# Patient Record
Sex: Male | Born: 1970 | Race: Black or African American | Hispanic: No | Marital: Married | Smoking: Former smoker
Health system: Southern US, Community
[De-identification: ages and names within clinical notes are randomized; demographics above are authoritative.]

## PROBLEM LIST (undated history)

## (undated) DIAGNOSIS — Z91018 Allergy to other foods: Secondary | ICD-10-CM

## (undated) DIAGNOSIS — G4730 Sleep apnea, unspecified: Secondary | ICD-10-CM

## (undated) DIAGNOSIS — M549 Dorsalgia, unspecified: Secondary | ICD-10-CM

## (undated) DIAGNOSIS — M1990 Unspecified osteoarthritis, unspecified site: Secondary | ICD-10-CM

## (undated) DIAGNOSIS — E785 Hyperlipidemia, unspecified: Secondary | ICD-10-CM

## (undated) DIAGNOSIS — R7303 Prediabetes: Secondary | ICD-10-CM

## (undated) DIAGNOSIS — R600 Localized edema: Secondary | ICD-10-CM

## (undated) DIAGNOSIS — T7840XA Allergy, unspecified, initial encounter: Secondary | ICD-10-CM

## (undated) DIAGNOSIS — I10 Essential (primary) hypertension: Secondary | ICD-10-CM

## (undated) DIAGNOSIS — L309 Dermatitis, unspecified: Secondary | ICD-10-CM

## (undated) HISTORY — DX: Prediabetes: R73.03

## (undated) HISTORY — PX: MOUTH SURGERY: SHX715

## (undated) HISTORY — DX: Sleep apnea, unspecified: G47.30

## (undated) HISTORY — DX: Morbid (severe) obesity due to excess calories: E66.01

## (undated) HISTORY — DX: Allergy to other foods: Z91.018

## (undated) HISTORY — DX: Allergy, unspecified, initial encounter: T78.40XA

## (undated) HISTORY — DX: Dermatitis, unspecified: L30.9

## (undated) HISTORY — DX: Unspecified osteoarthritis, unspecified site: M19.90

## (undated) HISTORY — DX: Localized edema: R60.0

## (undated) HISTORY — DX: Dorsalgia, unspecified: M54.9

## (undated) HISTORY — DX: Hyperlipidemia, unspecified: E78.5

## (undated) HISTORY — PX: NO PAST SURGERIES: SHX2092

---

## 1998-09-10 ENCOUNTER — Encounter: Payer: Self-pay | Admitting: Emergency Medicine

## 1998-09-10 ENCOUNTER — Emergency Department (HOSPITAL_COMMUNITY): Admission: EM | Admit: 1998-09-10 | Discharge: 1998-09-10 | Payer: Self-pay | Admitting: Emergency Medicine

## 1999-03-22 ENCOUNTER — Emergency Department (HOSPITAL_COMMUNITY): Admission: EM | Admit: 1999-03-22 | Discharge: 1999-03-22 | Payer: Self-pay | Admitting: Emergency Medicine

## 1999-05-30 ENCOUNTER — Emergency Department (HOSPITAL_COMMUNITY): Admission: EM | Admit: 1999-05-30 | Discharge: 1999-05-30 | Payer: Self-pay | Admitting: Emergency Medicine

## 2000-08-30 ENCOUNTER — Encounter: Payer: Self-pay | Admitting: Emergency Medicine

## 2000-08-30 ENCOUNTER — Emergency Department (HOSPITAL_COMMUNITY): Admission: EM | Admit: 2000-08-30 | Discharge: 2000-08-30 | Payer: Self-pay | Admitting: Emergency Medicine

## 2003-06-06 ENCOUNTER — Emergency Department (HOSPITAL_COMMUNITY): Admission: EM | Admit: 2003-06-06 | Discharge: 2003-06-06 | Payer: Self-pay | Admitting: Emergency Medicine

## 2004-02-11 ENCOUNTER — Emergency Department (HOSPITAL_COMMUNITY): Admission: EM | Admit: 2004-02-11 | Discharge: 2004-02-11 | Payer: Self-pay | Admitting: Family Medicine

## 2004-04-20 ENCOUNTER — Emergency Department (HOSPITAL_COMMUNITY): Admission: EM | Admit: 2004-04-20 | Discharge: 2004-04-20 | Payer: Self-pay

## 2004-10-03 ENCOUNTER — Ambulatory Visit (HOSPITAL_BASED_OUTPATIENT_CLINIC_OR_DEPARTMENT_OTHER): Admission: RE | Admit: 2004-10-03 | Discharge: 2004-10-03 | Payer: Self-pay | Admitting: Family Medicine

## 2004-10-03 ENCOUNTER — Ambulatory Visit: Payer: Self-pay | Admitting: Internal Medicine

## 2005-07-07 ENCOUNTER — Ambulatory Visit (HOSPITAL_BASED_OUTPATIENT_CLINIC_OR_DEPARTMENT_OTHER): Admission: RE | Admit: 2005-07-07 | Discharge: 2005-07-07 | Payer: Self-pay | Admitting: Family Medicine

## 2005-07-12 ENCOUNTER — Ambulatory Visit: Payer: Self-pay | Admitting: Internal Medicine

## 2005-09-27 ENCOUNTER — Emergency Department (HOSPITAL_COMMUNITY): Admission: EM | Admit: 2005-09-27 | Discharge: 2005-09-27 | Payer: Self-pay | Admitting: Emergency Medicine

## 2006-06-27 ENCOUNTER — Emergency Department (HOSPITAL_COMMUNITY): Admission: EM | Admit: 2006-06-27 | Discharge: 2006-06-27 | Payer: Self-pay | Admitting: Emergency Medicine

## 2007-02-21 ENCOUNTER — Emergency Department (HOSPITAL_COMMUNITY): Admission: EM | Admit: 2007-02-21 | Discharge: 2007-02-21 | Payer: Self-pay | Admitting: Emergency Medicine

## 2007-02-27 ENCOUNTER — Emergency Department (HOSPITAL_COMMUNITY): Admission: EM | Admit: 2007-02-27 | Discharge: 2007-02-28 | Payer: Self-pay | Admitting: Emergency Medicine

## 2007-09-19 IMAGING — CR DG CHEST 2V
2 series · 2 of 2 positions shown · non-contrast
Comparison: 06/27/06.

CLINICAL DATA: Short of breath. 
 CHEST - 2 VIEW:

[w chest pa]
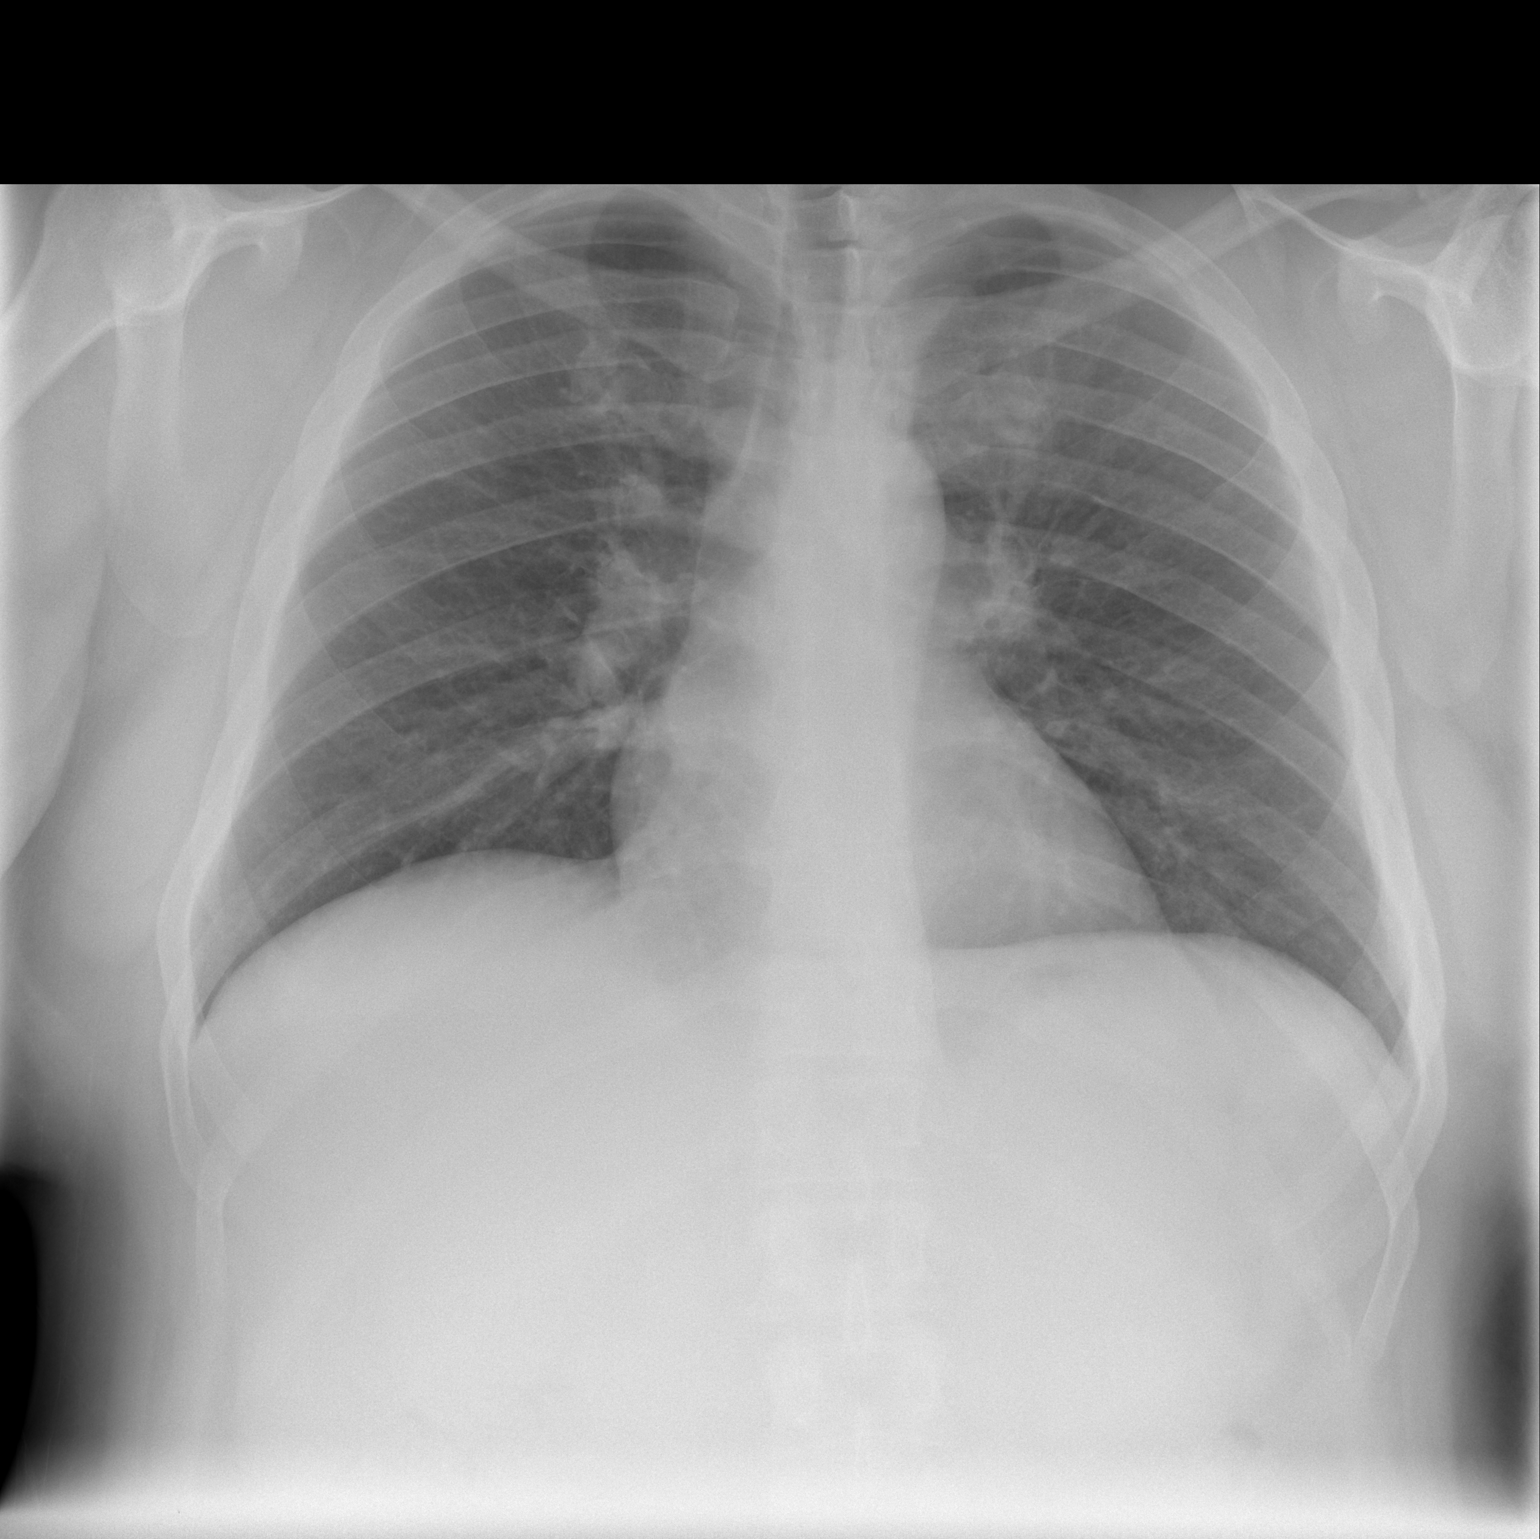

[w chest lat]
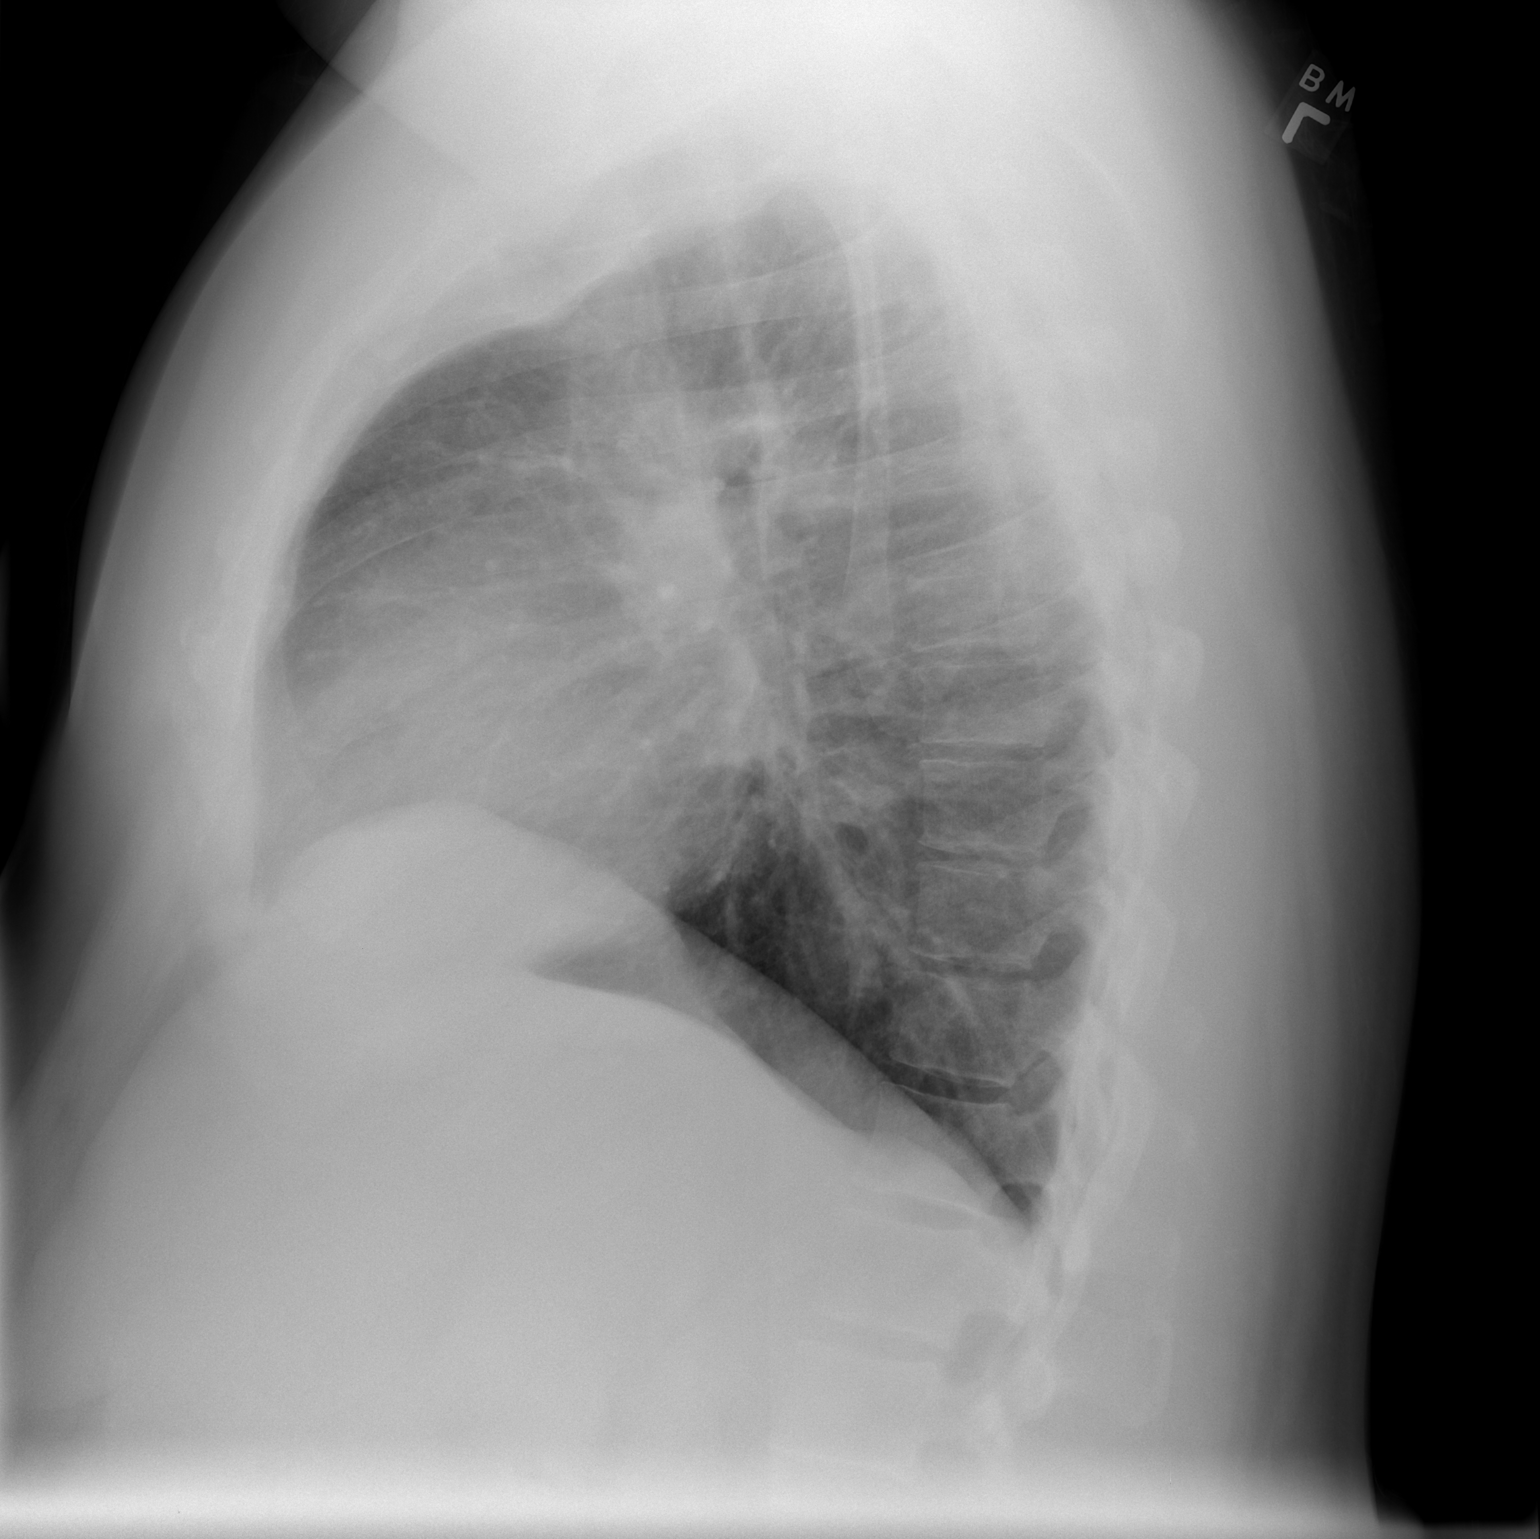

[2 of 2 positions shown; findings below may reference images not displayed]

FINDINGS: The heart size and mediastinal contours are within normal limits.  Both lungs are clear.  The visualized skeletal structures are within normal limits.
IMPRESSION: No active cardiopulmonary disease.

## 2010-04-08 ENCOUNTER — Ambulatory Visit (HOSPITAL_BASED_OUTPATIENT_CLINIC_OR_DEPARTMENT_OTHER): Admission: RE | Admit: 2010-04-08 | Discharge: 2010-04-08 | Payer: Self-pay | Admitting: Family Medicine

## 2010-04-13 ENCOUNTER — Ambulatory Visit: Payer: Self-pay | Admitting: Internal Medicine

## 2011-02-27 NOTE — Procedures (Signed)
NAME:  Troy Olson, Troy Olson                 ACCOUNT NO.:  [id number]   MEDICAL RECORD NO.:  [id number]          PATIENT TYPE:  OUT   LOCATION:  SLEEP CENTER                 FACILITY:  [facility]   PHYSICIAN:  Clinton D. Maple Hudson, M.D. DATE OF BIRTH:  [birth year]   DATE OF STUDY:  10/03/2004                              NOCTURNAL POLYSOMNOGRAM   INDICATIONS FOR STUDY:  Hypersomnia with sleep apnea. Epworth sleepiness  score 9/24, neck size 19 inches, BMI 42.3, weight 280 pounds.   SLEEP ARCHITECTURE:  Total sleep time 426 minutes with sleep efficiency of  93%. Stage I was 7%, stage II was 81%, stages III and IV were 3%, REM was  10% of total sleep time. Latency to sleep onset 9 minutes. Latency to REM 74  minutes. Awake after sleep onset 24 minutes. Arousal index 36. No sleep  medications.   RESPIRATORY DATA:  NPSG protocol. RDI 90.7 obstructive events per hour  indicating very severe obstructive sleep apnea/hypopnea syndrome. This  included 446 obstructive apneas, 198 hypopneas. Events were not positional  with most sleep on back and right side. REM RDI 94.3.   OXYGEN DATA:  Moderately loud snoring with oxygen desaturation to a nadir of  54% with apneas, mean oxygen saturation through the study was 82% to 90% on  room air.   CARDIAC DATA:  Normal sinus rhythm with occasional PVCs.   MOVEMENT/PARASOMNIA:  No significant sleep disturbance by limb jerks.  Bathroom times one.   IMPRESSION/RECOMMENDATIONS:  Very severe obstructive sleep apnea/hypopnea  syndrome, respiratory disturbance index 90.7 per hour with significant  oxygen desaturation to 54% on room air.  Consider early return for CPAP  titration or evaluation for alternative therapies as appropriate.                                                           Clinton D. Maple Hudson, M.D.  Diplomate, [employer] Board   CDY/MEDQ  D:  10/12/2004 12:06:39  T:  10/12/2004 13:59:08  Job:  [id number]

## 2011-02-27 NOTE — Procedures (Signed)
NAME:  Troy Olson, Troy Olson                 ACCOUNT NO.:  [id number]   MEDICAL RECORD NO.:  [id number]          PATIENT TYPE:  OUT   LOCATION:  SLEEP CENTER                 FACILITY:  [facility]   PHYSICIAN:  Clinton D. Maple Hudson, M.D. DATE OF BIRTH:  08-14-1971   DATE OF STUDY:  07/07/2005                              NOCTURNAL POLYSOMNOGRAM   REFERRING PHYSICIAN:  Dr. Blair Heys.   DATE OF STUDY:  July 07, 2005.   INDICATION FOR STUDY:  Hypersomnia with sleep apnea. Epworth sleepiness  score 11/24, BMI 45. Weight 300 pounds. Baseline diagnostic NPSG on October 03, 2004 reported an AHI of 90.7 per hour. C-PAP titration is requested.   SLEEP ARCHITECTURE:  Total sleep time 325 minutes with sleep efficiency 81%.  Stage I was 8%, stage II 46%, stages III and IV 12%, REM 34% of total sleep  time. Sleep latency 11 minutes, awake after sleep onset 67 minutes, arousal  index 29 per hour.   RESPIRATORY DATA:  C-PAP titration protocol. C-PAP was titrated to 22 CWP. A  pressure of 20 CWP appears adequate and provided an apnea/hypopnea index of  0 per hour. A large ResMed Ultra Mirage full face mask was used with heated  humidifier. He had problems with nasal dryness and congestion.   OXYGEN DATA:  Loud snoring before C-PAP. Snoring was prevented and oxygen  saturation was held 95-97% on room air with C-PAP control.   CARDIAC DATA:  Occasional PAC.   MOVEMENT/PARASOMNIA:  Occasional leg jerk with little effect on sleep.   IMPRESSION/RECOMMENDATIONS:  1.  Successful C-PAP titration to a recommended initial pressure of 20 CWP,      AHI 0 per hour. A large ResMed Ultra Mirage full face mask was used with      heated humidifier. He may need additional management of nasal      congestion.  2.  Baseline diagnostic NPSG on October 03, 2004 AHI 90.7 per hour.     Clinton D. Maple Hudson, M.D.  Diplomate, Biomedical engineer of Sleep Medicine  Electronically Signed    CDY/MEDQ  D:  07/12/2005 11:18:16  T:   07/12/2005 22:04:10  Job:  [id number]

## 2012-01-18 ENCOUNTER — Emergency Department (HOSPITAL_COMMUNITY)
Admission: EM | Admit: 2012-01-18 | Discharge: 2012-01-18 | Disposition: A | Discharge status: Home / Self Care | Payer: 59 | Attending: Emergency Medicine | Admitting: Emergency Medicine

## 2012-01-18 ENCOUNTER — Other Ambulatory Visit: Payer: Self-pay

## 2012-01-18 ENCOUNTER — Emergency Department (HOSPITAL_COMMUNITY): Payer: 59

## 2012-01-18 ENCOUNTER — Encounter (HOSPITAL_COMMUNITY): Payer: Self-pay | Admitting: *Deleted

## 2012-01-18 DIAGNOSIS — J3489 Other specified disorders of nose and nasal sinuses: Secondary | ICD-10-CM | POA: Insufficient documentation

## 2012-01-18 DIAGNOSIS — Z87891 Personal history of nicotine dependence: Secondary | ICD-10-CM | POA: Insufficient documentation

## 2012-01-18 DIAGNOSIS — R61 Generalized hyperhidrosis: Secondary | ICD-10-CM | POA: Insufficient documentation

## 2012-01-18 DIAGNOSIS — R079 Chest pain, unspecified: Secondary | ICD-10-CM | POA: Insufficient documentation

## 2012-01-18 DIAGNOSIS — R0602 Shortness of breath: Secondary | ICD-10-CM | POA: Insufficient documentation

## 2012-01-18 DIAGNOSIS — J45909 Unspecified asthma, uncomplicated: Secondary | ICD-10-CM | POA: Insufficient documentation

## 2012-01-18 DIAGNOSIS — L299 Pruritus, unspecified: Secondary | ICD-10-CM | POA: Insufficient documentation

## 2012-01-18 LAB — POCT I-STAT, CHEM 8
BUN: 11 mg/dL (ref 6–23)
Calcium, Ion: 1.15 mmol/L (ref 1.12–1.32)
Chloride: 106 mEq/L (ref 96–112)
Creatinine, Ser: 1 mg/dL (ref 0.50–1.35)
Glucose, Bld: 96 mg/dL (ref 70–99)
HCT: 42 % (ref 39.0–52.0)
Hemoglobin: 14.3 g/dL (ref 13.0–17.0)
Potassium: 3.3 mEq/L — ABNORMAL LOW (ref 3.5–5.1)
Sodium: 142 mEq/L (ref 135–145)
TCO2: 26 mmol/L (ref 0–100)

## 2012-01-18 LAB — POCT I-STAT TROPONIN I: Troponin i, poc: 0 ng/mL (ref 0.00–0.08)

## 2012-01-18 MED ORDER — ALBUTEROL SULFATE HFA 108 (90 BASE) MCG/ACT IN AERS
2.0000 | INHALATION_SPRAY | Freq: Once | RESPIRATORY_TRACT | Status: AC
  Administered 2012-01-18: 2 via RESPIRATORY_TRACT
  Filled 2012-01-18: qty 6.7

## 2012-01-18 NOTE — ED Notes (Signed)
Pt c/o on-going sharp, intermittent chest pain. Pt states last night he experienced profuse sweating and chest pain while playing a video game w/ son. Afterwards pt became nauseated and could not eat. Pt states this morning he just "feels worse than normal while driving to work". Pt states now he feels better than when he came into the ED.

## 2012-01-18 NOTE — ED Provider Notes (Signed)
History     CSN: [id number]  Arrival date & time 01/18/12  0601   None     Chief Complaint  Patient presents with  . Chest Pain    L ribs, sharp pain, intermittant    (Consider location/radiation/quality/duration/timing/severity/associated sxs/prior treatment) Patient is a 41 y.o. male presenting with shortness of breath. The history is provided by the patient.  Shortness of Breath  The current episode started more than 2 weeks ago. The problem occurs continuously. The problem has been gradually worsening. Associated symptoms include chest pain and shortness of breath. Pertinent negatives include no fever and no cough.  Pt states he has developed shortness of breath about a month ago. States feels short of breath at all times, feels like he has to take in deep breaths constantly to get enough air. Intermittently gets sharp pains in left ribs, non exertional, not associated with shortness of breath, dizziness, nausea, diaphoresis. Pt states he also had two episodes where he suddenly became diaphoretic, and nauseated, once last night, and once this morning. Both at rest, lasting few seconds- minutes and resolved on its own. Pt states he went to see his doctor when this started, had blood work done and was told everything was OK. Pt states he was exercising and doing "insanity" workouts prior to this, and now he is unable to do anything without getting short of breath. He has hx of seasonal allergies and asthma. Taking claritin,symbicort,  and albuterol daily  Past Medical History  Diagnosis Date  . Asthma     History reviewed. No pertinent past surgical history.  History reviewed. No pertinent family history.  History  Substance Use Topics  . Smoking status: Former Smoker    Quit date: 02/17/2011  . Smokeless tobacco: Not on file  . Alcohol Use: Yes     occasionally      Review of Systems  Constitutional: Positive for diaphoresis. Negative for fever and chills.  HENT: Positive  for congestion.   Eyes: Positive for itching.  Respiratory: Positive for shortness of breath. Negative for cough and chest tightness.   Cardiovascular: Positive for chest pain. Negative for leg swelling.  Gastrointestinal: Negative.   Genitourinary: Negative.   Musculoskeletal: Negative.   Skin: Negative.   Neurological: Negative.   Psychiatric/Behavioral: Negative.     Allergies  Food allergy formula and Peanut-containing drug products  Home Medications   Current Outpatient Rx  Name Route Sig Dispense Refill  . ALBUTEROL SULFATE HFA 108 (90 BASE) MCG/ACT IN AERS Inhalation Inhale 2 puffs into the lungs every 6 (six) hours as needed. wheezing    . BUDESONIDE-FORMOTEROL FUMARATE 160-4.5 MCG/ACT IN AERO Inhalation Inhale 2 puffs into the lungs 2 (two) times daily.    Marland Kitchen LORATADINE 10 MG PO TABS Oral Take 10 mg by mouth daily.      BP 138/65  Pulse 87  Temp(Src) 98.3 F (36.8 C) (Oral)  Resp 18  Ht 5\' 8"  (1.727 m)  Wt 285 lb (129.275 kg)  BMI 43.33 kg/m2  SpO2 100%  Physical Exam  Nursing note and vitals reviewed. Constitutional: He is oriented to person, place, and time. He appears well-developed and well-nourished. No distress.  HENT:  Head: Normocephalic and atraumatic.  Nose: Rhinorrhea present.  Mouth/Throat: Uvula is midline, oropharynx is clear and moist and mucous membranes are normal.  Eyes: Conjunctivae are normal. Pupils are equal, round, and reactive to light.  Neck: Normal range of motion. Neck supple.  Cardiovascular: Normal rate, regular rhythm and normal  heart sounds.   Pulmonary/Chest: Effort normal and breath sounds normal. No respiratory distress. He has no wheezes. He has no rales. He exhibits no tenderness.  Abdominal: Soft. Bowel sounds are normal. There is no tenderness.  Musculoskeletal: Normal range of motion. He exhibits no edema.  Lymphadenopathy:    He has no cervical adenopathy.  Neurological: He is alert and oriented to person, place, and  time.  Skin: Skin is warm and dry.  Psychiatric: He has a normal mood and affect.    ED Course  Procedures (including critical care time)  Pt with SOB. Pt appears very congested, unable to currently breathe through his nose. As I am examining, he takes deep breaths every few words. His vital signs are all within normal. He is PERC negative. Low risk for PE/DVT. He does have hx of asthma and seasonal allergies, and I think this is an exacerbation. Will check CXR, ecg, istat chem 8, troponin.   Date: 01/18/2012  Rate: 88  Rhythm: normal sinus rhythm  QRS Axis: left  Intervals: normal  ST/T Wave abnormalities: normal  Conduction Disutrbances:none  Narrative Interpretation:   Old EKG Reviewed: none available  Results for orders placed during the hospital encounter of 01/18/12  POCT I-STAT, CHEM 8      Component Value Range   Sodium 142  135 - 145 (mEq/L)   Potassium 3.3 (*) 3.5 - 5.1 (mEq/L)   Chloride 106  96 - 112 (mEq/L)   BUN 11  6 - 23 (mg/dL)   Creatinine, Ser 0.86  0.50 - 1.35 (mg/dL)   Glucose, Bld 96  70 - 99 (mg/dL)   Calcium, Ion 5.78  4.69 - 1.32 (mmol/L)   TCO2 26  0 - 100 (mmol/L)   Hemoglobin 14.3  13.0 - 17.0 (g/dL)   HCT 62.9  52.8 - 41.3 (%)  POCT I-STAT TROPONIN I      Component Value Range   Troponin i, poc 0.00  0.00 - 0.08 (ng/mL)   Comment 3            Dg Chest 2 View  01/18/2012  *RADIOLOGY REPORT*  Clinical Data: Left lateral chest pain for 2 days.  History of asthma.  CHEST - 2 VIEW  Comparison: 10/16/2010  Findings: Midline trachea.  Normal heart size and mediastinal contours. No pleural effusion or pneumothorax.  Clear lungs.  IMPRESSION: Normal chest.  Original Report Authenticated By: Consuello Bossier, M.D.   8:53 AM PT is CP free, VS normal, oxygen 100% on RA, he is in no respiratory distress. Do not think SOB is cardiac. He has no symptoms while exercising. Will d/c home with PCP follow up for possible stress test.   1. Chest pain       MDM           Lottie Mussel, PA 01/18/12 1600

## 2012-01-18 NOTE — Discharge Instructions (Signed)
Continue claritin and flonase daily. Make sure to eat healthy, exercise. Follow up with primary care doctor for further evaluation and treatment.  Chest Pain (Nonspecific) It is often hard to give a specific diagnosis for the cause of chest pain. There is always a chance that your pain could be related to something serious, such as a heart attack or a blood clot in the lungs. You need to follow up with your caregiver for further evaluation. CAUSES   Heartburn.   Pneumonia or bronchitis.   Anxiety or stress.   Inflammation around your heart (pericarditis) or lung (pleuritis or pleurisy).   A blood clot in the lung.   A collapsed lung (pneumothorax). It can develop suddenly on its own (spontaneous pneumothorax) or from injury (trauma) to the chest.   Shingles infection (herpes zoster virus).  The chest wall is composed of bones, muscles, and cartilage. Any of these can be the source of the pain.  The bones can be bruised by injury.   The muscles or cartilage can be strained by coughing or overwork.   The cartilage can be affected by inflammation and become sore (costochondritis).  DIAGNOSIS  Lab tests or other studies, such as X-rays, electrocardiography, stress testing, or cardiac imaging, may be needed to find the cause of your pain.  TREATMENT   Treatment depends on what may be causing your chest pain. Treatment may include:   Acid blockers for heartburn.   Anti-inflammatory medicine.   Pain medicine for inflammatory conditions.   Antibiotics if an infection is present.   You may be advised to change lifestyle habits. This includes stopping smoking and avoiding alcohol, caffeine, and chocolate.   You may be advised to keep your head raised (elevated) when sleeping. This reduces the chance of acid going backward from your stomach into your esophagus.   Most of the time, nonspecific chest pain will improve within 2 to 3 days with rest and mild pain medicine.  HOME CARE  INSTRUCTIONS   If antibiotics were prescribed, take your antibiotics as directed. Finish them even if you start to feel better.   For the next few days, avoid physical activities that bring on chest pain. Continue physical activities as directed.   Do not smoke.   Avoid drinking alcohol.   Only take over-the-counter or prescription medicine for pain, discomfort, or fever as directed by your caregiver.   Follow your caregiver's suggestions for further testing if your chest pain does not go away.   Keep any follow-up appointments you made. If you do not go to an appointment, you could develop lasting (chronic) problems with pain. If there is any problem keeping an appointment, you must call to reschedule.  SEEK MEDICAL CARE IF:   You think you are having problems from the medicine you are taking. Read your medicine instructions carefully.   Your chest pain does not go away, even after treatment.   You develop a rash with blisters on your chest.  SEEK IMMEDIATE MEDICAL CARE IF:   You have increased chest pain or pain that spreads to your arm, neck, jaw, back, or abdomen.   You develop shortness of breath, an increasing cough, or you are coughing up blood.   You have severe back or abdominal pain, feel nauseous, or vomit.   You develop severe weakness, fainting, or chills.   You have a fever.  THIS IS AN EMERGENCY. Do not wait to see if the pain will go away. Get medical help at once. Call  your local emergency services (911 in [place] Do not drive yourself to the hospital. MAKE SURE YOU:   Understand these instructions.   Will watch your condition.   Will get help right away if you are not doing well or get worse.  Document Released: 07/08/2005 Document Revised: 09/17/2011 Document Reviewed: 05/03/2008 [facility] Patient Information 2012 [place], [place].

## 2012-01-18 NOTE — ED Notes (Signed)
Bed:WA03<BR> Expected date:<BR> Expected time:<BR> Means of arrival:<BR> Comments:<BR> Closed 

## 2012-01-18 NOTE — ED Provider Notes (Signed)
Medical screening examination/treatment/procedure(s) were conducted as a shared visit with non-physician practitioner(s) and myself.  I personally evaluated the patient during the encounter  Patient with dyspnea times one month. No symptoms concerning for ACS. Patient to do his exercise without chest pain symptoms. Has had a nonproductive cough. Patient's EKG without concerning features. Chest x-ray negative. Will check troponin and hemoglobin is stable we'll refer back to his family care doctor  Toy Baker, MD 01/18/12 [phone]

## 2012-01-19 NOTE — ED Provider Notes (Signed)
Medical screening examination/treatment/procedure(s) were performed by non-physician practitioner and as supervising physician I was immediately available for consultation/collaboration.  Toy Baker, MD 01/19/12 [phone]

## 2012-02-19 ENCOUNTER — Institutional Professional Consult (permissible substitution): Payer: 59 | Admitting: Internal Medicine

## 2012-03-15 ENCOUNTER — Encounter: Payer: Self-pay | Admitting: Internal Medicine

## 2012-03-16 ENCOUNTER — Ambulatory Visit (INDEPENDENT_AMBULATORY_CARE_PROVIDER_SITE_OTHER): Payer: 59 | Admitting: Internal Medicine

## 2012-03-16 ENCOUNTER — Encounter: Payer: Self-pay | Admitting: Internal Medicine

## 2012-03-16 VITALS — BP 122/78 | HR 74 | Temp 98.2°F | Ht 68.0 in | Wt 288.8 lb

## 2012-03-16 DIAGNOSIS — J45909 Unspecified asthma, uncomplicated: Secondary | ICD-10-CM

## 2012-03-16 DIAGNOSIS — J4540 Moderate persistent asthma, uncomplicated: Secondary | ICD-10-CM

## 2012-03-16 DIAGNOSIS — R0600 Dyspnea, unspecified: Secondary | ICD-10-CM

## 2012-03-16 DIAGNOSIS — Z6841 Body Mass Index (BMI) 40.0 and over, adult: Secondary | ICD-10-CM

## 2012-03-16 NOTE — Assessment & Plan Note (Signed)
Would like to rule out or confirm asthma in first place. So, stop symbicort for 2 weeks and do methacholine challenge test. IF asthma present, then will advise on asthma mgmt strategies before intense exercise

## 2012-03-16 NOTE — Patient Instructions (Addendum)
#  Weight management  - Glad you are motivated in losing weight and getting fit - Buy yourself a chest strap heart rate monitor; use it for exercise. Do not let your heart rate go more than 170 per minute - I would advise you first develop discipline strategies to commit yourself to 150 minutes of exercise per week  - Do not push yourself too hard first 3 months. Remember this is a lifestyle change and permanent change  - this could be combination of aerobic and weight training  - for aerobic you atleast need 30 minutes x 3 days (90 minutes per week) or 45 minutes x 2 days  - using your heart rate monitor slowly build endurance  - any shortness of breath or chest pain stop  - you can take some albuterol ihaler 20 minutes before you exercise  - always do 5-10 minutes of warm up and cool down before exercising (independent of exercise time) - Diet is critical; nurse will show the low glycemic diet that I personally follow  #Asthma Hold off symbicort for 2 weeks and do breathing test called full methacholine chalenge test; once done I will call you with results and decide on asthma treatment  #Followup  - depending of methacholine challenge test result

## 2012-03-16 NOTE — Assessment & Plan Note (Signed)
#  Weight management  - Glad you are motivated in losing weight and getting fit - Buy yourself a chest strap heart rate monitor; use it for exercise. Do not let your heart rate go more than 170 per minute - I would advise you first develop discipline strategies to commit yourself to 150 minutes of exercise per week  - Do not push yourself too hard first 3 months. Remember this is a lifestyle change and permanent change  - this could be combination of aerobic and weight training  - for aerobic you atleast need 30 minutes x 3 days (90 minutes per week) or 45 minutes x 2 days  - using your heart rate monitor slowly build endurance  - any shortness of breath or chest pain stop  - you can take some albuterol ihaler 20 minutes before you exercise  - always do 5-10 minutes of warm up and cool down before exercising (independent of exercise time)  - once asthma ruled out or if confirmed, well controlled and some endurance built over some months, start INSANITY workout  - Diet is critical; nurse will show the low glycemic diet that I personally follow

## 2012-03-16 NOTE — Progress Notes (Signed)
Subjective:    Patient ID: Troy Olson, male    DOB: [birth year], 41 y.o.   MRN: [id number]  HPI   41 year old male. Body mass index is 43.91 kg/(m^2).  reports that he quit smoking about a year ago. His smoking use included Cigarettes. He has a 10 pack-year smoking history. He does not have any smokeless tobacco history on file. PCP is Alva Garnet., MD, MD   IOV 03/16/2012  Metabolich Syndrome: Morbidly obese. Body mass index is 43.91 kg/(m^2). with stable weigh x 5 years. Hyperlipidemia. Pre-diabetic . Visceral obesity   Previous smoker. STarted at age 31. Quit age 89 in May 2012. Smoked 1/2 ppd.  Since quitting smoking May 2012 no weight gain   Known to have asthma from birth.  At baseline never on maintenance Rx  Due to asymptomatic state. Only time he would have symptoms (wheezing, dyspnea, congestion and itchy eyes) would be after URI, or getting near cats for which he would do rescue albuterol. He reports that at baseline atleast 3 times a year he would get URIs but he quit smoking May 2012 and since then no URI.   Reports he is trying to lose weight and started INSANITY exercise regimen 3 months ago and within 2 weeks lost 8# and after that picked up an URI and after that he started have wheezing and after that with INSANITY workout he would notice increased dyspnea and so stopped it within few days. At thtat time feb 2013, started symbicort 160/4.5  2 puff bid which he says he is diligent about. Currently not exercising and feeling baseline which is no dyspnea with ADL. Currently no wheezing, no nocturnal awakenings and no albuterol use.  He essentially wants to know if it is safe to exercise. He also wants to ensure that all of dyspnea is explained by obesity.  Only weight loss regimen is new plant based diet of Dr Renae Gloss x 1 week    CXR 02/07/12 RADIOLOGY REPORT*  Clinical Data: Left lateral chest pain for 2 days. History of  asthma.  CHEST - 2 VIEW  Comparison: 10/16/2010   Findings: Midline trachea. Normal heart size and mediastinal  contours. No pleural effusion or pneumothorax. Clear lungs.  IMPRESSION:  Normal chest.  Original Report Authenticated By: Consuello Bossier, M.D.      Labs from PMD office 01/22/12  TSH 0.7  HgbA1C 5.9 Alb 4.9 BUN 10 hgb 14.9gm%  Past Medical History  Diagnosis Date  . Asthma   . Sleep apnea      Family History  Problem Relation Age of Onset  . Hypertension Father     stents placed  . Cancer Sister      History   Social History  . Marital Status: Single    Spouse Name: N/A    Number of Children: N/A  . Years of Education: N/A   Occupational History  . Not on file.   Social History Main Topics  . Smoking status: Former Smoker -- 0.5 packs/day for 20 years    Types: Cigarettes    Quit date: 03/13/2011  . Smokeless tobacco: Not on file  . Alcohol Use: Yes     occasionally--dark liquer 4-5 drinks consumed weekly  . Drug Use: No  . Sexually Active: Yes    Birth Control/ Protection: None   Other Topics Concern  . Not on file   Social History Narrative  . No narrative on file     Allergies  Allergen Reactions  .  Food Allergy Formula     Any nuts of any kind.  . Peanut-Containing Drug Products      Outpatient Prescriptions Prior to Visit  Medication Sig Dispense Refill  . albuterol (PROVENTIL HFA;VENTOLIN HFA) 108 (90 BASE) MCG/ACT inhaler Inhale 2 puffs into the lungs every 6 (six) hours as needed. wheezing      . budesonide-formoterol (SYMBICORT) 160-4.5 MCG/ACT inhaler Inhale 2 puffs into the lungs 2 (two) times daily.      Marland Kitchen loratadine (CLARITIN) 10 MG tablet Take 10 mg by mouth daily.           Review of Systems  Constitutional: Negative for fever and unexpected weight change.  HENT: Negative for ear pain, nosebleeds, congestion, sore throat, rhinorrhea, sneezing, trouble swallowing, dental problem, postnasal drip and sinus pressure.   Eyes: Negative for redness and itching.    Respiratory: Positive for shortness of breath. Negative for cough, chest tightness and wheezing.   Cardiovascular: Negative for palpitations and leg swelling.  Gastrointestinal: Negative for nausea and vomiting.  Genitourinary: Negative for dysuria.  Musculoskeletal: Negative for joint swelling.  Skin: Negative for rash.  Neurological: Negative for headaches.  Hematological: Does not bruise/bleed easily.  Psychiatric/Behavioral: Negative for dysphoric mood. The patient is not nervous/anxious.        Objective:   Physical Exam  Nursing note and vitals reviewed. Constitutional: He is oriented to person, place, and time. He appears well-developed and well-nourished. No distress.       Body mass index is 43.91 kg/(m^2).   HENT:  Head: Normocephalic and atraumatic.  Right Ear: External ear normal.  Left Ear: External ear normal.  Mouth/Throat: Oropharynx is clear and moist. No oropharyngeal exudate.  Eyes: Conjunctivae and EOM are normal. Pupils are equal, round, and reactive to light. Right eye exhibits no discharge. Left eye exhibits no discharge. No scleral icterus.  Neck: Normal range of motion. Neck supple. No JVD present. No tracheal deviation present. No thyromegaly present.  Cardiovascular: Normal rate, regular rhythm and intact distal pulses.  Exam reveals no gallop and no friction rub.   No murmur heard. Pulmonary/Chest: Effort normal and breath sounds normal. No respiratory distress. He has no wheezes. He has no rales. He exhibits no tenderness.  Abdominal: Soft. Bowel sounds are normal. He exhibits no distension and no mass. There is no tenderness. There is no rebound and no guarding.  Musculoskeletal: Normal range of motion. He exhibits no edema and no tenderness.  Lymphadenopathy:    He has no cervical adenopathy.  Neurological: He is alert and oriented to person, place, and time. He has normal reflexes. No cranial nerve deficit. Coordination normal.  Skin: Skin is warm  and dry. No rash noted. He is not diaphoretic. No erythema. No pallor.  Psychiatric: He has a normal mood and affect. His behavior is normal. Judgment and thought content normal.          Assessment & Plan:

## 2012-03-30 ENCOUNTER — Ambulatory Visit (INDEPENDENT_AMBULATORY_CARE_PROVIDER_SITE_OTHER): Payer: 59 | Admitting: Internal Medicine

## 2012-03-30 DIAGNOSIS — J45909 Unspecified asthma, uncomplicated: Secondary | ICD-10-CM

## 2012-03-30 DIAGNOSIS — J4540 Moderate persistent asthma, uncomplicated: Secondary | ICD-10-CM

## 2012-03-30 LAB — PULMONARY FUNCTION TEST

## 2012-03-30 NOTE — Progress Notes (Signed)
PFT done today. 

## 2012-04-01 ENCOUNTER — Encounter (HOSPITAL_COMMUNITY): Payer: 59

## 2012-04-21 ENCOUNTER — Telehealth: Payer: Self-pay | Admitting: Internal Medicine

## 2012-04-21 NOTE — Telephone Encounter (Signed)
Pt aware of recs. rx sent for albuterol. appt made for 06-02-12. Carron Curie, CMA

## 2012-04-21 NOTE — Telephone Encounter (Signed)
pft 03/30/12 _ shows asthma. Please have him start symbicort 80/4.5 2 puff twice daily (send escript if needed) and a week later he can start work outs. Should also use albuterol (ensure script) 10 mint before exercise and do warm up and cool down after exercises.  See me end august /early sept with spiromety at [facility]    For my use  - fev1 2.58L/68%, 11% BD response, Ratio 63. TLC 95%, DLCO 33/96%

## 2012-05-05 ENCOUNTER — Encounter: Payer: Self-pay | Admitting: Internal Medicine

## 2012-06-02 ENCOUNTER — Ambulatory Visit: Payer: 59 | Admitting: Internal Medicine

## 2012-06-18 ENCOUNTER — Emergency Department: Payer: Self-pay | Admitting: Emergency Medicine

## 2012-06-18 LAB — BASIC METABOLIC PANEL
Anion Gap: 9 (ref 7–16)
BUN: 14 mg/dL (ref 7–18)
Calcium, Total: 9.7 mg/dL (ref 8.5–10.1)
Chloride: 108 mmol/L — ABNORMAL HIGH (ref 98–107)
Co2: 25 mmol/L (ref 21–32)
Creatinine: 1.15 mg/dL (ref 0.60–1.30)
EGFR (African American): >60
EGFR (Non-African Amer.): >60
Glucose: 97 mg/dL (ref 65–99)
Osmolality: 284 (ref 275–301)
Potassium: 3.4 mmol/L — ABNORMAL LOW (ref 3.5–5.1)
Sodium: 142 mmol/L (ref 136–145)

## 2012-06-18 LAB — CBC
HCT: 40.9 % (ref 40.0–52.0)
HGB: 14 g/dL (ref 13.0–18.0)
MCH: 29.2 pg (ref 26.0–34.0)
MCHC: 34.1 g/dL (ref 32.0–36.0)
MCV: 86 fL (ref 80–100)
Platelet: 240 10*3/uL (ref 150–440)
RBC: 4.78 10*6/uL (ref 4.40–5.90)
RDW: 14.2 % (ref 11.5–14.5)
WBC: 6.6 10*3/uL (ref 3.8–10.6)

## 2012-06-18 LAB — TROPONIN I: Troponin-I: <0.02 ng/mL

## 2012-06-18 LAB — CK-MB: CK-MB: 1 ng/mL (ref 0.5–3.6)

## 2013-08-25 ENCOUNTER — Emergency Department: Payer: Self-pay

## 2013-08-25 LAB — CBC
HCT: 43.7 % (ref 40.0–52.0)
HGB: 14.7 g/dL (ref 13.0–18.0)
MCH: 28.8 pg (ref 26.0–34.0)
MCHC: 33.7 g/dL (ref 32.0–36.0)
MCV: 86 fL (ref 80–100)
Platelet: 247 10*3/uL (ref 150–440)
RBC: 5.11 10*6/uL (ref 4.40–5.90)
RDW: 14.1 % (ref 11.5–14.5)
WBC: 15 10*3/uL — ABNORMAL HIGH (ref 3.8–10.6)

## 2013-08-25 LAB — BASIC METABOLIC PANEL
Anion Gap: 6 — ABNORMAL LOW (ref 7–16)
BUN: 16 mg/dL (ref 7–18)
Calcium, Total: 9 mg/dL (ref 8.5–10.1)
Chloride: 107 mmol/L (ref 98–107)
Co2: 27 mmol/L (ref 21–32)
Creatinine: 1.09 mg/dL (ref 0.60–1.30)
EGFR (African American): >60
EGFR (Non-African Amer.): >60
Glucose: 144 mg/dL — ABNORMAL HIGH (ref 65–99)
Osmolality: 283 (ref 275–301)
Potassium: 3.5 mmol/L (ref 3.5–5.1)
Sodium: 140 mmol/L (ref 136–145)

## 2016-03-25 ENCOUNTER — Telehealth: Payer: Self-pay

## 2016-03-25 NOTE — Telephone Encounter (Signed)
Pt needs a copy for his new job of [employer] from January 2016.  Please advise  [phone]

## 2016-06-16 ENCOUNTER — Emergency Department (HOSPITAL_COMMUNITY): Payer: 59

## 2016-06-16 ENCOUNTER — Encounter (HOSPITAL_COMMUNITY): Payer: Self-pay | Admitting: Emergency Medicine

## 2016-06-16 ENCOUNTER — Emergency Department (HOSPITAL_COMMUNITY)
Admission: EM | Admit: 2016-06-16 | Discharge: 2016-06-16 | Disposition: A | Discharge status: Home / Self Care | Payer: 59 | Attending: Physician Assistant | Admitting: Physician Assistant

## 2016-06-16 DIAGNOSIS — Z91010 Allergy to peanuts: Secondary | ICD-10-CM | POA: Insufficient documentation

## 2016-06-16 DIAGNOSIS — Z87891 Personal history of nicotine dependence: Secondary | ICD-10-CM | POA: Diagnosis not present

## 2016-06-16 DIAGNOSIS — R0602 Shortness of breath: Secondary | ICD-10-CM | POA: Insufficient documentation

## 2016-06-16 DIAGNOSIS — R0789 Other chest pain: Secondary | ICD-10-CM | POA: Insufficient documentation

## 2016-06-16 DIAGNOSIS — I10 Essential (primary) hypertension: Secondary | ICD-10-CM | POA: Diagnosis not present

## 2016-06-16 DIAGNOSIS — J4540 Moderate persistent asthma, uncomplicated: Secondary | ICD-10-CM | POA: Diagnosis not present

## 2016-06-16 DIAGNOSIS — Z79899 Other long term (current) drug therapy: Secondary | ICD-10-CM | POA: Diagnosis not present

## 2016-06-16 HISTORY — DX: Essential (primary) hypertension: I10

## 2016-06-16 LAB — BASIC METABOLIC PANEL
Anion gap: 9 (ref 5–15)
BUN: 9 mg/dL (ref 6–20)
CO2: 26 mmol/L (ref 22–32)
Calcium: 9.8 mg/dL (ref 8.9–10.3)
Chloride: 103 mmol/L (ref 101–111)
Creatinine, Ser: 1.02 mg/dL (ref 0.61–1.24)
GFR calc Af Amer: >60 mL/min (ref >60)
GFR calc non Af Amer: >60 mL/min (ref >60)
Glucose, Bld: 102 mg/dL — ABNORMAL HIGH (ref 65–99)
Potassium: 3.5 mmol/L (ref 3.5–5.1)
Sodium: 138 mmol/L (ref 135–145)

## 2016-06-16 LAB — CBC
HCT: 41.5 % (ref 39.0–52.0)
Hemoglobin: 13.7 g/dL (ref 13.0–17.0)
MCH: 27.1 pg (ref 26.0–34.0)
MCHC: 33 g/dL (ref 30.0–36.0)
MCV: 82 fL (ref 78.0–100.0)
Platelets: 237 10*3/uL (ref 150–400)
RBC: 5.06 MIL/uL (ref 4.22–5.81)
RDW: 15 % (ref 11.5–15.5)
WBC: 5.5 10*3/uL (ref 4.0–10.5)

## 2016-06-16 LAB — I-STAT TROPONIN, ED
Troponin i, poc: 0.01 ng/mL (ref 0.00–0.08)
Troponin i, poc: 0 ng/mL (ref 0.00–0.08)

## 2016-06-16 MED ORDER — HYDROXYZINE HCL 25 MG PO TABS: 25.0000 mg | ORAL_TABLET | Freq: Four times a day (QID) | ORAL | 0 refills | Status: DC

## 2016-06-16 MED ORDER — IOPAMIDOL (ISOVUE-370) INJECTION 76%
100.0000 mL | Freq: Once | INTRAVENOUS | Status: AC | PRN
  Administered 2016-06-16: 100 mL via INTRAVENOUS

## 2016-06-16 NOTE — ED Triage Notes (Signed)
Patient reports shortness of breath and "left-sided chest pull" starting about 1 hour ago. Denies nausea, vomiting.

## 2016-06-16 NOTE — Discharge Instructions (Addendum)
Your CT and chest x-ray and EKG were reassuring for your shortness of breath. Please take hydroxyzine to help with her itching.  You do have a dark spot on your liver that you will need to follow up with your primary care physcian for liver CT or MRI.

## 2016-06-16 NOTE — ED Notes (Signed)
Radiology transporting pt to X-ray.  Will draw blood after pt returns.

## 2016-06-16 NOTE — ED Notes (Signed)
PT DISCHARGED. INSTRUCTIONS AND PRESCRIPTION GIVEN. AAOX4. PT IN NO APPARENT DISTRESS OR PAIN. THE OPPORTUNITY TO ASK QUESTIONS WAS PROVIDED. 

## 2016-06-16 NOTE — ED Provider Notes (Signed)
[facility]-EMERGENCY DEPT Provider Note   CSN: [id number] Arrival date & time: 06/16/16  0915     History   Chief Complaint Chief Complaint  Patient presents with  . Shortness of Breath  . Chest Pain    HPI Troy Olson is a 45 y.o. male.   Shortness of Breath  This is a new problem. The problem occurs intermittently.The current episode started 3 to 5 hours ago. The problem has been gradually improving. Associated symptoms include wheezing and chest pain. Pertinent negatives include no fever. The problem's precipitants include exercise. Risk factors include recent prolonged sitting. He has tried nothing for the symptoms. The treatment provided no relief. He has had no prior hospitalizations. He has had no prior ED visits. He has had no prior ICU admissions. Associated medical issues include asthma.  Chest Pain   Associated symptoms include shortness of breath. Pertinent negatives include no dizziness and no fever.    Past Medical History:  Diagnosis Date  . Asthma   . Hypertension   . Sleep apnea     Patient Active Problem List   Diagnosis Date Noted  . Moderate persistent asthma 03/16/2012  . Morbid obesity with BMI of 40.0-44.9, adult (HCC) 03/16/2012    Past Surgical History:  Procedure Laterality Date  . NO PAST SURGERIES         Home Medications    Prior to Admission medications   Medication Sig Start Date End Date Taking? Authorizing Provider  albuterol (PROVENTIL HFA;VENTOLIN HFA) 108 (90 BASE) MCG/ACT inhaler Inhale 2 puffs into the lungs every 6 (six) hours as needed. wheezing   Yes Historical Provider, MD  budesonide-formoterol (SYMBICORT) 160-4.5 MCG/ACT inhaler Inhale 2 puffs into the lungs 2 (two) times daily.   Yes Historical Provider, MD  Cholecalciferol 4000 units CAPS Take 4,000 Units by mouth daily.   Yes Historical Provider, MD  Cyanocobalamin (B-12 PO) Take 1 tablet by mouth daily.   Yes Historical Provider, MD  hydrochlorothiazide (HYDRODIURIL)  12.5 MG tablet Take 12.5 mg by mouth daily.   Yes Historical Provider, MD  loratadine (CLARITIN) 10 MG tablet Take 10 mg by mouth daily as needed for allergies.    Yes Historical Provider, MD  montelukast (SINGULAIR) 10 MG tablet Take 10 mg by mouth daily.   Yes Historical Provider, MD  Multiple Vitamins-Minerals (MULTIVITAMIN WITH MINERALS) tablet Take 1 tablet by mouth daily.   Yes Historical Provider, MD  pravastatin (PRAVACHOL) 40 MG tablet Take 40 mg by mouth daily.   Yes Historical Provider, MD  hydrOXYzine (ATARAX/VISTARIL) 25 MG tablet Take 1 tablet (25 mg total) by mouth every 6 (six) hours. 06/16/16   Nobuko Gsell Lyn Javeria Briski, MD    Family History Family History  Problem Relation Age of Onset  . Hypertension Father     stents placed  . Cancer Sister     Social History Social History  Substance Use Topics  . Smoking status: Former Smoker    Packs/day: 0.50    Years: 20.00    Types: Cigarettes    Quit date: 03/13/2011  . Smokeless tobacco: Never Used  . Alcohol use Yes     Comment: occasionally--dark liquer 4-5 drinks consumed weekly     Allergies   Food allergy formula and Peanut-containing drug products   Review of Systems Review of Systems  Constitutional: Negative for activity change, fatigue and fever.  Respiratory: Positive for shortness of breath and wheezing.   Cardiovascular: Positive for chest pain.  Neurological: Negative for dizziness and  syncope.  All other systems reviewed and are negative.    Physical Exam Updated Vital Signs BP 130/94 (BP Location: Right Arm)   Pulse 81   Temp 98.6 F (37 C) (Oral)   Resp 11   Ht 5\' 8"  (1.727 m)   Wt (!) 310 lb (140.6 kg)   SpO2 100%   BMI 47.14 kg/m   Physical Exam  Constitutional: He is oriented to person, place, and time. He appears well-nourished.  HENT:  Head: Normocephalic.  Mouth/Throat: Oropharynx is clear and moist.  Eyes: Conjunctivae are normal.  Neck: No tracheal deviation present.    Cardiovascular: Normal rate.   Pulmonary/Chest: Effort normal. No stridor. No respiratory distress. He has no wheezes. He exhibits no tenderness.  Abdominal: Soft. There is no tenderness. There is no guarding.  Musculoskeletal: Normal range of motion. He exhibits no edema.  Neurological: He is oriented to person, place, and time. No cranial nerve deficit.  Skin: Skin is warm and dry. No rash noted. He is not diaphoretic.  Psychiatric: He has a normal mood and affect. His behavior is normal.  Nursing note and vitals reviewed.    ED Treatments / Results  Labs (all labs ordered are listed, but only abnormal results are displayed) Labs Reviewed  BASIC METABOLIC PANEL - Abnormal; Notable for the following:       Result Value   Glucose, Bld 102 (*)    All other components within normal limits  CBC  I-STAT TROPOININ, ED  I-STAT TROPOININ, ED  Rosezena SensorI-STAT TROPOININ, ED    EKG  EKG Interpretation  Date/Time:  Tuesday June 16 2016 09:25:57 EDT Ventricular Rate:  83 PR Interval:    QRS Duration: 97 QT Interval:  353 QTC Calculation: 415 R Axis:   -25 Text Interpretation:  Sinus rhythm Borderline left axis deviation No significant change since last tracing Confirmed by Kandis MannanMACKUEN, COURTNEY ([id number]) on 06/16/2016 9:29:56 AM       Radiology Dg Chest 2 View  Result Date: 06/16/2016 CLINICAL DATA:  Shortness of breath for 1 day EXAM: CHEST  2 VIEW COMPARISON:  June 18, 2012 FINDINGS: There is no edema or consolidation. Heart size is normal. The pulmonary vascularity appears normal. There is prominence in the right hilar region compared to prior study. No bone lesions evident. No pneumothorax. IMPRESSION: Right hilar prominence. Question vascular prominence versus localized lymph node enlargement. Given this apparent change from prior study, contrast enhanced chest CT advised to further evaluate. Lungs are clear. No other findings concerning for potential adenopathy appreciable.  Electronically Signed   By: Bretta BangWilliam  Woodruff III M.D.   On: 06/16/2016 09:39   Ct Angio Chest Pe W And/or Wo Contrast  Result Date: 06/16/2016 CLINICAL DATA:  Shortness of breath, chest pain EXAM: CT ANGIOGRAPHY CHEST WITH CONTRAST TECHNIQUE: Multidetector CT imaging of the chest was performed using the standard protocol during bolus administration of intravenous contrast. Multiplanar CT image reconstructions and MIPs were obtained to evaluate the vascular anatomy. CONTRAST:  100 cc Isovue 370 IV COMPARISON:  Chest x-ray earlier today FINDINGS: Cardiovascular: No filling defects in the pulmonary arteries to suggest pulmonary emboli. Heart is normal size. Aorta is normal caliber. Mediastinum/Nodes: There are enlarged mediastinal and bilateral hilar lymph nodes. Right paratracheal node has a short axis diameter of 15 mm on image 33. Prevascular node has a short axis diameter of 15 mm on image 35. Right hilar lymph node has a short axis diameter of 14 mm on image 45. Left hilar lymph node  has a short axis diameter of 13 mm on image 47. Subcarinal adenopathy also noted. Lungs/Pleura: Lungs are clear. No focal airspace opacities or suspicious nodules. No effusions. Upper Abdomen: Low-density area noted within the caudate lobe, difficult to visualize on this CTA chest. This measures approximately 3.9 cm on image 84. No definite other focal hepatic abnormality. Musculoskeletal: Chest wall soft tissues are unremarkable. No acute bony abnormality or focal bone lesion. Review of the MIP images confirms the above findings. IMPRESSION: No evidence of pulmonary embolus. Mediastinal and bilateral hilar adenopathy. Differential considerations would include lymphoproliferative disorder, metastatic adenopathy, or less likely sarcoidosis. Nonspecific low-density area posteriorly in the caudate lobe of the liver, difficult to visualize and characterize on this CTA chest. Consider further evaluation with liver protocol MRI or CT.  Electronically Signed   By: Charlett Nose M.D.   On: 06/16/2016 12:28    Procedures Procedures (including critical care time)  Medications Ordered in ED Medications  iopamidol (ISOVUE-370) 76 % injection 100 mL (100 mLs Intravenous Contrast Given 06/16/16 1209)     Initial Impression / Assessment and Plan / ED Course  I have reviewed the triage vital signs and the nursing notes.  Pertinent labs & imaging results that were available during my care of the patient were reviewed by me and considered in my medical decision making (see chart for details).  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 Chest xray shows ? Hilar fullness, was going to get d dimer anyway given profession and symtpms.  Will get CT chest.  Therwise, 100% on RA.    2:54 PM CT negative, Delta trop negative.  Will have him follow up with PCP.  Patient is comfortable, ambulatory, and taking PO at time of discharge.  Patient expressed understanding about return precautions.     Final Clinical Impressions(s) / ED Diagnoses   Final diagnoses:  SOB (shortness of breath)    New Prescriptions New Prescriptions   HYDROXYZINE (ATARAX/VISTARIL) 25 MG TABLET    Take 1 tablet (25 mg total) by mouth every 6 (six) hours.     Chiniqua Kilcrease Randall An, MD 06/16/16 1454

## 2016-06-16 NOTE — ED Notes (Signed)
IV attempt successful but blood was not obtained.  Pt also needs a large gauge IV.  Another RN to attempt for 20g IV and blood draw.

## 2017-10-08 ENCOUNTER — Encounter: Payer: Self-pay | Admitting: *Deleted

## 2017-10-11 ENCOUNTER — Encounter: Payer: Self-pay | Admitting: General Surgery

## 2017-10-11 ENCOUNTER — Ambulatory Visit: Payer: 59 | Admitting: General Surgery

## 2017-10-11 VITALS — BP 130/68 | HR 82 | Resp 14 | Ht 68.0 in | Wt 280.0 lb

## 2017-10-11 DIAGNOSIS — L02412 Cutaneous abscess of left axilla: Secondary | ICD-10-CM | POA: Diagnosis not present

## 2017-10-11 NOTE — Patient Instructions (Addendum)
Patient to return for excision left axilla. The patient is aware to use a heating pad as needed for comfort.The patient is aware to call back for any questions or concerns.

## 2017-10-11 NOTE — Progress Notes (Signed)
Patient ID: Troy Olson, male   DOB: 26-Jan-1971, 46 y.o.   MRN: [id number]  Chief Complaint  Patient presents with  . Cyst    HPI Troy Olson is a 46 y.o. male here today for a evaluation of a cyst under his left axilla. He states the area has been there for six years. Last week he states the area blow up and they drained that area. Finished his Sulfamethoxazole medication on 10/08/2017.  The patient reports no history of diabetes.  HPI  Past Medical History:  Diagnosis Date  . Asthma   . Hypertension   . Sleep apnea     Past Surgical History:  Procedure Laterality Date  . NO PAST SURGERIES      Family History  Problem Relation Age of Onset  . Hypertension Father        stents placed  . Cancer Sister     Social History Social History   Tobacco Use  . Smoking status: Former Smoker    Packs/day: 0.50    Years: 20.00    Pack years: 10.00    Types: Cigarettes    Last attempt to quit: 03/13/2011    Years since quitting: 6.5  . Smokeless tobacco: Never Used  Substance Use Topics  . Alcohol use: Yes    Comment: occasionally--dark liquer 4-5 drinks consumed weekly  . Drug use: No    Allergies  Allergen Reactions  . Food Allergy Formula Shortness Of Breath and Swelling    Any nuts of any kind.  . Peanut-Containing Drug Products Shortness Of Breath, Diarrhea, Nausea And Vomiting and Swelling    Current Outpatient Medications  Medication Sig Dispense Refill  . albuterol (PROVENTIL HFA;VENTOLIN HFA) 108 (90 BASE) MCG/ACT inhaler Inhale 2 puffs into the lungs every 6 (six) hours as needed. wheezing    . Cyanocobalamin (B-12 PO) Take 1 tablet by mouth daily.    . hydrochlorothiazide (HYDRODIURIL) 12.5 MG tablet Take 12.5 mg by mouth daily.    . hydrOXYzine (ATARAX/VISTARIL) 25 MG tablet Take 1 tablet (25 mg total) by mouth every 6 (six) hours. 12 tablet 0  . loratadine (CLARITIN) 10 MG tablet Take 10 mg by mouth daily as needed for allergies.     . montelukast  (SINGULAIR) 10 MG tablet Take 10 mg by mouth daily.    . Multiple Vitamins-Minerals (MULTIVITAMIN WITH MINERALS) tablet Take 1 tablet by mouth daily.    . pravastatin (PRAVACHOL) 40 MG tablet Take 40 mg by mouth daily.     No current facility-administered medications for this visit.     Review of Systems Review of Systems  Constitutional: Negative.   Respiratory: Negative.   Cardiovascular: Negative.     Blood pressure 130/68, pulse 82, resp. rate 14, height 5\' 8"  (1.727 m), weight 280 lb (127 kg).  Physical Exam Physical Exam  Constitutional: He is oriented to person, place, and time. He appears well-developed and well-nourished.  Cardiovascular: Normal rate, regular rhythm and normal heart sounds.  Pulmonary/Chest: Effort normal and breath sounds normal.  Neurological: He is alert and oriented to person, place, and time.  Skin: Skin is warm and dry.    Data Reviewed Laboratory studies from April 2013 through September 2017 revealed modest elevation of the serum glucose in 2014 and 2017. Procedure note from October 02, 2017 where an incision and drainage was completed and Bactrim DS provided was reviewed.  Assessment    Resolving infected sebaceous cyst of the left axilla.    Plan  I think this area is going to undergo further resolution, and will defer excision of the residual core for 10 days to minimize destruction of normal tissue.  The patient was encouraged to call early if he notices recurrent pain or swelling.    Patient to return for excision left axilla. The patient is aware to use a heating pad as needed for comfort.The patient is aware to call back for any questions or concerns.   HPI, Physical Exam, Assessment and Plan have been scribed under the direction and in the presence of Donnalee CurryJeffrey Byrnett, MD.  Ples SpecterJessica Qualls, CMA  Earline MayotteByrnett, Jeffrey W 10/12/2017, 11:04 AM

## 2017-10-12 DIAGNOSIS — L02412 Cutaneous abscess of left axilla: Secondary | ICD-10-CM

## 2017-10-12 HISTORY — DX: Cutaneous abscess of left axilla: L02.412

## 2017-10-21 ENCOUNTER — Ambulatory Visit: Payer: Managed Care, Other (non HMO) | Admitting: General Surgery

## 2017-10-21 ENCOUNTER — Encounter: Payer: Self-pay | Admitting: General Surgery

## 2017-10-21 VITALS — BP 128/78 | HR 80 | Resp 14 | Ht 68.0 in | Wt 279.0 lb

## 2017-10-21 DIAGNOSIS — L02412 Cutaneous abscess of left axilla: Secondary | ICD-10-CM

## 2017-10-21 NOTE — Patient Instructions (Addendum)
The patient is aware to call back for any questions or new concerns. May use ice pack today for comfort May shower starting tomorrow May remove dressing in 2-3 days apply bandage as needed

## 2017-10-21 NOTE — Addendum Note (Signed)
Addended by: Nicholes MangoBAILEY, Quindarius Cabello J on: 10/21/2017 05:22 PM   Modules accepted: Level of Service

## 2017-10-21 NOTE — Progress Notes (Signed)
Patient ID: Troy Olson, male   DOB: 1971/09/25, 47 y.o.   MRN: [id number]  Chief Complaint  Patient presents with  . Procedure    HPI Troy Olson is a 47 y.o. male.  Here for excision left axillary abscess.  The area has decreased in size since his last visit.  HPI  Past Medical History:  Diagnosis Date  . Asthma   . Hypertension   . Sleep apnea     Past Surgical History:  Procedure Laterality Date  . NO PAST SURGERIES      Family History  Problem Relation Age of Onset  . Hypertension Father        stents placed  . Cancer Sister     Social History Social History   Tobacco Use  . Smoking status: Former Smoker    Packs/day: 0.50    Years: 20.00    Pack years: 10.00    Types: Cigarettes    Last attempt to quit: 03/13/2011    Years since quitting: 6.6  . Smokeless tobacco: Never Used  Substance Use Topics  . Alcohol use: Yes    Comment: occasionally--dark liquer 4-5 drinks consumed weekly  . Drug use: No    Allergies  Allergen Reactions  . Food Allergy Formula Shortness Of Breath and Swelling    Any nuts of any kind.  . Peanut-Containing Drug Products Shortness Of Breath, Diarrhea, Nausea And Vomiting and Swelling    Current Outpatient Medications  Medication Sig Dispense Refill  . albuterol (PROVENTIL HFA;VENTOLIN HFA) 108 (90 BASE) MCG/ACT inhaler Inhale 2 puffs into the lungs every 6 (six) hours as needed. wheezing    . Cyanocobalamin (B-12 PO) Take 1 tablet by mouth daily.    . hydrochlorothiazide (HYDRODIURIL) 12.5 MG tablet Take 12.5 mg by mouth daily.    . hydrOXYzine (ATARAX/VISTARIL) 25 MG tablet Take 1 tablet (25 mg total) by mouth every 6 (six) hours. 12 tablet 0  . loratadine (CLARITIN) 10 MG tablet Take 10 mg by mouth daily as needed for allergies.     . montelukast (SINGULAIR) 10 MG tablet Take 10 mg by mouth daily.    . Multiple Vitamins-Minerals (MULTIVITAMIN WITH MINERALS) tablet Take 1 tablet by mouth daily.    . pravastatin (PRAVACHOL)  40 MG tablet Take 40 mg by mouth daily.     No current facility-administered medications for this visit.     Review of Systems Review of Systems  Constitutional: Negative.   Respiratory: Negative.   Cardiovascular: Negative.     Blood pressure 128/78, pulse 80, resp. rate 14, height 5\' 8"  (1.727 m), weight 279 lb (126.6 kg).  Physical Exam Physical Exam  Constitutional: He is oriented to person, place, and time. He appears well-developed and well-nourished.  Neurological: He is alert and oriented to person, place, and time.  Skin: Skin is warm and dry.  Psychiatric: His behavior is normal.  The left axilla shows a 1 cm area of residual inflammation in the deeper tissues and a 3-4 cm area of chronically thickened skin.  Data Reviewed Patient was amenable to excision.  ChloraPrep was applied to the skin.  20 cc of 0.5% Xylocaine with 0.25% Marcaine with 1-200,000 units of epinephrine was utilized and well-tolerated.  The area was recleansed with ChloraPrep and draped.  Through an elliptical incision approximately 1.5 x 3.5 cm in diameter the area was excised with complete removal of the inflammatory tissue.  The deep tissue was approximated with interrupted 3-0 Vicryl figure-of-eight sutures.  Hemostasis for 2 small vessels with similar sutures.  The skin was approximated with interrupted 4-0 Prolene simple sutures.  Gauze and Tegaderm applied.  The procedure was well tolerated.  Assessment    Chronic axillary abscess, excised.    Plan    Ice for comfort next 24 hours.  Tylenol/Advil/Aleve as needed for soreness.    Return in one week for suture removal.   HPI, Physical Exam, Assessment and Plan have been scribed under the direction and in the presence of Earline MayotteJeffrey W. Byrnett, MD. Dorathy DaftMarsha Ciarrah Rae, RN  I have completed the exam and reviewed the above documentation for accuracy and completeness.  I agree with the above.  [employer]useum[employer]gallery [employer] has been used and any errors in dictation or  transcription are unintentional.  Donnalee CurryJeffrey Byrnett, M.D., F.A.C.S.    5:16 PM

## 2017-10-26 LAB — PATHOLOGY

## 2017-10-27 ENCOUNTER — Telehealth: Payer: Self-pay | Admitting: *Deleted

## 2017-10-27 NOTE — Telephone Encounter (Signed)
-----   Message from Earline MayotteJeffrey W Byrnett, MD sent at 10/26/2017  4:07 PM EST ----- Please notify the patient that the pathology was an inflamed skin cyst as expected.  Thank you ----- Message ----- From: Nell RangeInterface, [facility] Lab Results In Sent: 10/26/2017   1:36 PM To: Earline MayotteJeffrey W Byrnett, MD

## 2017-10-27 NOTE — Telephone Encounter (Signed)
Left message for patient to call the office back

## 2017-10-28 ENCOUNTER — Ambulatory Visit (INDEPENDENT_AMBULATORY_CARE_PROVIDER_SITE_OTHER): Payer: Managed Care, Other (non HMO) | Admitting: *Deleted

## 2017-10-28 DIAGNOSIS — L02412 Cutaneous abscess of left axilla: Secondary | ICD-10-CM

## 2017-10-28 NOTE — Progress Notes (Signed)
Patient ID: Troy Olson, male   DOB: 03/26/1971, 47 y.o.   MRN: [id number]   Patient came in today for a wound check/suture removal.  The wound is clean, with no signs of infection noted. Follow up as needed.Aware of pathology.

## 2017-10-28 NOTE — Patient Instructions (Signed)
The patient is aware to call back for any questions or concerns.  

## 2018-08-03 ENCOUNTER — Ambulatory Visit: Payer: Managed Care, Other (non HMO) | Admitting: Internal Medicine

## 2018-08-03 ENCOUNTER — Encounter: Payer: Self-pay | Admitting: Internal Medicine

## 2018-08-03 VITALS — BP 140/88 | HR 77 | Temp 97.8°F | Ht 68.0 in | Wt 325.6 lb

## 2018-08-03 DIAGNOSIS — Z6841 Body Mass Index (BMI) 40.0 and over, adult: Secondary | ICD-10-CM

## 2018-08-03 DIAGNOSIS — R600 Localized edema: Secondary | ICD-10-CM

## 2018-08-03 NOTE — Progress Notes (Addendum)
Subjective:     Patient ID: Troy Olson, male   DOB: 03/12/1971, 47 y.o.   MRN: [id number] HPI - Pt is here to have me fill out a form in order for [place] to place him  And help him management of obesity and this is in order for him to get better heath insurance rates. He states he used to work on his diet and wt loss 09/2017 and has gained the weight back since then due to job changes. But now him and his wife will joint weight watches and start an exercise program begging with walking and eventually go back to doing cross fit ones he looses some wt in order to endure it.    Past Medical History:  Diagnosis Date  . Asthma   . Hypertension   . Sleep apnea     Food allergy formula and Peanut-containing drug products  Outpatient Medications Prior to Visit  Medication Sig Dispense Refill  . albuterol (PROVENTIL HFA;VENTOLIN HFA) 108 (90 BASE) MCG/ACT inhaler Inhale 2 puffs into the lungs every 6 (six) hours as needed. wheezing    . Cyanocobalamin (B-12 PO) Take 1 tablet by mouth daily.    . hydrochlorothiazide (HYDRODIURIL) 12.5 MG tablet Take 12.5 mg by mouth daily.    Marland Kitchen loratadine (CLARITIN) 10 MG tablet Take 10 mg by mouth daily as needed for allergies.     . montelukast (SINGULAIR) 10 MG tablet Take 10 mg by mouth daily.    . Multiple Vitamins-Minerals (MULTIVITAMIN WITH MINERALS) tablet Take 1 tablet by mouth daily.    . naproxen (NAPROSYN) 500 MG tablet Take 500 mg by mouth 2 (two) times daily with a meal.  1  . pravastatin (PRAVACHOL) 40 MG tablet Take 40 mg by mouth daily.    . hydrOXYzine (ATARAX/VISTARIL) 25 MG tablet Take 1 tablet (25 mg total) by mouth every 6 (six) hours. 12 tablet 0   No facility-administered medications prior to visit.      HPI    Review of Systems  Respiratory: Negative for shortness of breath.   Cardiovascular: Positive for leg swelling. Negative for chest pain and palpitations.       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   Musculoskeletal:  Negative for arthralgias and back pain.  Psychiatric/Behavioral: Negative for sleep disturbance.       Uses his C-pap every day and feels rested when he wakes up.        Objective:   Physical Exam  Constitutional: He is oriented to person, place, and time. He appears well-developed and well-nourished. No distress.  Who is obese  HENT:  Head: Normocephalic.  Right Ear: External ear normal.  Left Ear: External ear normal.  Nose: Nose normal.  Eyes: Conjunctivae are normal. No scleral icterus.  Neck: Neck supple. No thyromegaly present.  Cardiovascular: Normal rate and regular rhythm.  No murmur heard. Pulmonary/Chest: Effort normal and breath sounds normal. No respiratory distress.  Musculoskeletal: Normal range of motion. He exhibits edema.  Mild +1/4 pitting edema of L ankle.   Lymphadenopathy:    He has no cervical adenopathy.  Neurological: He is alert and oriented to person, place, and time.  Skin: Skin is warm and dry. Capillary refill takes less than 2 seconds. He is not diaphoretic.  Psychiatric: He has a normal mood and affect. His behavior is normal. Judgment and thought content normal.  Nursing note and vitals reviewed.  Today's Vitals   08/03/18 0855  BP: 140/88  Pulse: 77  Temp: 97.8 F (36.6 C)  TempSrc: Oral  SpO2: 98%  Weight: (!) 325 lb 9.6 oz (147.7 kg)  Height: 5\' 8"  (1.727 m)   Body mass index is 49.51 kg/m.      Assessment:      1. Class 3 severe obesity with body mass index (BMI) of 45.0 to 49.9 in adult, unspecified obesity type, unspecified whether serious comorbidity present (HCC)- I filled out his form and placed a plan to follow decreased cal, and low carb diet and exercise.  2- Mild edema- as he watches his diet, his sodium will decrease and edema should improve. No treatment for now.        Plan:     I filled out the form she needed, se scanned form for plan.

## 2018-10-23 ENCOUNTER — Other Ambulatory Visit: Payer: Self-pay | Admitting: Nurse Practitioner

## 2019-01-04 ENCOUNTER — Other Ambulatory Visit: Payer: Self-pay

## 2019-01-04 ENCOUNTER — Telehealth: Payer: Self-pay

## 2019-01-04 MED ORDER — MONTELUKAST SODIUM 10 MG PO TABS: 10.0000 mg | ORAL_TABLET | Freq: Every day | ORAL | 0 refills | Status: DC

## 2019-01-04 NOTE — Telephone Encounter (Signed)
Patient's singular has been faxed to [employer].

## 2019-01-16 ENCOUNTER — Other Ambulatory Visit: Payer: Self-pay

## 2019-01-16 MED ORDER — ALBUTEROL SULFATE HFA 108 (90 BASE) MCG/ACT IN AERS: 2.0000 | INHALATION_SPRAY | Freq: Four times a day (QID) | RESPIRATORY_TRACT | 0 refills | Status: DC | PRN

## 2019-01-24 ENCOUNTER — Encounter: Payer: Self-pay | Admitting: Nurse Practitioner

## 2019-01-24 ENCOUNTER — Ambulatory Visit: Payer: Managed Care, Other (non HMO) | Admitting: Nurse Practitioner

## 2019-01-24 ENCOUNTER — Other Ambulatory Visit: Payer: Self-pay

## 2019-01-24 DIAGNOSIS — Z6841 Body Mass Index (BMI) 40.0 and over, adult: Secondary | ICD-10-CM

## 2019-01-24 DIAGNOSIS — J45909 Unspecified asthma, uncomplicated: Secondary | ICD-10-CM | POA: Diagnosis not present

## 2019-01-24 DIAGNOSIS — I10 Essential (primary) hypertension: Secondary | ICD-10-CM | POA: Diagnosis not present

## 2019-01-24 DIAGNOSIS — Z7189 Other specified counseling: Secondary | ICD-10-CM

## 2019-01-24 DIAGNOSIS — E782 Mixed hyperlipidemia: Secondary | ICD-10-CM | POA: Diagnosis not present

## 2019-01-24 MED ORDER — ALBUTEROL SULFATE HFA 108 (90 BASE) MCG/ACT IN AERS: 2.0000 | INHALATION_SPRAY | Freq: Four times a day (QID) | RESPIRATORY_TRACT | 1 refills | Status: DC | PRN

## 2019-01-24 NOTE — Progress Notes (Signed)
This visit type was conducted due to national recommendations for restrictions regarding the COVID-19 Pandemic (e.g. social distancing). This format is felt to be most appropriate for this patient at this time.  All issues noted in this document were discussed and addressed.  No physical exam was performed (except for noted visual exam findings with Video Visits).  Please refer to the patient's chart (MyChart message for video visits and phone note for telephone visits) for the patient's consent to telehealth for [facility].   Subjective:     Patient ID: Troy Olson , male    DOB: February 03, 1971 , 48 y.o.   MRN: [id number]  Virtual Visit via Note  I connected with Troy Olson on 02/05/19 at  2:00 PM EDT by telephone and verified that I am speaking with the correct person using two identifiers.  Provider location: At home Patient location: In his truck  I discussed the limitations, risks, security and privacy concerns of performing an evaluation and management service by telephone and the availability of in person appointments. I also discussed with the patient that there may be a patient responsible charge related to this service. The patient expressed understanding and agreed to proceed.  Chief Complaint  Patient presents with  . Hypertension    History of Present Illness:   [place] [facility] is following him for his weight loss. He is just beginning back to exercising and trying to eat healthy  Asthma  There is no cough, shortness of breath (noticed when walking and exercising and is short winded otherwise has been good ) or wheezing. This is a chronic problem. The current episode started more than 1 year ago. The problem has been unchanged. Pertinent negatives include no fever, headaches, malaise/fatigue or sore throat. His past medical history is significant for asthma.     Past Medical History:  Diagnosis Date  . Asthma   . Hypertension   . Sleep apnea      Family  History  Problem Relation Age of Onset  . Hypertension Father        stents placed  . Cancer Sister      Current Outpatient Medications:  .  albuterol (PROVENTIL HFA;VENTOLIN HFA) 108 (90 Base) MCG/ACT inhaler, Inhale 2 puffs into the lungs every 6 (six) hours as needed. wheezing, Disp: 3 Inhaler, Rfl: 1 .  Cyanocobalamin (B-12 PO), Take 1 tablet by mouth daily., Disp: , Rfl:  .  hydrochlorothiazide (HYDRODIURIL) 12.5 MG tablet, Take 12.5 mg by mouth daily., Disp: , Rfl:  .  loratadine (CLARITIN) 10 MG tablet, Take 10 mg by mouth daily as needed for allergies. , Disp: , Rfl:  .  montelukast (SINGULAIR) 10 MG tablet, Take 1 tablet (10 mg total) by mouth daily., Disp: 90 tablet, Rfl: 0 .  Multiple Vitamins-Minerals (MULTIVITAMIN WITH MINERALS) tablet, Take 1 tablet by mouth daily., Disp: , Rfl:  .  naproxen (NAPROSYN) 500 MG tablet, Take 500 mg by mouth 2 (two) times daily with a meal., Disp: , Rfl: 1 .  pravastatin (PRAVACHOL) 40 MG tablet, TAKE 1 TABLET BY MOUTH  EVERY EVENING, Disp: 90 tablet, Rfl: 1   Allergies  Allergen Reactions  . Food Allergy Formula Shortness Of Breath and Swelling    Any nuts of any kind.  . Peanut-Containing Drug Products Shortness Of Breath, Diarrhea, Nausea And Vomiting and Swelling     Review of Systems  Constitutional: Negative for fever and malaise/fatigue.  HENT: Negative.  Negative for sore throat.  Respiratory: Negative for cough, shortness of breath (noticed when walking and exercising and is short winded otherwise has been good ) and wheezing.   Skin: Negative.   Neurological: Negative for dizziness and headaches.     There were no vitals filed for this visit.  Observations/Objective: No acute distress, no shortness of breath.        Assessment and Plan: 1. Asthma in adult without complication, unspecified asthma severity, unspecified whether persistent  No current exacerbation has just noticed now that he is starting back to exercise he  is a little winded but not sure if related to his recent weight gain  Encouraged to use his albuterol inhaler pre workout  Also advised to wear a mask at all times as recommended by the CDC due to the COVID-19 Pandemic - albuterol (PROVENTIL HFA;VENTOLIN HFA) 108 (90 Base) MCG/ACT inhaler; Inhale 2 puffs into the lungs every 6 (six) hours as needed. wheezing  Dispense: 3 Inhaler; Refill: 1  2. Class 3 severe obesity with body mass index (BMI) of 45.0 to 49.9 in adult, unspecified obesity type, unspecified whether serious comorbidity present (HCC)  No current weight, he has not been back to the office due to having a cyst removed and his father passed away towards the end of the year  [facility]   Encouraged him to make sure eating a healthy diet particularly during this time with COVID-19 and Stay at Home orders so he is not as active - albuterol (PROVENTIL HFA;VENTOLIN HFA) 108 (90 Base) MCG/ACT inhaler; Inhale 2 puffs into the lungs every 6 (six) hours as needed. wheezing  Dispense: 3 Inhaler; Refill: 1  3. Mixed hyperlipidemia  Chronic, controlled  Continue with current medications - CMP14 + Anion Gap; Future - Lipid panel; Future  4. Essential hypertension . He was not able to check his blood pressure prior to this virtual visit . CMP ordered to check renal function will come to office this week for labs  . The importance of regular exercise and dietary modification was stressed to the patient.  . Stressed importance of losing ten percent of her body weight to help with B/P control.  . The weight loss would help with decreasing cardiac and cancer risk as well.  - CMP14 + Anion Gap; Future   Follow Up Instructions:  Come to office on Thurs April 16 for labs at 830am  I discussed the assessment and treatment plan with the patient. The patient was provided an opportunity to ask questions and all were answered. The patient agreed with the plan and  demonstrated an understanding of the instructions.   The patient was advised to call back or seek an in-person evaluation if the symptoms worsen or if the condition fails to improve as anticipated.  COVID-19 Education: The signs and symptoms of COVID-19 were discussed with the patient and how to seek care for testing (follow up with PCP or arrange E-visit).  The importance of social distancing was discussed today.   Patient Risk:   After full review of this patients clinical status, I feel that they are at least moderate risk at this time.   I provided 13 minutes of non-face-to-face time during this encounter.   Arnette FeltsJanece Harvest Stanco, FNP

## 2019-01-25 LAB — LIPID PANEL
Chol/HDL Ratio: 3.9 ratio (ref 0.0–5.0)
Cholesterol, Total: 205 mg/dL — ABNORMAL HIGH (ref 100–199)
HDL: 53 mg/dL (ref >39)
LDL Calculated: 139 mg/dL — ABNORMAL HIGH (ref 0–99)
Triglycerides: 64 mg/dL (ref 0–149)
VLDL Cholesterol Cal: 13 mg/dL (ref 5–40)

## 2019-01-25 LAB — CMP14 + ANION GAP
ALT: 24 IU/L (ref 0–44)
AST: 22 IU/L (ref 0–40)
Albumin/Globulin Ratio: 1.6 (ref 1.2–2.2)
Albumin: 4.6 g/dL (ref 4.0–5.0)
Alkaline Phosphatase: 63 IU/L (ref 39–117)
Anion Gap: 17 mmol/L (ref 10.0–18.0)
BUN/Creatinine Ratio: 14 (ref 9–20)
BUN: 15 mg/dL (ref 6–24)
Bilirubin Total: 0.5 mg/dL (ref 0.0–1.2)
CO2: 25 mmol/L (ref 20–29)
Calcium: 9.5 mg/dL (ref 8.7–10.2)
Chloride: 99 mmol/L (ref 96–106)
Creatinine, Ser: 1.05 mg/dL (ref 0.76–1.27)
GFR calc Af Amer: 99 mL/min/{1.73_m2} (ref >59)
GFR calc non Af Amer: 85 mL/min/{1.73_m2} (ref >59)
Globulin, Total: 2.9 g/dL (ref 1.5–4.5)
Glucose: 85 mg/dL (ref 65–99)
Potassium: 4.4 mmol/L (ref 3.5–5.2)
Sodium: 141 mmol/L (ref 134–144)
Total Protein: 7.5 g/dL (ref 6.0–8.5)

## 2019-01-26 ENCOUNTER — Other Ambulatory Visit: Payer: Managed Care, Other (non HMO)

## 2019-01-26 ENCOUNTER — Other Ambulatory Visit: Payer: Self-pay

## 2019-02-25 ENCOUNTER — Encounter: Payer: Self-pay | Admitting: Emergency Medicine

## 2019-02-25 ENCOUNTER — Emergency Department
Admission: EM | Admit: 2019-02-25 | Discharge: 2019-02-25 | Disposition: A | Discharge status: Home / Self Care | Payer: Managed Care, Other (non HMO) | Attending: Emergency Medicine | Admitting: Emergency Medicine

## 2019-02-25 ENCOUNTER — Other Ambulatory Visit: Payer: Self-pay

## 2019-02-25 DIAGNOSIS — J029 Acute pharyngitis, unspecified: Secondary | ICD-10-CM | POA: Insufficient documentation

## 2019-02-25 DIAGNOSIS — T781XXA Other adverse food reactions, not elsewhere classified, initial encounter: Secondary | ICD-10-CM

## 2019-02-25 MED ORDER — DIPHENHYDRAMINE HCL 50 MG/ML IJ SOLN
25.0000 mg | Freq: Once | INTRAMUSCULAR | Status: AC
  Administered 2019-02-25: 25 mg via INTRAVENOUS
  Filled 2019-02-25: qty 1

## 2019-02-25 MED ORDER — METHYLPREDNISOLONE SODIUM SUCC 125 MG IJ SOLR
125.0000 mg | Freq: Once | INTRAMUSCULAR | Status: AC
  Administered 2019-02-25: 125 mg via INTRAVENOUS
  Filled 2019-02-25: qty 2

## 2019-02-25 NOTE — ED Notes (Signed)
Patient AAOX4. Vitals Stable. NAD. 

## 2019-02-25 NOTE — ED Provider Notes (Signed)
[facility] Emergency Department Provider Note ____________________________________________   First MD Initiated Contact with Patient 02/25/19 [phone]     (approximate)  I have reviewed the triage vital signs and the nursing notes.   HISTORY  Chief Complaint Allergic Reaction    HPI Troy Olson is a 48 y.o. male with PMH as noted below who presents with symptoms concerning for an allergic reaction, acute onset within the last hour and occurring after he excellently ate something containing nuts.  He reports a discomfort in his throat but no difficulty breathing or swallowing.  He has no rash or other acute symptoms.  He previously had an anaphylactic reaction with shortness of breath, vomiting, and syncope so he was concerned.  He states he has an EpiPen but decided not to use it.  He did not take any other medications.  Past Medical History:  Diagnosis Date  . Asthma   . Hypertension   . Sleep apnea     Patient Active Problem List   Diagnosis Date Noted  . Asthma in adult without complication 01/24/2019  . Class 3 severe obesity with body mass index (BMI) of 45.0 to 49.9 in adult (HCC) 01/24/2019  . Abscess of axilla, left 10/12/2017  . Moderate persistent asthma 03/16/2012  . Morbid obesity with BMI of 40.0-44.9, adult (HCC) 03/16/2012    Past Surgical History:  Procedure Laterality Date  . NO PAST SURGERIES      Prior to Admission medications   Medication Sig Start Date End Date Taking? Authorizing Provider  albuterol (PROVENTIL HFA;VENTOLIN HFA) 108 (90 Base) MCG/ACT inhaler Inhale 2 puffs into the lungs every 6 (six) hours as needed. wheezing 01/24/19   [name], FNP  Cyanocobalamin (B-12 PO) Take 1 tablet by mouth daily.    [provider]  hydrochlorothiazide (HYDRODIURIL) 12.5 MG tablet Take 12.5 mg by mouth daily.    [provider]  loratadine (CLARITIN) 10 MG tablet Take 10 mg by mouth daily as needed for  allergies.     [provider]  montelukast (SINGULAIR) 10 MG tablet Take 1 tablet (10 mg total) by mouth daily. 01/04/19   Rodriguez-Southworth, Nettie Elm, PA-C  Multiple Vitamins-Minerals (MULTIVITAMIN WITH MINERALS) tablet Take 1 tablet by mouth daily.    [provider]  naproxen (NAPROSYN) 500 MG tablet Take 500 mg by mouth 2 (two) times daily with a meal. 04/30/18   [provider]  pravastatin (PRAVACHOL) 40 MG tablet TAKE 1 TABLET BY MOUTH  EVERY EVENING 10/24/18   Rodriguez-Southworth, Nettie Elm, PA-C    Allergies Food allergy formula and Peanut-containing drug products  Family History  Problem Relation Age of Onset  . Hypertension Father        stents placed  . Cancer Sister     Social History Social History   Tobacco Use  . Smoking status: Former Smoker    Packs/day: 0.50    Years: 20.00    Pack years: 10.00    Types: Cigarettes    Last attempt to quit: 03/13/2011    Years since quitting: 7.9  . Smokeless tobacco: Never Used  Substance Use Topics  . Alcohol use: Yes    Comment: occasionally--dark liquer 4-5 drinks consumed weekly  . Drug use: No    Review of Systems  Constitutional: No fever. Eyes: No redness. ENT: Positive for throat discomfort. Cardiovascular: Denies chest pain. Respiratory: Denies shortness of breath. Gastrointestinal: No vomiting or diarrhea.  Genitourinary: Negative for flank pain.  Musculoskeletal: Negative for  back pain. Skin: Negative for rash. Neurological: Negative for headache.   ____________________________________________   PHYSICAL EXAM:  VITAL SIGNS: ED Triage Vitals  Enc Vitals Group     BP 02/25/19 0818 (!) 112/92     Pulse Rate 02/25/19 0818 97     Resp 02/25/19 0818 20     Temp 02/25/19 0818 97.6 F (36.4 C)     Temp Source 02/25/19 0818 Oral     SpO2 02/25/19 0818 97 %     Weight 02/25/19 0820 (!) 310 lb (140.6 kg)     Height 02/25/19 0820 5\' 8"  (1.727 m)     Head Circumference --       Peak Flow --      Pain Score 02/25/19 0820 0     Pain Loc --      Pain Edu? --      Excl. in GC? --     Constitutional: Alert and oriented. Well appearing and in no acute distress. Eyes: Conjunctivae are normal.  Head: Atraumatic. Nose: No congestion/rhinnorhea. Mouth/Throat: Mucous membranes are moist.  Oropharynx clear with no visible swelling or pooled secretions.  No stridor. Neck: Normal range of motion.  Cardiovascular: Normal rate, regular rhythm. Grossly normal heart sounds.  Good peripheral circulation. Respiratory: Normal respiratory effort.  No retractions. Lungs CTAB. Gastrointestinal: No distention.  Musculoskeletal: Extremities warm and well perfused.  Neurologic:  Normal speech and language. No gross focal neurologic deficits are appreciated.  Skin:  Skin is warm and dry. No rash noted. Psychiatric: Mood and affect are normal. Speech and behavior are normal.  ____________________________________________   LABS (all labs ordered are listed, but only abnormal results are displayed)  Labs Reviewed - No data to display ____________________________________________  EKG   ____________________________________________  RADIOLOGY    ____________________________________________   PROCEDURES  Procedure(s) performed: No  Procedures  Critical Care performed: No ____________________________________________   INITIAL IMPRESSION / ASSESSMENT AND PLAN / ED COURSE  Pertinent labs & imaging results that were available during my care of the patient were reviewed by me and considered in my medical decision making (see chart for details).  [age] year old male with PMH as noted above presents with throat discomfort after accidentally eating something containing nuts, consistent with prior allergic reactions.  He denies shortness of breath or any GI symptoms.  He has no urticaria.  On exam, the patient is well-appearing.  His vital signs are normal.  Oropharynx shows no  swelling or other concerning findings.  Lungs are clear.  Overall presentation is consistent with a mild allergic reaction.  There is no indication for epinephrine at this time.  We will give Solu-Medrol, Benadryl, and observe for 1 to 2 hours.  ----------------------------------------- 10:12 AM on 02/25/2019 -----------------------------------------  The patient symptoms have resolved.  He feels well and would like to go home.  He is stable for discharge at this time.  Return precautions given, and he expresses understanding.  ____________________________________________   FINAL CLINICAL IMPRESSION(S) / ED DIAGNOSES  Final diagnoses:  Allergic reaction to food, initial encounter      NEW MEDICATIONS STARTED DURING THIS VISIT:  New Prescriptions   No medications on file     Note:  This document was prepared using Dragon voice recognition software and may include unintentional dictation errors.    Dionne BucySiadecki, Audri Kozub, MD 02/25/19 1012

## 2019-02-25 NOTE — ED Triage Notes (Signed)
States ate cookie with nuts 20 min ago. Scratchy throat. Speaking full sentences. History of severe reaction.

## 2019-03-01 ENCOUNTER — Other Ambulatory Visit: Payer: Self-pay | Admitting: Internal Medicine

## 2019-03-26 ENCOUNTER — Other Ambulatory Visit: Payer: Self-pay | Admitting: Internal Medicine

## 2019-05-04 ENCOUNTER — Encounter: Payer: Self-pay | Admitting: Internal Medicine

## 2019-06-07 ENCOUNTER — Other Ambulatory Visit: Payer: Self-pay

## 2019-06-07 DIAGNOSIS — Z6841 Body Mass Index (BMI) 40.0 and over, adult: Secondary | ICD-10-CM

## 2019-06-07 DIAGNOSIS — J45909 Unspecified asthma, uncomplicated: Secondary | ICD-10-CM

## 2019-06-07 MED ORDER — ALBUTEROL SULFATE HFA 108 (90 BASE) MCG/ACT IN AERS: 2.0000 | INHALATION_SPRAY | Freq: Four times a day (QID) | RESPIRATORY_TRACT | 0 refills | Status: DC | PRN

## 2019-06-15 ENCOUNTER — Encounter: Payer: Managed Care, Other (non HMO) | Admitting: Internal Medicine

## 2019-06-20 ENCOUNTER — Other Ambulatory Visit: Payer: Self-pay | Admitting: Internal Medicine

## 2019-07-27 ENCOUNTER — Ambulatory Visit: Payer: Managed Care, Other (non HMO) | Admitting: Nurse Practitioner

## 2019-07-27 ENCOUNTER — Other Ambulatory Visit: Payer: Self-pay

## 2019-07-27 ENCOUNTER — Encounter: Payer: Self-pay | Admitting: Nurse Practitioner

## 2019-07-27 VITALS — BP 138/98 | Temp 98.0°F | Ht 67.6 in | Wt 318.2 lb

## 2019-07-27 DIAGNOSIS — L819 Disorder of pigmentation, unspecified: Secondary | ICD-10-CM

## 2019-07-27 DIAGNOSIS — Z125 Encounter for screening for malignant neoplasm of prostate: Secondary | ICD-10-CM

## 2019-07-27 DIAGNOSIS — R21 Rash and other nonspecific skin eruption: Secondary | ICD-10-CM

## 2019-07-27 DIAGNOSIS — I10 Essential (primary) hypertension: Secondary | ICD-10-CM

## 2019-07-27 DIAGNOSIS — E559 Vitamin D deficiency, unspecified: Secondary | ICD-10-CM

## 2019-07-27 DIAGNOSIS — Z23 Encounter for immunization: Secondary | ICD-10-CM

## 2019-07-27 DIAGNOSIS — J45909 Unspecified asthma, uncomplicated: Secondary | ICD-10-CM

## 2019-07-27 DIAGNOSIS — Z0000 Encounter for general adult medical examination without abnormal findings: Secondary | ICD-10-CM

## 2019-07-27 DIAGNOSIS — Z1211 Encounter for screening for malignant neoplasm of colon: Secondary | ICD-10-CM

## 2019-07-27 DIAGNOSIS — E66813 Obesity, class 3: Secondary | ICD-10-CM

## 2019-07-27 DIAGNOSIS — E782 Mixed hyperlipidemia: Secondary | ICD-10-CM

## 2019-07-27 DIAGNOSIS — Z6841 Body Mass Index (BMI) 40.0 and over, adult: Secondary | ICD-10-CM

## 2019-07-27 LAB — POCT UA - MICROALBUMIN
Albumin/Creatinine Ratio, Urine, POC: 30
Creatinine, POC: 10 mg/dL
Microalbumin Ur, POC: 10 mg/L

## 2019-07-27 LAB — POCT URINALYSIS DIPSTICK
Bilirubin, UA: NEGATIVE
Glucose, UA: NEGATIVE
Ketones, UA: NEGATIVE
Leukocytes, UA: NEGATIVE
Nitrite, UA: NEGATIVE
Protein, UA: NEGATIVE
Spec Grav, UA: 1.01 (ref 1.010–1.025)
Urobilinogen, UA: 0.2 E.U./dL
pH, UA: 6.5 (ref 5.0–8.0)

## 2019-07-27 MED ORDER — NYSTATIN 100000 UNIT/GM EX POWD: Freq: Four times a day (QID) | CUTANEOUS | 0 refills | Status: DC

## 2019-07-27 NOTE — Progress Notes (Signed)
Subjective:     Patient ID: Troy Olson , male    DOB: 04-Feb-1971 , 48 y.o.   MRN: [id number]   Chief Complaint  Patient presents with  . Annual Exam    HPI  Here for HM  Wt Readings from Last 3 Encounters: 07/27/19 : (!) 318 lb 3.2 oz (144.3 kg) 02/25/19 : (!) 310 lb (140.6 kg) 08/03/18 : (!) 325 lb 9.6 oz (147.7 kg)  He feels in 2018 wt was 260's.    He does admit to being up late last night.     Men's preventive visit. Patient Health Questionnaire (PHQ-2) is    Office Visit from 07/27/2019 in [facility]  PHQ-2 Total Score  0     Patient is on a regular diet, he is planning to start back on weight watchers. Marital status: Married. Relevant history for alcohol use is:  Social History   Substance and Sexual Activity  Alcohol Use Yes   Comment: occasionally--dark liquer 4-5 drinks consumed weekly   Relevant history for tobacco use is:  Social History   Tobacco Use  Smoking Status Former Smoker  . Packs/day: 0.50  . Years: 20.00  . Pack years: 10.00  . Types: Cigarettes  . Quit date: 03/13/2011  . Years since quitting: 8.3  Smokeless Tobacco Never Used    Past Medical History:  Diagnosis Date  . Asthma   . Hypertension   . Sleep apnea      Family History  Problem Relation Age of Onset  . Hypertension Father        stents placed  . Cancer Sister      Current Outpatient Medications:  .  albuterol (VENTOLIN HFA) 108 (90 Base) MCG/ACT inhaler, Inhale 2 puffs into the lungs every 6 (six) hours as needed. wheezing, Disp: 18 g, Rfl: 0 .  Cyanocobalamin (B-12 PO), Take 1 tablet by mouth daily., Disp: , Rfl:  .  hydrochlorothiazide (HYDRODIURIL) 12.5 MG tablet, TAKE 1 TABLET BY MOUTH  EVERY DAY, Disp: 90 tablet, Rfl: 1 .  loratadine (CLARITIN) 10 MG tablet, Take 10 mg by mouth daily as needed for allergies. , Disp: , Rfl:  .  montelukast (SINGULAIR) 10 MG tablet, TAKE 1 TABLET BY MOUTH  DAILY, Disp: 90 tablet, Rfl: 0 .  Multiple  Vitamins-Minerals (MULTIVITAMIN WITH MINERALS) tablet, Take 1 tablet by mouth daily., Disp: , Rfl:  .  naproxen (NAPROSYN) 500 MG tablet, Take 500 mg by mouth 2 (two) times daily with a meal., Disp: , Rfl: 1 .  pravastatin (PRAVACHOL) 40 MG tablet, TAKE 1 TABLET BY MOUTH  EVERY EVENING, Disp: 90 tablet, Rfl: 1   Allergies  Allergen Reactions  . Food Allergy Formula Shortness Of Breath and Swelling    Any nuts of any kind.  . Peanut-Containing Drug Products Shortness Of Breath, Diarrhea, Nausea And Vomiting and Swelling     Review of Systems  Constitutional: Negative.   HENT: Negative.   Eyes: Negative.   Respiratory: Negative.   Cardiovascular: Negative.   Gastrointestinal: Negative.   Endocrine: Negative.   Genitourinary: Negative.   Musculoskeletal: Negative.   Allergic/Immunologic: Negative.   Neurological: Negative.   Hematological: Negative.   Psychiatric/Behavioral: Negative.      Today's Vitals   07/27/19 0856  BP: (!) 138/98  Temp: 98 F (36.7 C)  TempSrc: Oral  Weight: (!) 318 lb 3.2 oz (144.3 kg)  Height: 5' 7.6" (1.717 m)  PainSc: 0-No pain   Body mass index is  48.96 kg/m.   Objective:  Physical Exam Vitals signs reviewed.  Constitutional:      General: He is not in acute distress.    Appearance: Normal appearance. He is obese.  HENT:     Head: Normocephalic and atraumatic.     Right Ear: Tympanic membrane, ear canal and external ear normal. There is no impacted cerumen.     Left Ear: Tympanic membrane, ear canal and external ear normal. There is no impacted cerumen.  Neck:     Musculoskeletal: Normal range of motion and neck supple.  Cardiovascular:     Rate and Rhythm: Normal rate and regular rhythm.     Pulses: Normal pulses.     Heart sounds: Normal heart sounds. No murmur.  Pulmonary:     Effort: Pulmonary effort is normal. No respiratory distress.     Breath sounds: Normal breath sounds.  Abdominal:     General: Abdomen is flat. Bowel  sounds are normal. There is no distension.     Palpations: Abdomen is soft.  Genitourinary:    Prostate: Normal.     Rectum: Guaiac result negative.  Musculoskeletal: Normal range of motion.  Skin:    General: Skin is warm.     Capillary Refill: Capillary refill takes less than 2 seconds.  Neurological:     General: No focal deficit present.     Mental Status: He is alert and oriented to person, place, and time.  Psychiatric:        Mood and Affect: Mood normal.        Behavior: Behavior normal.        Thought Content: Thought content normal.        Judgment: Judgment normal.         Assessment And Plan:     1. Health maintenance examination . Behavior modifications discussed and diet history reviewed.   . Pt will continue to exercise regularly and modify diet with low GI, plant based foods and decrease intake of processed foods.  . Recommend intake of daily multivitamin, Vitamin D, and calcium.  . Recommend mammogram and colonoscopy for preventive screenings, as well as recommend immunizations that include influenza, TDAP - Lipid Profile - CMP14 + Anion Gap - Hemoglobin A1c - Vitamin D (25 hydroxy)  2. Need for influenza vaccination  Influenza vaccine given in office  Advised to take Tylenol as needed for muscle aches or fever - Flu Vaccine QUAD 6+ mos PF IM (Fluarix Quad PF)  3. Encounter for prostate cancer screening   4. Class 3 severe obesity with body mass index (BMI) of 45.0 to 49.9 in adult, unspecified obesity type, unspecified whether serious comorbidity present (HCC)  Chronic  Discussed healthy diet and regular exercise options   Encouraged to exercise at least 150 minutes per week with 2 days of strength training - Lipid Profile - Hemoglobin A1c  5. Essential hypertension . B/P is fairly controlled.  . CMP ordered to check renal function.  . The importance of regular exercise and dietary modification was stressed to the patient.  . Stressed  importance of losing ten percent of her body weight to help with B/P control.  . The weight loss would help with decreasing cardiac and cancer risk as well.  . EKG done no change from previous, HR 66 - POCT Urinalysis Dipstick (81002) - POCT UA - Microalbumin - EKG 12-Lead - CBC no Diff  6. Mixed hyperlipidemia  Chronic, controlled  Continue with current medications  7. Asthma in  adult without complication, unspecified asthma severity, unspecified whether persistent Chronic, stable No acute exacerbations  8. Vitamin D deficiency  Will check vitamin D level and supplement as needed.     Also encouraged to spend 15 minutes in the sun daily.  - Vitamin D (25 hydroxy)  9. Rash and nonspecific skin eruption  Has a moist rash to lower abdomen where the top of his pants lay  Nystatin powder sent to pharmacy  10. Hypopigmentation He has hypopigmentation to mid lower abdomen where his stomach overlays and may be touching his belt buckle, this could be vitiligo or an allergy to nickel. Will consider referral to dermatology for further evaluation.            Arnette FeltsJanece Yanin Muhlestein, FNP    THE PATIENT IS ENCOURAGED TO PRACTICE SOCIAL DISTANCING DUE TO THE COVID-19 PANDEMIC.

## 2019-07-27 NOTE — Patient Instructions (Signed)
Health Maintenance  Topic Date Due  . HIV Screening  07/26/2020 (Originally [birth year]/07/1988)  . TETANUS/TDAP  04/07/2027  . INFLUENZA VACCINE  Completed   Health Maintenance, Male Adopting a healthy lifestyle and getting preventive care are important in promoting health and wellness. Ask your health care provider about:  The right schedule for you to have regular tests and exams.  Things you can do on your own to prevent diseases and keep yourself healthy. What should I know about diet, weight, and exercise? Eat a healthy diet   Eat a diet that includes plenty of vegetables, fruits, low-fat dairy products, and lean protein.  Do not eat a lot of foods that are high in solid fats, added sugars, or sodium. Maintain a healthy weight Body mass index (BMI) is a measurement that can be used to identify possible weight problems. It estimates body fat based on height and weight. Your health care provider can help determine your BMI and help you achieve or maintain a healthy weight. Get regular exercise Get regular exercise. This is one of the most important things you can do for your health. Most adults should:  Exercise for at least 150 minutes each week. The exercise should increase your heart rate and make you sweat (moderate-intensity exercise).  Do strengthening exercises at least twice a week. This is in addition to the moderate-intensity exercise.  Spend less time sitting. Even light physical activity can be beneficial. Watch cholesterol and blood lipids Have your blood tested for lipids and cholesterol at 46 years of age, then have this test every 5 years. You may need to have your cholesterol levels checked more often if:  Your lipid or cholesterol levels are high.  You are older than 46 years of age.  You are at high risk for heart disease. What should I know about cancer screening? Many types of cancers can be detected early and may often be prevented. Depending on your health  history and family history, you may need to have cancer screening at various ages. This may include screening for:  Colorectal cancer.  Prostate cancer.  Skin cancer.  Lung cancer. What should I know about heart disease, diabetes, and high blood pressure? Blood pressure and heart disease  High blood pressure causes heart disease and increases the risk of stroke. This is more likely to develop in people who have high blood pressure readings, are of African descent, or are overweight.  Talk with your health care provider about your target blood pressure readings.  Have your blood pressure checked: ? Every 3-5 years if you are 12-16 years of age. ? Every year if you are 47 years old or older.  If you are between the ages of 89 and 58 and are a current or former smoker, ask your health care provider if you should have a one-time screening for abdominal aortic aneurysm (AAA). Diabetes Have regular diabetes screenings. This checks your fasting blood sugar level. Have the screening done:  Once every three years after age 50 if you are at a normal weight and have a low risk for diabetes.  More often and at a younger age if you are overweight or have a high risk for diabetes. What should I know about preventing infection? Hepatitis B If you have a higher risk for hepatitis B, you should be screened for this virus. Talk with your health care provider to find out if you are at risk for hepatitis B infection. Hepatitis C Blood testing is recommended for:  Everyone born from [birth year] through 1963.  Anyone with known risk factors for hepatitis C. Sexually transmitted infections (STIs)  You should be screened each year for STIs, including gonorrhea and chlamydia, if: ? You are sexually active and are younger than 46 years of age. ? You are older than 46 years of age and your health care provider tells you that you are at risk for this type of infection. ? Your sexual activity has changed since  you were last screened, and you are at increased risk for chlamydia or gonorrhea. Ask your health care provider if you are at risk.  Ask your health care provider about whether you are at high risk for HIV. Your health care provider may recommend a prescription medicine to help prevent HIV infection. If you choose to take medicine to prevent HIV, you should first get tested for HIV. You should then be tested every 3 months for as long as you are taking the medicine. Follow these instructions at home: Lifestyle  Do not use any products that contain nicotine or tobacco, such as cigarettes, e-cigarettes, and chewing tobacco. If you need help quitting, ask your health care provider.  Do not use street drugs.  Do not share needles.  Ask your health care provider for help if you need support or information about quitting drugs. Alcohol use  Do not drink alcohol if your health care provider tells you not to drink.  If you drink alcohol: ? Limit how much you have to 0-2 drinks a day. ? Be aware of how much alcohol is in your drink. In the [place] one drink equals one 12 oz bottle of beer (355 mL), one 5 oz glass of wine (148 mL), or one 1 oz glass of hard liquor (44 mL). General instructions  Schedule regular health, dental, and eye exams.  Stay current with your vaccines.  Tell your health care provider if: ? You often feel depressed. ? You have ever been abused or do not feel safe at home. Summary  Adopting a healthy lifestyle and getting preventive care are important in promoting health and wellness.  Follow your health care provider's instructions about healthy diet, exercising, and getting tested or screened for diseases.  Follow your health care provider's instructions on monitoring your cholesterol and blood pressure. This information is not intended to replace advice given to you by your health care provider. Make sure you discuss any questions you have with your health care  provider. Document Released: 03/26/2008 Document Revised: 09/21/2018 Document Reviewed: 09/21/2018   CALL YOUR INSURANCE COMPANY TO SEE IF THEY WILL COVER A COLOGUARD/COLONOSCOPY SCREENING BEGINNING AT THE AGE OF 45 Elsevier Patient Education  [employer].

## 2019-09-05 ENCOUNTER — Other Ambulatory Visit: Payer: Self-pay | Admitting: Internal Medicine

## 2019-09-09 ENCOUNTER — Other Ambulatory Visit: Payer: Self-pay | Admitting: Nurse Practitioner

## 2019-09-12 ENCOUNTER — Telehealth: Payer: Self-pay

## 2019-09-12 ENCOUNTER — Other Ambulatory Visit: Payer: Self-pay | Admitting: Nurse Practitioner

## 2019-09-12 NOTE — Telephone Encounter (Signed)
I called patient to see if he is able to come by the office to have bloodwork done because it looks like he didn't stop he stated he did but was unable to give any blood at the time and he forgot he was supposed to come back he stated he would be by here tomorrow. YRL,RMA

## 2019-09-14 LAB — LIPID PANEL
Chol/HDL Ratio: 3.8 ratio (ref 0.0–5.0)
Cholesterol, Total: 217 mg/dL — ABNORMAL HIGH (ref 100–199)
HDL: 57 mg/dL (ref >39)
LDL Chol Calc (NIH): 133 mg/dL — ABNORMAL HIGH (ref 0–99)
Triglycerides: 151 mg/dL — ABNORMAL HIGH (ref 0–149)
VLDL Cholesterol Cal: 27 mg/dL (ref 5–40)

## 2019-09-14 LAB — CBC
Hematocrit: 42.9 % (ref 37.5–51.0)
Hemoglobin: 13.6 g/dL (ref 13.0–17.7)
MCH: 26.8 pg (ref 26.6–33.0)
MCHC: 31.7 g/dL (ref 31.5–35.7)
MCV: 85 fL (ref 79–97)
Platelets: 250 10*3/uL (ref 150–450)
RBC: 5.07 x10E6/uL (ref 4.14–5.80)
RDW: 14.6 % (ref 11.6–15.4)
WBC: 3.9 10*3/uL (ref 3.4–10.8)

## 2019-09-14 LAB — CMP14 + ANION GAP
ALT: 27 IU/L (ref 0–44)
AST: 28 IU/L (ref 0–40)
Albumin/Globulin Ratio: 1.4 (ref 1.2–2.2)
Albumin: 4.2 g/dL (ref 4.0–5.0)
Alkaline Phosphatase: 61 IU/L (ref 39–117)
Anion Gap: 16 mmol/L (ref 10.0–18.0)
BUN/Creatinine Ratio: 11 (ref 9–20)
BUN: 11 mg/dL (ref 6–24)
Bilirubin Total: 0.3 mg/dL (ref 0.0–1.2)
CO2: 25 mmol/L (ref 20–29)
Calcium: 9.3 mg/dL (ref 8.7–10.2)
Chloride: 98 mmol/L (ref 96–106)
Creatinine, Ser: 0.98 mg/dL (ref 0.76–1.27)
GFR calc Af Amer: 106 mL/min/{1.73_m2} (ref >59)
GFR calc non Af Amer: 92 mL/min/{1.73_m2} (ref >59)
Globulin, Total: 2.9 g/dL (ref 1.5–4.5)
Glucose: 99 mg/dL (ref 65–99)
Potassium: 3.9 mmol/L (ref 3.5–5.2)
Sodium: 139 mmol/L (ref 134–144)
Total Protein: 7.1 g/dL (ref 6.0–8.5)

## 2019-09-14 LAB — HEMOGLOBIN A1C
Est. average glucose Bld gHb Est-mCnc: 120 mg/dL
Hgb A1c MFr Bld: 5.8 % — ABNORMAL HIGH (ref 4.8–5.6)

## 2019-09-14 LAB — VITAMIN D 25 HYDROXY (VIT D DEFICIENCY, FRACTURES): Vit D, 25-Hydroxy: 36 ng/mL (ref 30.0–100.0)

## 2019-09-14 LAB — PSA: Prostate Specific Ag, Serum: 0.6 ng/mL (ref 0.0–4.0)

## 2019-10-30 ENCOUNTER — Other Ambulatory Visit: Payer: Self-pay | Admitting: Internal Medicine

## 2019-10-30 DIAGNOSIS — Z6841 Body Mass Index (BMI) 40.0 and over, adult: Secondary | ICD-10-CM

## 2019-10-30 DIAGNOSIS — J45909 Unspecified asthma, uncomplicated: Secondary | ICD-10-CM

## 2019-11-13 ENCOUNTER — Other Ambulatory Visit: Payer: Self-pay

## 2019-11-13 MED ORDER — PRAVASTATIN SODIUM 40 MG PO TABS: 40.0000 mg | ORAL_TABLET | Freq: Every evening | ORAL | 1 refills | Status: DC

## 2019-11-13 MED ORDER — HYDROCHLOROTHIAZIDE 12.5 MG PO TABS: 12.5000 mg | ORAL_TABLET | Freq: Every day | ORAL | 1 refills | Status: DC

## 2019-11-16 ENCOUNTER — Ambulatory Visit: Payer: Managed Care, Other (non HMO) | Admitting: Nurse Practitioner

## 2019-11-16 ENCOUNTER — Encounter: Payer: Self-pay | Admitting: Nurse Practitioner

## 2019-11-16 ENCOUNTER — Other Ambulatory Visit: Payer: Self-pay

## 2019-11-16 VITALS — BP 132/80 | HR 95 | Temp 98.2°F | Ht 68.4 in | Wt 315.6 lb

## 2019-11-16 DIAGNOSIS — Z6841 Body Mass Index (BMI) 40.0 and over, adult: Secondary | ICD-10-CM | POA: Diagnosis not present

## 2019-11-16 DIAGNOSIS — M545 Low back pain, unspecified: Secondary | ICD-10-CM

## 2019-11-16 MED ORDER — CYCLOBENZAPRINE HCL 10 MG PO TABS: 10.0000 mg | ORAL_TABLET | Freq: Three times a day (TID) | ORAL | 0 refills | Status: DC | PRN

## 2019-11-16 MED ORDER — PREDNISONE 10 MG (21) PO TBPK: ORAL_TABLET | ORAL | 0 refills | Status: DC

## 2019-11-16 NOTE — Patient Instructions (Signed)

## 2019-11-16 NOTE — Progress Notes (Signed)
This visit occurred during the SARS-CoV-2 public health emergency.  Safety protocols were in place, including screening questions prior to the visit, additional usage of staff PPE, and extensive cleaning of exam room while observing appropriate contact time as indicated for disinfecting solutions.  Subjective:     Patient ID: Troy Olson , male    DOB: [birth year] , 49 y.o.   MRN: [id number]   Chief Complaint  Patient presents with  . Hip Pain    patient stated he has been having some pain on his left lower side for the past month that comes     HPI  Wt Readings from Last 3 Encounters: 11/16/19 : (!) 315 lb 9.6 oz (143.2 kg) 07/27/19 : (!) 318 lb 3.2 oz (144.3 kg) 02/25/19 : (!) 310 lb (140.6 kg)   Back Pain This is a new problem. The current episode started more than 1 month ago (1 month ago). The problem has been waxing and waning since onset. The pain is present in the lumbar spine. Quality: stinging. The pain does not radiate. Pain scale: none now when he has pain it is a 7/10. Pertinent negatives include no chest pain or headaches. Risk factors include obesity, lack of exercise and sedentary lifestyle. He has tried NSAIDs for the symptoms.     Past Medical History:  Diagnosis Date  . Asthma   . Hypertension   . Sleep apnea      Family History  Problem Relation Age of Onset  . Hypertension Father        stents placed  . Cancer Sister      Current Outpatient Medications:  .  albuterol (VENTOLIN HFA) 108 (90 Base) MCG/ACT inhaler, USE 2 INHALATIONS BY MOUTH  INTO THE LUNGS EVERY 6  HOURS AS NEEDED FOR  WHEEZING, Disp: 18 g, Rfl: 0 .  Black Elderberry,Berry-Flower, 575 MG CAPS, Take by mouth. Take one tablet daily, Disp: , Rfl:  .  hydrochlorothiazide (HYDRODIURIL) 12.5 MG tablet, Take 1 tablet (12.5 mg total) by mouth daily., Disp: 90 tablet, Rfl: 1 .  loratadine (CLARITIN) 10 MG tablet, Take 10 mg by mouth daily as needed for allergies. , Disp: , Rfl:  .  montelukast  (SINGULAIR) 10 MG tablet, TAKE 1 TABLET BY MOUTH  DAILY, Disp: 90 tablet, Rfl: 1 .  Multiple Vitamins-Minerals (MULTIVITAMIN WITH MINERALS) tablet, Take 1 tablet by mouth daily., Disp: , Rfl:  .  naproxen (NAPROSYN) 500 MG tablet, Take 500 mg by mouth 2 (two) times daily with a meal., Disp: , Rfl: 1 .  nystatin (NYSTATIN) powder, Apply topically 4 (four) times daily., Disp: 15 g, Rfl: 0 .  pravastatin (PRAVACHOL) 40 MG tablet, Take 1 tablet (40 mg total) by mouth every evening., Disp: 90 tablet, Rfl: 1 .  VITAMIN D PO, Take 5,000 Units by mouth daily., Disp: , Rfl:  .  Cyanocobalamin (B-12 PO), Take 1 tablet by mouth daily., Disp: , Rfl:    Allergies  Allergen Reactions  . Food Allergy Formula Shortness Of Breath and Swelling    Any nuts of any kind.  . Peanut-Containing Drug Products Shortness Of Breath, Diarrhea, Nausea And Vomiting and Swelling     Review of Systems  Constitutional: Negative.   Respiratory: Negative.   Cardiovascular: Negative.  Negative for chest pain, palpitations and leg swelling.  Endocrine: Negative for polydipsia, polyphagia and polyuria.  Genitourinary: Positive for frequency.  Musculoskeletal: Positive for back pain.  Neurological: Negative for dizziness and headaches.  Today's Vitals   11/16/19 1557  BP: 132/80  Pulse: 95  Temp: 98.2 F (36.8 C)  TempSrc: Oral  Weight: (!) 315 lb 9.6 oz (143.2 kg)  Height: 5' 8.4" (1.737 m)  PainSc: 0-No pain  PainLoc: Back   Body mass index is 47.43 kg/m.   Objective:  Physical Exam Constitutional:      General: He is not in acute distress.    Appearance: Normal appearance. He is obese.  Cardiovascular:     Rate and Rhythm: Normal rate and regular rhythm.     Pulses: Normal pulses.     Heart sounds: Normal heart sounds. No murmur.  Pulmonary:     Effort: Pulmonary effort is normal. No respiratory distress.     Breath sounds: Normal breath sounds.  Musculoskeletal:        General: No tenderness.      Comments: Pain to left low back with straight leg raise.  Skin:    General: Skin is warm and dry.     Capillary Refill: Capillary refill takes less than 2 seconds.  Neurological:     General: No focal deficit present.     Mental Status: He is alert and oriented to person, place, and time.     Cranial Nerves: No cranial nerve deficit.  Psychiatric:        Mood and Affect: Mood normal.        Behavior: Behavior normal.        Thought Content: Thought content normal.        Judgment: Judgment normal.         Assessment And Plan:     1. Acute left-sided low back pain without sciatica  Sciatic nerve pain or muscle inflammation  Will treat with steroid dose pack and prn muscle relaxer - predniSONE (STERAPRED UNI-PAK 21 TAB) 10 MG (21) TBPK tablet; Take as directed  Dispense: 21 tablet; Refill: 0 - cyclobenzaprine (FLEXERIL) 10 MG tablet; Take 1 tablet (10 mg total) by mouth 3 (three) times daily as needed for muscle spasms.  Dispense: 30 tablet; Refill: 0  2. Class 3 severe obesity with body mass index (BMI) of 45.0 to 49.9 in adult, unspecified obesity type, unspecified whether serious comorbidity present ([place])  Chronic  Focus on healthy diet and regular exercise.   [name], FNP    THE PATIENT IS ENCOURAGED TO PRACTICE SOCIAL DISTANCING DUE TO THE COVID-19 PANDEMIC.

## 2019-12-13 ENCOUNTER — Other Ambulatory Visit: Payer: Self-pay

## 2019-12-13 DIAGNOSIS — M545 Low back pain, unspecified: Secondary | ICD-10-CM

## 2019-12-13 MED ORDER — NAPROXEN 500 MG PO TABS: 500.0000 mg | ORAL_TABLET | Freq: Two times a day (BID) | ORAL | 0 refills | Status: DC

## 2020-01-25 ENCOUNTER — Ambulatory Visit: Payer: Managed Care, Other (non HMO) | Admitting: Nurse Practitioner

## 2020-02-18 ENCOUNTER — Other Ambulatory Visit: Payer: Self-pay | Admitting: Nurse Practitioner

## 2020-03-12 ENCOUNTER — Ambulatory Visit: Payer: Managed Care, Other (non HMO) | Admitting: Nurse Practitioner

## 2020-03-21 ENCOUNTER — Ambulatory Visit: Payer: Managed Care, Other (non HMO) | Admitting: Nurse Practitioner

## 2020-05-03 ENCOUNTER — Other Ambulatory Visit: Payer: Self-pay | Admitting: Chiropractic Medicine

## 2020-05-03 ENCOUNTER — Ambulatory Visit
Admission: RE | Admit: 2020-05-03 | Discharge: 2020-05-03 | Disposition: A | Discharge status: Home / Self Care | Payer: Managed Care, Other (non HMO) | Source: Ambulatory Visit | Attending: Chiropractic Medicine | Admitting: Chiropractic Medicine

## 2020-05-03 DIAGNOSIS — R52 Pain, unspecified: Secondary | ICD-10-CM

## 2020-05-16 ENCOUNTER — Ambulatory Visit: Payer: Managed Care, Other (non HMO) | Admitting: Nurse Practitioner

## 2020-05-16 ENCOUNTER — Encounter: Payer: Self-pay | Admitting: Nurse Practitioner

## 2020-05-16 ENCOUNTER — Other Ambulatory Visit: Payer: Self-pay

## 2020-05-16 VITALS — BP 150/100 | HR 86 | Temp 98.2°F | Wt 328.2 lb

## 2020-05-16 DIAGNOSIS — E782 Mixed hyperlipidemia: Secondary | ICD-10-CM | POA: Diagnosis not present

## 2020-05-16 DIAGNOSIS — Z1159 Encounter for screening for other viral diseases: Secondary | ICD-10-CM

## 2020-05-16 DIAGNOSIS — Z139 Encounter for screening, unspecified: Secondary | ICD-10-CM

## 2020-05-16 DIAGNOSIS — M549 Dorsalgia, unspecified: Secondary | ICD-10-CM

## 2020-05-16 DIAGNOSIS — M545 Low back pain, unspecified: Secondary | ICD-10-CM

## 2020-05-16 DIAGNOSIS — Z6841 Body Mass Index (BMI) 40.0 and over, adult: Secondary | ICD-10-CM

## 2020-05-16 DIAGNOSIS — I10 Essential (primary) hypertension: Secondary | ICD-10-CM
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
## 2020-05-17 LAB — CMP14+EGFR
ALT: 23 IU/L (ref 0–44)
AST: 22 IU/L (ref 0–40)
Albumin/Globulin Ratio: 1.4 (ref 1.2–2.2)
Albumin: 4.5 g/dL (ref 4.0–5.0)
Alkaline Phosphatase: 65 IU/L (ref 48–121)
BUN/Creatinine Ratio: 14 (ref 9–20)
BUN: 15 mg/dL (ref 6–24)
Bilirubin Total: 0.2 mg/dL (ref 0.0–1.2)
CO2: 26 mmol/L (ref 20–29)
Calcium: 9.9 mg/dL (ref 8.7–10.2)
Chloride: 103 mmol/L (ref 96–106)
Creatinine, Ser: 1.05 mg/dL (ref 0.76–1.27)
GFR calc Af Amer: 97 mL/min/{1.73_m2} (ref >59)
GFR calc non Af Amer: 84 mL/min/{1.73_m2} (ref >59)
Globulin, Total: 3.2 g/dL (ref 1.5–4.5)
Glucose: 90 mg/dL (ref 65–99)
Potassium: 3.8 mmol/L (ref 3.5–5.2)
Sodium: 142 mmol/L (ref 134–144)
Total Protein: 7.7 g/dL (ref 6.0–8.5)

## 2020-05-17 LAB — SAR COV2 SEROLOGY (COVID19)AB(IGG),IA: DiaSorin SARS-CoV-2 Ab, IgG: POSITIVE

## 2020-05-17 LAB — LIPID PANEL
Chol/HDL Ratio: 4.2 ratio (ref 0.0–5.0)
Cholesterol, Total: 225 mg/dL — ABNORMAL HIGH (ref 100–199)
HDL: 54 mg/dL (ref >39)
LDL Chol Calc (NIH): 138 mg/dL — ABNORMAL HIGH (ref 0–99)
Triglycerides: 184 mg/dL — ABNORMAL HIGH (ref 0–149)
VLDL Cholesterol Cal: 33 mg/dL (ref 5–40)

## 2020-05-17 LAB — HEPATITIS C ANTIBODY: Hep C Virus Ab: <0.1 s/co ratio (ref 0.0–0.9)

## 2020-05-29 ENCOUNTER — Telehealth: Payer: Self-pay

## 2020-05-29 NOTE — Telephone Encounter (Signed)
I left pt v/m letting him know his forms are ready to be picked up. YL,RMA

## 2020-05-29 NOTE — Telephone Encounter (Signed)
Notified pt forms are ready for pick up he sill be by tomorrow or Monday to pick them up

## 2020-06-12 LAB — SARS-COV-2 SEMI-QUANTITATIVE TOTAL ANTIBODY, SPIKE
SARS-CoV-2 Semi-Quant Total Ab: >2500 U/mL (ref <0.8)
SARS-CoV-2 Spike Ab Interp: POSITIVE

## 2020-06-12 LAB — SPECIMEN STATUS REPORT

## 2020-07-02 ENCOUNTER — Other Ambulatory Visit: Payer: Self-pay

## 2020-07-02 ENCOUNTER — Ambulatory Visit: Payer: Managed Care, Other (non HMO) | Admitting: Nurse Practitioner

## 2020-07-02 ENCOUNTER — Encounter: Payer: Self-pay | Admitting: Nurse Practitioner

## 2020-07-02 VITALS — BP 138/88 | HR 73 | Temp 97.6°F | Ht 67.6 in | Wt 327.8 lb

## 2020-07-02 DIAGNOSIS — M546 Pain in thoracic spine: Secondary | ICD-10-CM

## 2020-07-02 DIAGNOSIS — R0602 Shortness of breath: Secondary | ICD-10-CM

## 2020-07-02 DIAGNOSIS — Z23 Encounter for immunization: Secondary | ICD-10-CM | POA: Diagnosis not present

## 2020-07-02 LAB — D-DIMER, QUANTITATIVE: D-DIMER: 0.39 mg/L FEU (ref 0.00–0.49)

## 2020-07-02 NOTE — Progress Notes (Signed)
I,Yamilka Roman [employer] as a Neurosurgeon for [employer], FNP.,have documented all relevant documentation on the behalf of [name], FNP,as directed by  [name], FNP while in the presence of [name], FNP.  This visit occurred during the SARS-CoV-2 public health emergency.  Safety protocols were in place, including screening questions prior to the visit, additional usage of staff PPE, and extensive cleaning of exam room while observing appropriate contact time as indicated for disinfecting solutions.  Subjective:     Patient ID: Troy Olson , male    DOB: 1971/01/18 , 49 y.o.   MRN: [id number]   Chief Complaint  Patient presents with  . side pain    patient stated he has been having some tightness and pain on his left side that started sunday. he stated it is hard for him to breathe     HPI  Complaining of left back/side pain since Sunday. He also had tingling to his left hand/arm.  He will have discomfort when he takes a deep breath.  He denies chest pain.      Past Medical History:  Diagnosis Date  . Asthma   . Hypertension   . Sleep apnea      Family History  Problem Relation Age of Onset  . Hypertension Father        stents placed  . Cancer Sister      Current Outpatient Medications:  .  hydrochlorothiazide (HYDRODIURIL) 12.5 MG tablet, Take 1 tablet (12.5 mg total) by mouth daily., Disp: 90 tablet, Rfl: 1 .  loratadine (CLARITIN) 10 MG tablet, Take 10 mg by mouth daily as needed for allergies. , Disp: , Rfl:  .  montelukast (SINGULAIR) 10 MG tablet, TAKE 1 TABLET BY MOUTH  DAILY, Disp: 90 tablet, Rfl: 3 .  Multiple Vitamins-Minerals (MULTIVITAMIN WITH MINERALS) tablet, Take 1 tablet by mouth daily., Disp: , Rfl:  .  naproxen (NAPROSYN) 500 MG tablet, Take 1 tablet (500 mg total) by mouth 2 (two) times daily with a meal., Disp: 30 tablet, Rfl: 0 .  pravastatin (PRAVACHOL) 40 MG tablet, Take 1 tablet (40 mg total) by mouth every evening., Disp: 90 tablet,  Rfl: 1 .  VITAMIN D PO, Take 5,000 Units by mouth daily., Disp: , Rfl:  .  predniSONE (STERAPRED UNI-PAK 21 TAB) 10 MG (21) TBPK tablet, Take as directed, Disp: 21 tablet, Rfl: 0   Allergies  Allergen Reactions  . Food Allergy Formula Shortness Of Breath and Swelling    Any nuts of any kind.  . Peanut-Containing Drug Products Shortness Of Breath, Diarrhea, Nausea And Vomiting and Swelling     Review of Systems  Constitutional: Negative.   Respiratory: Positive for cough and chest tightness. Negative for wheezing.   Cardiovascular: Negative.   Psychiatric/Behavioral: Negative.      Today's Vitals   07/02/20 1152  BP: 138/88  Pulse: 73  Temp: 97.6 F (36.4 C)  TempSrc: Oral  Weight: (!) 327 lb 12.8 oz (148.7 kg)  Height: 5' 7.6" (1.717 m)  PainSc: 6    Body mass index is 50.43 kg/m.   Objective:  Physical Exam Vitals reviewed.  Constitutional:      General: He is not in acute distress.    Appearance: Normal appearance.  Cardiovascular:     Rate and Rhythm: Normal rate and regular rhythm.     Pulses: Normal pulses.     Heart sounds: Normal heart sounds. No murmur heard.   Pulmonary:     Effort: Pulmonary  effort is normal. No respiratory distress.     Breath sounds: Normal breath sounds.  Neurological:     General: No focal deficit present.     Mental Status: He is alert and oriented to person, place, and time.     Cranial Nerves: No cranial nerve deficit.  Psychiatric:        Mood and Affect: Mood normal.        Behavior: Behavior normal.        Thought Content: Thought content normal.        Judgment: Judgment normal.         Assessment And Plan:     1. Shortness of breath  Will check d dimer for possible clot and send for chest xray  He does have a history of wheezing and has not used his albuterol inhaler  He is encouraged to use for the chest tightness - D-dimer, quantitative (not at [facility]) - DG Chest 2 View; Future  2. Acute left-sided thoracic  back pain  Pleuritic pain vs muscular - D-dimer, quantitative (not at [facility]) - DG Chest 2 View; Future  3. Need for influenza vaccination  Influenza vaccine administered  Encouraged to take Tylenol as needed for fever or muscle aches. - Flu Vaccine QUAD 6+ mos PF IM (Fluarix Quad PF)     Patient was given opportunity to ask questions. Patient verbalized understanding of the plan and was able to repeat key elements of the plan. All questions were answered to their satisfaction.    Jeanell Sparrow, FNP, have reviewed all documentation for this visit. The documentation on 07/11/20 for the exam, diagnosis, procedures, and orders are all accurate and complete.  THE PATIENT IS ENCOURAGED TO PRACTICE SOCIAL DISTANCING DUE TO THE COVID-19 PANDEMIC.

## 2020-07-02 NOTE — Patient Instructions (Addendum)
Take an aspirin 81 mg daily until further notice. Pending lab results may need to do a CT scan of your chest If your symptoms worsen go to ER for further evaluation  Influenza Virus Vaccine (Flucelvax) What is this medicine? INFLUENZA VIRUS VACCINE (in floo EN zuh VAHY ruhs vak SEEN) helps to reduce the risk of getting influenza also known as the flu. The vaccine only helps protect you against some strains of the flu. This medicine may be used for other purposes; ask your health care provider or pharmacist if you have questions. COMMON BRAND NAME(S): FLUCELVAX What should I tell my health care provider before I take this medicine? They need to know if you have any of these conditions:  bleeding disorder like hemophilia  fever or infection  Guillain-Barre syndrome or other neurological problems  immune system problems  infection with the human immunodeficiency virus (HIV) or AIDS  low blood platelet counts  multiple sclerosis  an unusual or allergic reaction to influenza virus vaccine, other medicines, foods, dyes or preservatives  pregnant or trying to get pregnant  breast-feeding How should I use this medicine? This vaccine is for injection into a muscle. It is given by a health care professional. A copy of Vaccine Information Statements will be given before each vaccination. Read this sheet carefully each time. The sheet may change frequently. Talk to your pediatrician regarding the use of this medicine in children. Special care may be needed. Overdosage: If you think you've taken too much of this medicine contact a poison control center or emergency room at once. Overdosage: If you think you have taken too much of this medicine contact a poison control center or emergency room at once. NOTE: This medicine is only for you. Do not share this medicine with others. What if I miss a dose? This does not apply. What may interact with this medicine?  chemotherapy or radiation  therapy  medicines that lower your immune system like etanercept, anakinra, infliximab, and adalimumab  medicines that treat or prevent blood clots like warfarin  phenytoin  steroid medicines like prednisone or cortisone  theophylline  vaccines This list may not describe all possible interactions. Give your health care provider a list of all the medicines, herbs, non-prescription drugs, or dietary supplements you use. Also tell them if you smoke, drink alcohol, or use illegal drugs. Some items may interact with your medicine. What should I watch for while using this medicine? Report any side effects that do not go away within 3 days to your doctor or health care professional. Call your health care provider if any unusual symptoms occur within 6 weeks of receiving this vaccine. You may still catch the flu, but the illness is not usually as bad. You cannot get the flu from the vaccine. The vaccine will not protect against colds or other illnesses that may cause fever. The vaccine is needed every year. What side effects may I notice from receiving this medicine? Side effects that you should report to your doctor or health care professional as soon as possible:  allergic reactions like skin rash, itching or hives, swelling of the face, lips, or tongue Side effects that usually do not require medical attention (Report these to your doctor or health care professional if they continue or are bothersome.):  fever  headache  muscle aches and pains  pain, tenderness, redness, or swelling at the injection site  tiredness This list may not describe all possible side effects. Call your doctor for medical advice  about side effects. You may report side effects to FDA at 1-800-FDA-1088. Where should I keep my medicine? The vaccine will be given by a health care professional in a clinic, pharmacy, doctor's office, or other health care setting. You will not be given vaccine doses to store at  home. NOTE: This sheet is a summary. It may not cover all possible information. If you have questions about this medicine, talk to your doctor, pharmacist, or health care provider.  2020 Elsevier/Gold Standard (2011-09-09 14:06:47)

## 2020-07-03 ENCOUNTER — Ambulatory Visit
Admission: RE | Admit: 2020-07-03 | Discharge: 2020-07-03 | Disposition: A | Discharge status: Home / Self Care | Payer: Managed Care, Other (non HMO) | Source: Ambulatory Visit | Attending: Nurse Practitioner | Admitting: Nurse Practitioner

## 2020-07-03 DIAGNOSIS — M546 Pain in thoracic spine: Secondary | ICD-10-CM

## 2020-07-03 DIAGNOSIS — R0602 Shortness of breath: Secondary | ICD-10-CM

## 2020-07-04 ENCOUNTER — Other Ambulatory Visit: Payer: Self-pay | Admitting: Nurse Practitioner

## 2020-07-04 MED ORDER — PREDNISONE 10 MG (21) PO TBPK: ORAL_TABLET | ORAL | 0 refills | Status: DC

## 2020-07-04 NOTE — Progress Notes (Signed)
I sent a prednisone taper to see if this helps

## 2020-08-01 ENCOUNTER — Ambulatory Visit: Payer: Managed Care, Other (non HMO) | Admitting: Nurse Practitioner

## 2020-08-01 ENCOUNTER — Encounter: Payer: Self-pay | Admitting: Nurse Practitioner

## 2020-08-01 ENCOUNTER — Other Ambulatory Visit: Payer: Self-pay

## 2020-08-01 VITALS — BP 144/88 | HR 74 | Temp 98.1°F | Ht 67.6 in | Wt 333.6 lb

## 2020-08-01 DIAGNOSIS — Z0000 Encounter for general adult medical examination without abnormal findings: Secondary | ICD-10-CM

## 2020-08-01 DIAGNOSIS — Z1211 Encounter for screening for malignant neoplasm of colon: Secondary | ICD-10-CM

## 2020-08-01 DIAGNOSIS — J4540 Moderate persistent asthma, uncomplicated: Secondary | ICD-10-CM

## 2020-08-01 DIAGNOSIS — E559 Vitamin D deficiency, unspecified: Secondary | ICD-10-CM

## 2020-08-01 DIAGNOSIS — Z114 Encounter for screening for human immunodeficiency virus [HIV]: Secondary | ICD-10-CM

## 2020-08-01 DIAGNOSIS — Z6841 Body Mass Index (BMI) 40.0 and over, adult: Secondary | ICD-10-CM

## 2020-08-01 DIAGNOSIS — E782 Mixed hyperlipidemia: Secondary | ICD-10-CM

## 2020-08-01 DIAGNOSIS — I10 Essential (primary) hypertension: Secondary | ICD-10-CM | POA: Diagnosis not present

## 2020-08-01 DIAGNOSIS — Z125 Encounter for screening for malignant neoplasm of prostate: Secondary | ICD-10-CM | POA: Diagnosis not present

## 2020-08-01 DIAGNOSIS — L819 Disorder of pigmentation, unspecified: Secondary | ICD-10-CM

## 2020-08-01 LAB — POCT URINALYSIS DIPSTICK
Bilirubin, UA: NEGATIVE
Glucose, UA: NEGATIVE
Ketones, UA: NEGATIVE
Leukocytes, UA: NEGATIVE
Nitrite, UA: NEGATIVE
Protein, UA: NEGATIVE
Spec Grav, UA: 1.02 (ref 1.010–1.025)
Urobilinogen, UA: 0.2 E.U./dL
pH, UA: 6 (ref 5.0–8.0)

## 2020-08-01 LAB — POCT UA - MICROALBUMIN
Albumin/Creatinine Ratio, Urine, POC: <30
Creatinine, POC: 100 mg/dL
Microalbumin Ur, POC: 10 mg/L

## 2020-08-01 MED ORDER — ALBUTEROL SULFATE HFA 108 (90 BASE) MCG/ACT IN AERS: 2.0000 | INHALATION_SPRAY | Freq: Four times a day (QID) | RESPIRATORY_TRACT | 5 refills | Status: DC | PRN

## 2020-08-01 NOTE — Patient Instructions (Addendum)
Hypertension, Adult Hypertension is another name for high blood pressure. High blood pressure forces your heart to work harder to pump blood. This can cause problems over time. There are two numbers in a blood pressure reading. There is a top number (systolic) over a bottom number (diastolic). It is best to have a blood pressure that is below 120/80. Healthy choices can help lower your blood pressure, or you may need medicine to help lower it. What are the causes? The cause of this condition is not known. Some conditions may be related to high blood pressure. What increases the risk?  Smoking.  Having type 2 diabetes mellitus, high cholesterol, or both.  Not getting enough exercise or physical activity.  Being overweight.  Having too much fat, sugar, calories, or salt (sodium) in your diet.  Drinking too much alcohol.  Having long-term (chronic) kidney disease.  Having a family history of high blood pressure.  Age. Risk increases with age.  Race. You may be at higher risk if you are African American.  Gender. Men are at higher risk than women before age 45. After age 65, women are at higher risk than men.  Having obstructive sleep apnea.  Stress. What are the signs or symptoms?  High blood pressure may not cause symptoms. Very high blood pressure (hypertensive crisis) may cause: ? Headache. ? Feelings of worry or nervousness (anxiety). ? Shortness of breath. ? Nosebleed. ? A feeling of being sick to your stomach (nausea). ? Throwing up (vomiting). ? Changes in how you see. ? Very bad chest pain. ? Seizures. How is this treated?  This condition is treated by making healthy lifestyle changes, such as: ? Eating healthy foods. ? Exercising more. ? Drinking less alcohol.  Your health care provider may prescribe medicine if lifestyle changes are not enough to get your blood pressure under control, and if: ? Your top number is above 130. ? Your bottom number is above  80.  Your personal target blood pressure may vary. Follow these instructions at home: Eating and drinking   If told, follow the DASH eating plan. To follow this plan: ? Fill one half of your plate at each meal with fruits and vegetables. ? Fill one fourth of your plate at each meal with whole grains. Whole grains include whole-wheat pasta, brown rice, and whole-grain bread. ? Eat or drink low-fat dairy products, such as skim milk or low-fat yogurt. ? Fill one fourth of your plate at each meal with low-fat (lean) proteins. Low-fat proteins include fish, chicken without skin, eggs, beans, and tofu. ? Avoid fatty meat, cured and processed meat, or chicken with skin. ? Avoid pre-made or processed food.  Eat less than 1,500 mg of salt each day.  Do not drink alcohol if: ? Your doctor tells you not to drink. ? You are pregnant, may be pregnant, or are planning to become pregnant.  If you drink alcohol: ? Limit how much you use to:  0-1 drink a day for women.  0-2 drinks a day for men. ? Be aware of how much alcohol is in your drink. In the [place] one drink equals one 12 oz bottle of beer (355 mL), one 5 oz glass of wine (148 mL), or one 1 oz glass of hard liquor (44 mL). Lifestyle   Work with your doctor to stay at a healthy weight or to lose weight. Ask your doctor what the best weight is for you.  Get at least 30 minutes of exercise most   days of the week. This may include walking, swimming, or biking.  Get at least 30 minutes of exercise that strengthens your muscles (resistance exercise) at least 3 days a week. This may include lifting weights or doing Pilates.  Do not use any products that contain nicotine or tobacco, such as cigarettes, e-cigarettes, and chewing tobacco. If you need help quitting, ask your doctor.  Check your blood pressure at home as told by your doctor.  Keep all follow-up visits as told by your doctor. This is important. Medicines  Take over-the-counter  and prescription medicines only as told by your doctor. Follow directions carefully.  Do not skip doses of blood pressure medicine. The medicine does not work as well if you skip doses. Skipping doses also puts you at risk for problems.  Ask your doctor about side effects or reactions to medicines that you should watch for. Contact a doctor if you:  Think you are having a reaction to the medicine you are taking.  Have headaches that keep coming back (recurring).  Feel dizzy.  Have swelling in your ankles.  Have trouble with your vision. Get help right away if you:  Get a very bad headache.  Start to feel mixed up (confused).  Feel weak or numb.  Feel faint.  Have very bad pain in your: ? Chest. ? Belly (abdomen).  Throw up more than once.  Have trouble breathing. Summary  Hypertension is another name for high blood pressure.  High blood pressure forces your heart to work harder to pump blood.  For most people, a normal blood pressure is less than 120/80.  Making healthy choices can help lower blood pressure. If your blood pressure does not get lower with healthy choices, you may need to take medicine. This information is not intended to replace advice given to you by your health care provider. Make sure you discuss any questions you have with your health care provider. Document Revised: 06/08/2018 Document Reviewed: 06/08/2018 Elsevier Patient Education  2020 Else[employer]. Health Maintenance, Male Adopting a healthy lifestyle and getting preventive care are important in promoting health and wellness. Ask your health care provider about:  The right schedule for you to have regular tests and exams.  Things you can do on your own to prevent diseases and keep yourself healthy. What should I know about diet, weight, and exercise? Eat a healthy diet   Eat a diet that includes plenty of vegetables, fruits, low-fat dairy products, and lean protein.  Do not eat a lot  of foods that are high in solid fats, added sugars, or sodium. Maintain a healthy weight Body mass index (BMI) is a measurement that can be used to identify possible weight problems. It estimates body fat based on height and weight. Your health care provider can help determine your BMI and help you achieve or maintain a healthy weight. Get regular exercise Get regular exercise. This is one of the most important things you can do for your health. Most adults should:  Exercise for at least 150 minutes each week. The exercise should increase your heart rate and make you sweat (moderate-intensity exercise).  Do strengthening exercises at least twice a week. This is in addition to the moderate-intensity exercise.  Spend less time sitting. Even light physical activity can be beneficial. Watch cholesterol and blood lipids Have your blood tested for lipids and cholesterol at 47 years of age, then have this test every 5 years. You may need to have your cholesterol levels checked   more often if:  Your lipid or cholesterol levels are high.  You are older than 47 years of age.  You are at high risk for heart disease. What should I know about cancer screening? Many types of cancers can be detected early and may often be prevented. Depending on your health history and family history, you may need to have cancer screening at various ages. This may include screening for:  Colorectal cancer.  Prostate cancer.  Skin cancer.  Lung cancer. What should I know about heart disease, diabetes, and high blood pressure? Blood pressure and heart disease  High blood pressure causes heart disease and increases the risk of stroke. This is more likely to develop in people who have high blood pressure readings, are of African descent, or are overweight.  Talk with your health care provider about your target blood pressure readings.  Have your blood pressure checked: ? Every 3-5 years if you are 18-39 years of  age. ? Every year if you are 19 years old or older.  If you are between the ages of 21 and 4 and are a current or former smoker, ask your health care provider if you should have a one-time screening for abdominal aortic aneurysm (AAA). Diabetes Have regular diabetes screenings. This checks your fasting blood sugar level. Have the screening done:  Once every three years after age 4 if you are at a normal weight and have a low risk for diabetes.  More often and at a younger age if you are overweight or have a high risk for diabetes. What should I know about preventing infection? Hepatitis B If you have a higher risk for hepatitis B, you should be screened for this virus. Talk with your health care provider to find out if you are at risk for hepatitis B infection. Hepatitis C Blood testing is recommended for:  Everyone born from [birth year] through 1963.  Anyone with known risk factors for hepatitis C. Sexually transmitted infections (STIs)  You should be screened each year for STIs, including gonorrhea and chlamydia, if: ? You are sexually active and are younger than 47 years of age. ? You are older than 47 years of age and your health care provider tells you that you are at risk for this type of infection. ? Your sexual activity has changed since you were last screened, and you are at increased risk for chlamydia or gonorrhea. Ask your health care provider if you are at risk.  Ask your health care provider about whether you are at high risk for HIV. Your health care provider may recommend a prescription medicine to help prevent HIV infection. If you choose to take medicine to prevent HIV, you should first get tested for HIV. You should then be tested every 3 months for as long as you are taking the medicine. Follow these instructions at home: Lifestyle  Do not use any products that contain nicotine or tobacco, such as cigarettes, e-cigarettes, and chewing tobacco. If you need help quitting,  ask your health care provider.  Do not use street drugs.  Do not share needles.  Ask your health care provider for help if you need support or information about quitting drugs. Alcohol use  Do not drink alcohol if your health care provider tells you not to drink.  If you drink alcohol: ? Limit how much you have to 0-2 drinks a day. ? Be aware of how much alcohol is in your drink. In the [place] one drink equals one 12  oz bottle of beer (355 mL), one 5 oz glass of wine (148 mL), or one 1 oz glass of hard liquor (44 mL). General instructions  Schedule regular health, dental, and eye exams.  Stay current with your vaccines.  Tell your health care provider if: ? You often feel depressed. ? You have ever been abused or do not feel safe at home. Summary  Adopting a healthy lifestyle and getting preventive care are important in promoting health and wellness.  Follow your health care provider's instructions about healthy diet, exercising, and getting tested or screened for diseases.  Follow your health care provider's instructions on monitoring your cholesterol and blood pressure. This information is not intended to replace advice given to you by your health care provider. Make sure you discuss any questions you have with your health care provider. Document Revised: 09/21/2018 Document Reviewed: 09/21/2018 Elsevier Patient Education  2020 [employer].   Exercising to Lose Weight Exercise is structured, repetitive physical activity to improve fitness and health. Getting regular exercise is important for everyone. It is especially important if you are overweight. Being overweight increases your risk of heart disease, stroke, diabetes, high blood pressure, and several types of cancer. Reducing your calorie intake and exercising can help you lose weight. Exercise is usually categorized as moderate or vigorous intensity. To lose weight, most people need to do a certain amount of  moderate-intensity or vigorous-intensity exercise each week. Moderate-intensity exercise  Moderate-intensity exercise is any activity that gets you moving enough to burn at least three times more energy (calories) than if you were sitting. Examples of moderate exercise include:  Walking a mile in 15 minutes.  Doing light yard work.  Biking at an easy pace. Most people should get at least 150 minutes (2 hours and 30 minutes) a week of moderate-intensity exercise to maintain their body weight. Vigorous-intensity exercise Vigorous-intensity exercise is any activity that gets you moving enough to burn at least six times more calories than if you were sitting. When you exercise at this intensity, you should be working hard enough that you are not able to carry on a conversation. Examples of vigorous exercise include:  Running.  Playing a team sport, such as football, basketball, and soccer.  Jumping rope. Most people should get at least 75 minutes (1 hour and 15 minutes) a week of vigorous-intensity exercise to maintain their body weight. How can exercise affect me? When you exercise enough to burn more calories than you eat, you lose weight. Exercise also reduces body fat and builds muscle. The more muscle you have, the more calories you burn. Exercise also:  Improves mood.  Reduces stress and tension.  Improves your overall fitness, flexibility, and endurance.  Increases bone strength. The amount of exercise you need to lose weight depends on:  Your age.  The type of exercise.  Any health conditions you have.  Your overall physical ability. Talk to your health care provider about how much exercise you need and what types of activities are safe for you. What actions can I take to lose weight? Nutrition   Make changes to your diet as told by your health care provider or diet and nutrition specialist (dietitian). This may include: ? Eating fewer calories. ? Eating more  protein. ? Eating less unhealthy fats. ? Eating a diet that includes fresh fruits and vegetables, whole grains, low-fat dairy products, and lean protein. ? Avoiding foods with added fat, salt, and sugar.  Drink plenty of water while you exercise  to prevent dehydration or heat stroke. Activity  Choose an activity that you enjoy and set realistic goals. Your health care provider can help you make an exercise plan that works for you.  Exercise at a moderate or vigorous intensity most days of the week. ? The intensity of exercise may vary from person to person. You can tell how intense a workout is for you by paying attention to your breathing and heartbeat. Most people will notice their breathing and heartbeat get faster with more intense exercise.  Do resistance training twice each week, such as: ? Push-ups. ? Sit-ups. ? Lifting weights. ? Using resistance bands.  Getting short amounts of exercise can be just as helpful as long structured periods of exercise. If you have trouble finding time to exercise, try to include exercise in your daily routine. ? Get up, stretch, and walk around every 30 minutes throughout the day. ? Go for a walk during your lunch break. ? Park your car farther away from your destination. ? If you take public transportation, get off one stop early and walk the rest of the way. ? Make phone calls while standing up and walking around. ? Take the stairs instead of elevators or escalators.  Wear comfortable clothes and shoes with good support.  Do not exercise so much that you hurt yourself, feel dizzy, or get very short of breath. Where to find more information  [place] Department of Health and Human Services: ThisPath.fi  Centers for Disease Control and Prevention (CDC): [employer]Exhibition.com.br Contact a health care provider:  Before starting a new exercise program.  If you have questions or concerns about your weight.  If you have a medical problem that keeps you from  exercising. Get help right away if you have any of the following while exercising:  Injury.  Dizziness.  Difficulty breathing or shortness of breath that does not go away when you stop exercising.  Chest pain.  Rapid heartbeat. Summary  Being overweight increases your risk of heart disease, stroke, diabetes, high blood pressure, and several types of cancer.  Losing weight happens when you burn more calories than you eat.  Reducing the amount of calories you eat in addition to getting regular moderate or vigorous exercise each week helps you lose weight. This information is not intended to replace advice given to you by your health care provider. Make sure you discuss any questions you have with your health care provider. Document Revised: 10/11/2017 Document Reviewed: 10/11/2017 Elsevier Patient Education  2020 [employer].

## 2020-08-01 NOTE — Progress Notes (Signed)
Rutherford Nail as a scribe for [name], FNP.,have documented all relevant documentation on the behalf of [name], FNP,as directed by  [name], FNP while in the presence of [name], [place]. This visit occurred during the SARS-CoV-2 public health emergency.  Safety protocols were in place, including screening questions prior to the visit, additional usage of staff PPE, and extensive cleaning of exam room while observing appropriate contact time as indicated for disinfecting solutions.  Subjective:     Patient ID: Troy Olson , male    DOB: Apr 27, 1971 , 49 y.o.   MRN: [id number]   Chief Complaint  Patient presents with  . Annual Exam    HPI  Here for HM  Wt Readings from Last 3 Encounters: 08/01/20 : (!) 333 lb 9.6 oz (151.3 kg) 07/02/20 : (!) 327 lb 12.8 oz (148.7 kg) 05/16/20 : (!) 328 lb 3.2 oz (148.9 kg)  He does admit  To eating more junk food.  He is interested in going to weight management to help with his diet        Hypertension The current episode started more than 1 year ago. The problem is controlled. Pertinent negatives include no anxiety or palpitations. There are no associated agents to hypertension. Risk factors for coronary artery disease include obesity, male gender, dyslipidemia and sedentary lifestyle. There are no compliance problems.  There is no history of angina or kidney disease.     Past Medical History:  Diagnosis Date  . Asthma   . Hypertension   . Sleep apnea      Family History  Problem Relation Age of Onset  . Hypertension Father        stents placed  . Cancer Sister      Current Outpatient Medications:  .  hydrochlorothiazide (HYDRODIURIL) 12.5 MG tablet, Take 1 tablet (12.5 mg total) by mouth daily., Disp: 90 tablet, Rfl: 1 .  montelukast (SINGULAIR) 10 MG tablet, TAKE 1 TABLET BY MOUTH  DAILY, Disp: 90 tablet, Rfl: 3 .  Multiple Vitamins-Minerals (MULTIVITAMIN WITH MINERALS) tablet, Take 1 tablet by mouth  daily., Disp: , Rfl:  .  naproxen (NAPROSYN) 500 MG tablet, Take 1 tablet (500 mg total) by mouth 2 (two) times daily with a meal., Disp: 30 tablet, Rfl: 0 .  pravastatin (PRAVACHOL) 40 MG tablet, Take 1 tablet (40 mg total) by mouth every evening., Disp: 90 tablet, Rfl: 1 .  VITAMIN D PO, Take 5,000 Units by mouth daily., Disp: , Rfl:  .  albuterol (VENTOLIN HFA) 108 (90 Base) MCG/ACT inhaler, Inhale 2 puffs into the lungs every 6 (six) hours as needed for wheezing or shortness of breath., Disp: 8 g, Rfl: 5 .  metFORMIN (GLUCOPHAGE) 500 MG tablet, Take 1 tablet (500 mg total) by mouth 2 (two) times daily with a meal., Disp: 60 tablet, Rfl: 2   Allergies  Allergen Reactions  . Food Allergy Formula Shortness Of Breath and Swelling    Any nuts of any kind.  . Peanut-Containing Drug Products Shortness Of Breath, Diarrhea, Nausea And Vomiting and Swelling     Men's preventive visit. Patient Health Questionnaire (PHQ-2) is    Office Visit from 11/16/2019 in [facility]  PHQ-2 Total Score 0     Patient is on a regular diet. Exercises a 1-2 days a week sometimes by walkingon the treadmill.  Marital status: Married. Relevant history for alcohol use is:  Social History   Substance and Sexual Activity  Alcohol Use Yes  Comment: occasionally--dark liquer 4-5 drinks consumed weekly   Relevant history for tobacco use is:  Social History   Tobacco Use  Smoking Status Former Smoker  . Packs/day: 0.50  . Years: 20.00  . Pack years: 10.00  . Types: Cigarettes  . Quit date: 03/13/2011  . Years since quitting: 9.4  Smokeless Tobacco Never Used  .   Review of Systems  Constitutional: Negative.   HENT: Negative.   Eyes: Negative.   Respiratory: Negative.  Negative for cough.   Cardiovascular: Negative.  Negative for palpitations and leg swelling.  Gastrointestinal: Negative.   Endocrine: Negative.   Genitourinary: Negative.   Musculoskeletal: Negative.    Allergic/Immunologic: Negative.   Neurological: Negative.   Hematological: Negative.   Psychiatric/Behavioral: Negative.      Today's Vitals   08/01/20 0907  BP: (!) 144/88  Pulse: 74  Temp: 98.1 F (36.7 C)  TempSrc: Oral  Weight: (!) 333 lb 9.6 oz (151.3 kg)  Height: 5' 7.6" (1.717 m)  PainSc: 0-No pain   Body mass index is 51.33 kg/m.   Objective:  Physical Exam Vitals reviewed.  Constitutional:      Appearance: Normal appearance. He is obese.  HENT:     Head: Normocephalic and atraumatic.     Right Ear: Tympanic membrane, ear canal and external ear normal. There is no impacted cerumen.     Left Ear: Tympanic membrane, ear canal and external ear normal. There is no impacted cerumen.  Cardiovascular:     Rate and Rhythm: Normal rate and regular rhythm.     Pulses: Normal pulses.     Heart sounds: Normal heart sounds. No murmur heard.   Pulmonary:     Effort: Pulmonary effort is normal. No respiratory distress.     Breath sounds: Normal breath sounds. No wheezing.  Abdominal:     General: Abdomen is flat. Bowel sounds are normal. There is no distension.     Palpations: Abdomen is soft.  Genitourinary:    Prostate: Normal.     Rectum: Guaiac result negative.  Musculoskeletal:        General: Normal range of motion.     Cervical back: Normal range of motion and neck supple.  Skin:    General: Skin is warm.     Capillary Refill: Capillary refill takes less than 2 seconds.     Comments: Hypopigmentation to his lower abdomen at the panus area where his belt buckle sits.   Neurological:     General: No focal deficit present.     Mental Status: He is alert and oriented to person, place, and time.  Psychiatric:        Mood and Affect: Mood normal.        Behavior: Behavior normal.        Thought Content: Thought content normal.        Judgment: Judgment normal.         Assessment And Plan:    1. Health maintenance examination . Behavior modifications  discussed and diet history reviewed.   . Pt will continue to exercise regularly and modify diet with low GI, plant based foods and decrease intake of processed foods.  . Recommend intake of daily multivitamin, Vitamin D, and calcium.  . Recommend colonoscopy for preventive screenings, as well as recommend immunizations that include influenza, TDAP - CBC  2. Essential hypertension  Chronic, slightly elevated blood pressure  I have discussed the importance of avoiding a high salt diet  EKG done with  NSR - CMP14+EGFR - POCT Urinalysis Dipstick (81002) - POCT UA - Microalbumin - EKG 12-Lead  3. Moderate persistent asthma without complication   Stable, continue with current medications  4. Encounter for screening colonoscopy  According to USPTF Colorectal cancer Screening guidelines, most recent guidelines are to be screened after age 86. Colonoscopy is recommended every 10 years, starting at age 74years.  Will refer to GI for colon cancer screening. - Ambulatory referral to Gastroenterology  5. Encounter for prostate cancer screening  Deferred at patient request, will check PSA level - PSA  6. Vitamin D deficiency  Will check vitamin D level and supplement as needed.     Also encouraged to spend 15 minutes in the sun daily.  - VITAMIN D 25 Hydroxy (Vit-D Deficiency, Fractures)  7. Mixed hyperlipidemia  Chronic, controlled  Continue with current medications - Lipid panel  8. BMI 50.0-59.9, adult (HCC)  Chronic  Discussed healthy diet and regular exercise options   Encouraged to exercise at least 150 minutes per week with 2 days of strength training. - Hemoglobin A1c Wt Readings from Last 3 Encounters:  08/01/20 (!) 333 lb 9.6 oz (151.3 kg)  07/02/20 (!) 327 lb 12.8 oz (148.7 kg)  05/16/20 (!) 328 lb 3.2 oz (148.9 kg)    9. Encounter for HIV (human immunodeficiency virus) test - HIV Antibody (routine testing w rflx)    Patient was given opportunity to ask  questions. Patient verbalized understanding of the plan and was able to repeat key elements of the plan. All questions were answered to their satisfaction.   [name], FNP   I, [name], FNP, have reviewed all documentation for this visit. The documentation on 08/08/20 for the exam, diagnosis, procedures, and orders are all accurate and complete.  THE PATIENT IS ENCOURAGED TO PRACTICE SOCIAL DISTANCING DUE TO THE COVID-19 PANDEMIC.

## 2020-08-02 LAB — CBC
Hematocrit: 41.9 % (ref 37.5–51.0)
Hemoglobin: 13.6 g/dL (ref 13.0–17.7)
MCH: 27.5 pg (ref 26.6–33.0)
MCHC: 32.5 g/dL (ref 31.5–35.7)
MCV: 85 fL (ref 79–97)
Platelets: 259 10*3/uL (ref 150–450)
RBC: 4.94 x10E6/uL (ref 4.14–5.80)
RDW: 14 % (ref 11.6–15.4)
WBC: 4.2 10*3/uL (ref 3.4–10.8)

## 2020-08-02 LAB — HEMOGLOBIN A1C
Est. average glucose Bld gHb Est-mCnc: 126 mg/dL
Hgb A1c MFr Bld: 6 % — ABNORMAL HIGH (ref 4.8–5.6)

## 2020-08-02 LAB — HIV ANTIBODY (ROUTINE TESTING W REFLEX): HIV Screen 4th Generation wRfx: NONREACTIVE

## 2020-08-02 LAB — VITAMIN D 25 HYDROXY (VIT D DEFICIENCY, FRACTURES): Vit D, 25-Hydroxy: 38.3 ng/mL (ref 30.0–100.0)

## 2020-08-02 LAB — PSA: Prostate Specific Ag, Serum: 0.6 ng/mL (ref 0.0–4.0)

## 2020-08-06 ENCOUNTER — Other Ambulatory Visit: Payer: Self-pay | Admitting: Nurse Practitioner

## 2020-08-06 MED ORDER — METFORMIN HCL 500 MG PO TABS: 500.0000 mg | ORAL_TABLET | Freq: Two times a day (BID) | ORAL | 2 refills | Status: DC

## 2020-08-06 NOTE — Progress Notes (Signed)
I have sent metformin 500 mg take one time a day for 2 weeks then increase to 1 tab twice a day. This may cause GI upset, allow time for your body to adjust.  Schedule a 6 week medication follow up

## 2020-09-19 ENCOUNTER — Ambulatory Visit: Payer: Self-pay | Admitting: Nurse Practitioner

## 2020-09-30 ENCOUNTER — Other Ambulatory Visit: Payer: Self-pay | Admitting: Nurse Practitioner

## 2020-09-30 ENCOUNTER — Ambulatory Visit: Payer: Managed Care, Other (non HMO) | Admitting: Internal Medicine

## 2020-09-30 ENCOUNTER — Encounter: Payer: Self-pay | Admitting: Internal Medicine

## 2020-09-30 DIAGNOSIS — R7303 Prediabetes: Secondary | ICD-10-CM | POA: Diagnosis not present

## 2020-09-30 DIAGNOSIS — T50905A Adverse effect of unspecified drugs, medicaments and biological substances, initial encounter: Secondary | ICD-10-CM

## 2020-09-30 DIAGNOSIS — R197 Diarrhea, unspecified: Secondary | ICD-10-CM

## 2020-09-30 DIAGNOSIS — Z1211 Encounter for screening for malignant neoplasm of colon: Secondary | ICD-10-CM

## 2020-09-30 NOTE — Patient Instructions (Signed)
As we discussed - if you can get your BMI < 50 can have colonoscopy in this building.    The main problem with people like you with obesity is often insulin resistance due to increased weight, especially belly fat. You need to get insulin levels lower by eating less carbs and especially processed carbs.  I have found the following websites and apps helpful  www.dietdoctor.com is a wonderful website to guide you to do lower carb and restricted feeding time and intermittent fasting.  A great book is The Obesity Code by Dr. Wylene Simmer ( a little dry but tells you all you need to know and do)  Dr. Shanda Howells runs the [employer] - he is at [facility].  I will send you a video to watch also - via text.  See you in February and remember - don't get in a hurry but do try to make eating changes.  I appreciate the opportunity to care for you. Iva Boop, MD, [facility]

## 2020-09-30 NOTE — Progress Notes (Signed)
Troy Olson 49 y.o. 31-May-1971 [id number]  Assessment & Plan:   Encounter Diagnoses  Name Primary?  . Obesity, morbid, BMI 50 or higher (HCC) Yes  . Prediabetes   . Colon cancer screening   . Medication side effect, initial encounter -metformin loose stools     It is appropriate for him to have screening for colon cancer.  Colonoscopy is a very appropriate test we did review the stool based options but he and his primary care team are inclined to pursue colonoscopy and I support that.  At this point with his BMI over 50 his procedure would have to be done at the hospital.  People in that weight range without BMI needed greater safety net and are not candidates to have colonoscopy in an ambulatory surgery center such as our endoscopy center here at [facility].  We talked about how if he could lose weight he would fall under that BMI of 50 category and he could have his procedure here and though a normal screening colonoscopy should not cause the patient anything we cannot predict that and the hospital cost is 3 times as much and the only thing forcing him to go there now is the BMI above 50.  We talked about lower carb eating and changing eating habits and I have given him information about the diet doctor website and other low-carb on line treatment and educational formats.  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  Exercises encouraged as well though I explained energy expenditure will really not result in significant and sustained weight loss that he must change how he eats.  We had a good conversation and he seems motivated and he will try to move to a low-carb eating situation and even consider intermittent fasting and return to me in about 8 weeks at which point we will reassess his BMI and determine location of colonoscopy procedure.  Note also we will limit clothing and extra weight as much as possible  when he is weighed the next time.  I appreciate the opportunity to care for this patient. CC: Dorothyann Peng, MD     Subjective:   Chief Complaint: Colon cancer screening, diarrhea on Metformin  HPI 49 year old African-American man with hypertension and dyslipidemia as well as asthma who was recently started on Metformin for prediabetes with hemoglobin going from 5.8 to 6%.  Other problems include morbid obesity with BMI greater than 50.  He was sent for screening colonoscopy.  When he was called about his appointment he was complaining of diarrhea and that was right when he had started Metformin.  Since that time he is adapted to that and its not too much of a problem.  He was started on Metformin for prediabetes.  He has no other active GI symptoms at this time.  He is incentivize and motivated to lose weight and to exercise again.  He reports that he has gained a lot of weight due to some situational stressors his father had been ill and his eating habits changed and he says this is the largest he has been.  Wt Readings from Last 3 Encounters:  09/30/20 (!) 336 lb 2 oz (152.5 kg)  08/01/20 (!) 333 lb 9.6 oz (151.3 kg)  07/02/20 (!) 327 lb 12.8 oz (148.7 kg)   He has had BMIs in the low 40s in 2018 and 2019 but then has had  fluctuating up and down mostly up lately. Allergies  Allergen Reactions  . Food Allergy Formula Shortness Of Breath and Swelling    Any nuts of any kind.  . Peanut-Containing Drug Products Shortness Of Breath, Diarrhea, Nausea And Vomiting and Swelling   Current Meds  Medication Sig  . albuterol (VENTOLIN HFA) 108 (90 Base) MCG/ACT inhaler Inhale 2 puffs into the lungs every 6 (six) hours as needed for wheezing or shortness of breath.  . hydrochlorothiazide (HYDRODIURIL) 12.5 MG tablet Take 1 tablet (12.5 mg total) by mouth daily.  . metFORMIN (GLUCOPHAGE) 500 MG tablet Take 1 tablet (500 mg total) by mouth 2 (two) times daily with a meal.  . montelukast  (SINGULAIR) 10 MG tablet TAKE 1 TABLET BY MOUTH  DAILY  . Multiple Vitamins-Minerals (MULTIVITAMIN WITH MINERALS) tablet Take 1 tablet by mouth daily.  . naproxen (NAPROSYN) 500 MG tablet Take 1 tablet (500 mg total) by mouth 2 (two) times daily with a meal.  . VITAMIN D PO Take 5,000 Units by mouth daily.  . [DISCONTINUED] pravastatin (PRAVACHOL) 40 MG tablet Take 1 tablet (40 mg total) by mouth every evening.   Past Medical History:  Diagnosis Date  . Asthma   . HLD (hyperlipidemia)   . Hypertension   . Morbid obesity (HCC)   . Prediabetes   . Sleep apnea    CPAP   Past Surgical History:  Procedure Laterality Date  . NO PAST SURGERIES     Social History   Social History Narrative   Patient is married with 2 sons and 1 daughter   He owns a dump truck   Former smoker   3 alcoholic beverages daily   1 caffeinated beverage daily   No drug use or other tobacco   family history includes Asthma in his son; Cancer in his sister; Heart disease in his father; Hypertension in his father; Ovarian cancer in his sister.   Review of Systems   Objective:   Physical Exam BP (!) 160/102 (BP Location: Left Arm, Patient Position: Sitting, Cuff Size: Normal)   Pulse 84   Ht 5\' 8"  (1.727 m) Comment: height measured without shoes  Wt (!) 336 lb 2 oz (152.5 kg)   BMI 51.11 kg/m

## 2020-10-30 ENCOUNTER — Other Ambulatory Visit: Payer: Self-pay | Admitting: Nurse Practitioner

## 2020-10-31 ENCOUNTER — Encounter (INDEPENDENT_AMBULATORY_CARE_PROVIDER_SITE_OTHER): Payer: Self-pay | Admitting: Family Medicine

## 2020-10-31 ENCOUNTER — Other Ambulatory Visit: Payer: Self-pay

## 2020-10-31 ENCOUNTER — Ambulatory Visit (INDEPENDENT_AMBULATORY_CARE_PROVIDER_SITE_OTHER): Payer: Managed Care, Other (non HMO) | Admitting: Family Medicine

## 2020-10-31 VITALS — BP 148/84 | HR 75 | Temp 97.9°F | Ht 68.0 in | Wt 325.0 lb

## 2020-10-31 DIAGNOSIS — E7849 Other hyperlipidemia: Secondary | ICD-10-CM | POA: Diagnosis not present

## 2020-10-31 DIAGNOSIS — I10 Essential (primary) hypertension: Secondary | ICD-10-CM

## 2020-10-31 DIAGNOSIS — Z1331 Encounter for screening for depression: Secondary | ICD-10-CM | POA: Diagnosis not present

## 2020-10-31 DIAGNOSIS — J45909 Unspecified asthma, uncomplicated: Secondary | ICD-10-CM

## 2020-10-31 DIAGNOSIS — Z72821 Inadequate sleep hygiene: Secondary | ICD-10-CM

## 2020-10-31 DIAGNOSIS — Z9989 Dependence on other enabling machines and devices: Secondary | ICD-10-CM

## 2020-10-31 DIAGNOSIS — R7303 Prediabetes: Secondary | ICD-10-CM

## 2020-10-31 DIAGNOSIS — R0602 Shortness of breath: Secondary | ICD-10-CM | POA: Diagnosis not present

## 2020-10-31 DIAGNOSIS — Z9189 Other specified personal risk factors, not elsewhere classified: Secondary | ICD-10-CM | POA: Diagnosis not present

## 2020-10-31 DIAGNOSIS — F3289 Other specified depressive episodes: Secondary | ICD-10-CM

## 2020-10-31 DIAGNOSIS — Z91018 Allergy to other foods: Secondary | ICD-10-CM

## 2020-10-31 DIAGNOSIS — Z0289 Encounter for other administrative examinations: Secondary | ICD-10-CM

## 2020-10-31 DIAGNOSIS — R5383 Other fatigue: Secondary | ICD-10-CM

## 2020-10-31 DIAGNOSIS — G4733 Obstructive sleep apnea (adult) (pediatric): Secondary | ICD-10-CM

## 2020-10-31 DIAGNOSIS — Z6841 Body Mass Index (BMI) 40.0 and over, adult: Secondary | ICD-10-CM

## 2020-11-01 LAB — CBC WITH DIFFERENTIAL/PLATELET
Basophils Absolute: 0 10*3/uL (ref 0.0–0.2)
Basos: 1 %
EOS (ABSOLUTE): 0.1 10*3/uL (ref 0.0–0.4)
Eos: 3 %
Hematocrit: 42.3 % (ref 37.5–51.0)
Hemoglobin: 14.1 g/dL (ref 13.0–17.7)
Immature Grans (Abs): 0 10*3/uL (ref 0.0–0.1)
Immature Granulocytes: 0 %
Lymphocytes Absolute: 1 10*3/uL (ref 0.7–3.1)
Lymphs: 32 %
MCH: 27.9 pg (ref 26.6–33.0)
MCHC: 33.3 g/dL (ref 31.5–35.7)
MCV: 84 fL (ref 79–97)
Monocytes Absolute: 0.3 10*3/uL (ref 0.1–0.9)
Monocytes: 8 %
Neutrophils Absolute: 1.8 10*3/uL (ref 1.4–7.0)
Neutrophils: 56 %
Platelets: 259 10*3/uL (ref 150–450)
RBC: 5.06 x10E6/uL (ref 4.14–5.80)
RDW: 14.3 % (ref 11.6–15.4)
WBC: 3.2 10*3/uL — ABNORMAL LOW (ref 3.4–10.8)

## 2020-11-01 LAB — COMPREHENSIVE METABOLIC PANEL
ALT: 25 IU/L (ref 0–44)
AST: 27 IU/L (ref 0–40)
Albumin/Globulin Ratio: 1.5 (ref 1.2–2.2)
Albumin: 4.7 g/dL (ref 4.0–5.0)
Alkaline Phosphatase: 66 IU/L (ref 44–121)
BUN/Creatinine Ratio: 12 (ref 9–20)
BUN: 12 mg/dL (ref 6–24)
Bilirubin Total: 0.2 mg/dL (ref 0.0–1.2)
CO2: 24 mmol/L (ref 20–29)
Calcium: 9.2 mg/dL (ref 8.7–10.2)
Chloride: 101 mmol/L (ref 96–106)
Creatinine, Ser: 1.01 mg/dL (ref 0.76–1.27)
GFR calc Af Amer: 102 mL/min/{1.73_m2} (ref >59)
GFR calc non Af Amer: 88 mL/min/{1.73_m2} (ref >59)
Globulin, Total: 3.2 g/dL (ref 1.5–4.5)
Glucose: 94 mg/dL (ref 65–99)
Potassium: 4.1 mmol/L (ref 3.5–5.2)
Sodium: 139 mmol/L (ref 134–144)
Total Protein: 7.9 g/dL (ref 6.0–8.5)

## 2020-11-01 LAB — T4, FREE: Free T4: 1.36 ng/dL (ref 0.82–1.77)

## 2020-11-01 LAB — INSULIN, RANDOM: INSULIN: 26.9 u[IU]/mL — ABNORMAL HIGH (ref 2.6–24.9)

## 2020-11-01 LAB — LIPID PANEL WITH LDL/HDL RATIO
Cholesterol, Total: 227 mg/dL — ABNORMAL HIGH (ref 100–199)
HDL: 54 mg/dL (ref >39)
LDL Chol Calc (NIH): 160 mg/dL — ABNORMAL HIGH (ref 0–99)
LDL/HDL Ratio: 3 ratio (ref 0.0–3.6)
Triglycerides: 75 mg/dL (ref 0–149)
VLDL Cholesterol Cal: 13 mg/dL (ref 5–40)

## 2020-11-01 LAB — TSH: TSH: 0.891 u[IU]/mL (ref 0.450–4.500)

## 2020-11-01 LAB — HEMOGLOBIN A1C
Est. average glucose Bld gHb Est-mCnc: 126 mg/dL
Hgb A1c MFr Bld: 6 % — ABNORMAL HIGH (ref 4.8–5.6)

## 2020-11-01 LAB — VITAMIN B12: Vitamin B-12: 750 pg/mL (ref 232–1245)

## 2020-11-01 LAB — VITAMIN D 25 HYDROXY (VIT D DEFICIENCY, FRACTURES): Vit D, 25-Hydroxy: 36.9 ng/mL (ref 30.0–100.0)

## 2020-11-01 LAB — T3: T3, Total: 132 ng/dL (ref 71–180)

## 2020-11-04 NOTE — Progress Notes (Signed)
Dear Troy Felts, FNP,   Thank you for referring Troy Olson to our clinic. The following note includes my evaluation and treatment recommendations.  Chief Complaint:   OBESITY Troy Olson (MR# [id number]) is a 50 y.o. male who presents for evaluation and treatment of obesity and related comorbidities. Current BMI is Body mass index is 49.42 kg/m. Troy Olson has been struggling with his weight for many years and has been unsuccessful in either losing weight, maintaining weight loss, or reaching his healthy weight goal.  Troy Olson is currently in the action stage of change and ready to dedicate time achieving and maintaining a healthier weight. Troy Olson is interested in becoming our patient and working on intensive lifestyle modifications including (but not limited to) diet and exercise for weight loss.  Troy Olson says he was referred to Troy Olson by a coworker.  His daughter is vegetarian.  He says he has a "terrible sweet tooth".  He says that when he is behind the wheel he feels the need to chew on something.   Troy Olson's habits were reviewed today and are as follows: his desired weight loss is 115 pounds, he has been heavy most of his life, he started gaining weight around 2019, his heaviest weight ever was 338 pounds, he craves sweets, he snacks frequently in the evenings, he is frequently drinking liquids with calories, he frequently makes poor food choices, he frequently eats larger portions than normal and he struggles with emotional eating.  Depression Screen Troy Olson's Food and Mood (modified PHQ-9) score was 13.  Depression screen PHQ 2/9 10/31/2020  Decreased Interest 1  Down, Depressed, Hopeless 2  PHQ - 2 Score 3  Altered sleeping 3  Tired, decreased energy 3  Change in appetite 1  Feeling bad or failure about yourself  1  Trouble concentrating 2  Suicidal thoughts 0  PHQ-9 Score 13  Difficult doing work/chores Somewhat difficult   Assessment/Plan:   1. Other fatigue Troy Olson denies  daytime somnolence and denies waking up still tired. Patent has a history of symptoms of snoring. Troy Olson generally gets 5 or 6 hours of sleep per night, and states that he has generally restful sleep. Snoring is present. Apneic episodes are not present with CPAP. Epworth Sleepiness Score is 6.  [name] does feel that his weight is causing his energy to be lower than it should be. Fatigue may be related to obesity, depression or many other causes. Labs will be ordered, and in the meanwhile, Troy Olson will focus on self care including making healthy food choices, increasing physical activity and focusing on stress reduction.  - EKG 12-Lead - Vitamin B12 - CBC with Differential/Platelet - Comprehensive metabolic panel - Hemoglobin A1c - Insulin, random - Lipid Panel With LDL/HDL Ratio - T3 - T4, free - TSH - VITAMIN D 25 Hydroxy (Vit-D Deficiency, Fractures) - Testosterone,Free and Total  2. SOB (shortness of breath) on exertion Troy Olson notes increasing shortness of breath with exercising and seems to be worsening over time with weight gain. He notes getting out of breath sooner with activity than he used to. This has gotten worse recently. [name] denies shortness of breath at rest or orthopnea.  Troy Olson does feel that he gets out of breath more easily that he used to when he exercises. Troy Olson's shortness of breath appears to be obesity related and exercise induced. He has agreed to work on weight loss and gradually increase exercise to treat his exercise induced shortness of breath. Will continue to monitor closely.  -  Vitamin B12 - CBC with Differential/Platelet - Comprehensive metabolic panel - Hemoglobin A1c - Insulin, random - Lipid Panel With LDL/HDL Ratio - T3 - T4, free - TSH - VITAMIN D 25 Hydroxy (Vit-D Deficiency, Fractures) - Testosterone,Free and Total  3. Essential hypertension Not at goal. Medications: HCTZ 12.5 mg daily.   Plan: Avoid buying foods that are: processed,  frozen, or prepackaged to avoid excess salt. We will continue to monitor symptoms as they relate to his weight loss journey.  BP Readings from Last 3 Encounters:  10/31/20 (!) 148/84  09/30/20 (!) 160/102  08/01/20 (!) 144/88   Lab Results  Component Value Date   CREATININE 1.01 10/31/2020   4. Other hyperlipidemia Lipid-lowering medications: Pravachol 40 mg daily.   Plan: Dietary changes: Increase soluble fiber. Decrease simple carbohydrates. Exercise changes: An average 40 minutes of moderate to vigorous-intensity aerobic activity 3 or 4 times per week.   Lab Results  Component Value Date   CHOL 227 (H) 10/31/2020   HDL 54 10/31/2020   LDLCALC 160 (H) 10/31/2020   TRIG 75 10/31/2020   CHOLHDL 4.2 05/16/2020   Lab Results  Component Value Date   ALT 25 10/31/2020   AST 27 10/31/2020   ALKPHOS 66 10/31/2020   BILITOT 0.2 10/31/2020   The 10-year ASCVD risk score Troy Olson(Troy DC Jr., et al., 2013) is: 10%   Values used to calculate the score:     Age: [age] years     Sex: Male     Is Non-Hispanic African American: Yes     Diabetic: No     Tobacco smoker: No     Systolic Blood Pressure: 148 mmHg     Is BP treated: Yes     HDL Cholesterol: 54 mg/dL     Total Cholesterol: 227 mg/dL  5. OSA on CPAP OSA is a cause of systemic hypertension and is associated with an increased incidence of stroke, heart failure, atrial fibrillation, and coronary heart disease. Severe OSA increases all-cause mortality and  cardiovascular mortality.   Goal: Treatment of OSA via CPAP compliance and weight loss. . Plasma ghrelin levels (appetite or "hunger hormone") are significantly higher in OSA patients than in BMI-matched controls, but decrease to levels similar to those of obese patients without OSA after CPAP treatment.  . Weight loss improves OSA by several mechanisms, including reduction in fatty tissue in the throat (i.e. parapharyngeal fat) and the tongue. Loss of abdominal fat increases  mediastinal traction on the upper airway making it less likely to collapse during sleep. . Studies have also shown that compliance with CPAP treatment improves leptin (hunger inhibitory hormone) imbalance.  6. Asthma, unspecified asthma severity, unspecified whether complicated, unspecified whether persistent Troy PaceBryant is taking Singulair 10 mg nightly and Zyrtec 10 mg daily.  7. Prediabetes Not at goal. Goal is HgbA1c < 5.7.  Medication: metformin 500 mg twice daily.  He will continue to focus on protein-rich, low simple carbohydrate foods. We reviewed the importance of hydration, regular exercise for stress reduction, and restorative sleep.   Lab Results  Component Value Date   HGBA1C 6.0 (H) 10/31/2020   Lab Results  Component Value Date   INSULIN 26.9 (H) 10/31/2020   8. Poor sleep hygiene Epworth score is 6.  Troy PaceBryant endorses snoring.  He uses a CPAP machine.  9. Multiple food allergies Troy PaceBryant is allergic to nuts and is lactose intolerant.  10. Other depression, with emotional eating Troy PaceBryant is struggling with emotional eating and using food for comfort  to the extent that it is negatively impacting his health. He has been working on behavior modification techniques to help reduce his emotional eating and has been unsuccessful. He shows no sign of suicidal or homicidal ideations.  11. At risk for heart disease Due to Mendel's current state of health and medical condition(s), he is at a higher risk for heart disease.   This puts the patient at much greater risk to subsequently develop cardiopulmonary conditions that can significantly affect patient's quality of life in a negative manner as well.    At least 10 minutes was spent on counseling Callan about these concerns today. Initial goal is to lose at least 5-10% of starting weight to help reduce these risk factors.  We will continue to reassess these conditions on a fairly regular basis in an attempt to decrease patient's overall  morbidity and mortality.  Evidence-based interventions for health behavior change were utilized today including the discussion of self monitoring techniques, problem-solving barriers and SMART goal setting techniques.  Specifically regarding patient's less desirable eating habits and patterns, we employed the technique of small changes when Mattheu has not been able to fully commit to his prudent nutritional plan.  12. Class 3 severe obesity with serious comorbidity and body mass index (BMI) of 45.0 to 49.9 in adult, unspecified obesity type (HCC)  Troy Olson is currently in the action stage of change and his goal is to continue with weight loss efforts. I recommend Troy Olson begin the structured treatment plan as follows:  He has agreed to the Category 3 Plan.  Exercise goals: No exercise has been prescribed at this time.   Behavioral modification strategies: increasing lean protein intake, decreasing simple carbohydrates, increasing vegetables, increasing water intake, decreasing liquid calories, decreasing alcohol intake, decreasing sodium intake and increasing high fiber foods.  He was informed of the importance of frequent follow-up visits to maximize his success with intensive lifestyle modifications for his multiple health conditions. He was informed we would discuss his lab results at his next visit unless there is a critical issue that needs to be addressed sooner. Troy Olson agreed to keep his next visit at the agreed upon time to discuss these results.  Objective:   Blood pressure (!) 148/84, pulse 75, temperature 97.9 F (36.6 C), temperature source Oral, height 5\' 8"  (1.727 m), weight (!) 325 lb (147.4 kg), SpO2 100 %. Body mass index is 49.42 kg/m.  EKG: Normal sinus rhythm, rate 81 bpm.  Indirect Calorimeter completed today shows a VO2 of 308 and a REE of 2142.  His calculated basal metabolic rate is thus his basal metabolic rate is worse than expected.  General: Cooperative, alert,  well developed, in no acute distress. HEENT: Conjunctivae and lids unremarkable. Cardiovascular: Regular rhythm.  Lungs: Normal work of breathing. Neurologic: No focal deficits.   Lab Results  Component Value Date   CREATININE 1.01 10/31/2020   BUN 12 10/31/2020   NA 139 10/31/2020   K 4.1 10/31/2020   CL 101 10/31/2020   CO2 24 10/31/2020   Lab Results  Component Value Date   ALT 25 10/31/2020   AST 27 10/31/2020   ALKPHOS 66 10/31/2020   BILITOT 0.2 10/31/2020   Lab Results  Component Value Date   HGBA1C 6.0 (H) 10/31/2020   HGBA1C 6.0 (H) 08/01/2020   HGBA1C 5.8 (H) 09/13/2019   Lab Results  Component Value Date   INSULIN 26.9 (H) 10/31/2020   Lab Results  Component Value Date   TSH 0.891 10/31/2020  Lab Results  Component Value Date   CHOL 227 (H) 10/31/2020   HDL 54 10/31/2020   LDLCALC 160 (H) 10/31/2020   TRIG 75 10/31/2020   CHOLHDL 4.2 05/16/2020   Lab Results  Component Value Date   WBC 3.2 (L) 10/31/2020   HGB 14.1 10/31/2020   HCT 42.3 10/31/2020   MCV 84 10/31/2020   PLT 259 10/31/2020   Attestation Statements:   Reviewed by clinician on day of visit: allergies, medications, problem list, medical history, surgical history, family history, social history, and previous encounter notes.  This is the patient's first visit at [facility]. The patient's NEW PATIENT PACKET was reviewed at length. Included in the packet: current and past health history, medications, allergies, ROS, gynecologic history (women only), surgical history, family history, social history, weight history, weight loss surgery history (for those that have had weight loss surgery), nutritional evaluation, mood and food questionnaire, PHQ9, Epworth questionnaire, sleep habits questionnaire, patient life and health improvement goals questionnaire. These will all be scanned into the patient's chart under media.   During the visit, I independently reviewed the patient's  EKG, bioimpedance scale results, and indirect calorimeter results. I used this information to tailor a meal plan for the patient that will help him to lose weight and will improve his obesity-related conditions going forward. I performed a medically necessary appropriate examination and/or evaluation. I discussed the assessment and treatment plan with the patient. The patient was provided an opportunity to ask questions and all were answered. The patient agreed with the plan and demonstrated an understanding of the instructions. Labs were ordered at this visit and will be reviewed at the next visit unless more critical results need to be addressed immediately. Clinical information was updated and documented in the EMR.   I, Insurance claims handler, CMA, am acting as transcriptionist for Helane Rima, DO  I have reviewed the above documentation for accuracy and completeness, and I agree with the above. Helane Rima, DO

## 2020-11-05 ENCOUNTER — Other Ambulatory Visit: Payer: Self-pay

## 2020-11-05 ENCOUNTER — Encounter: Payer: Self-pay | Admitting: Nurse Practitioner

## 2020-11-05 ENCOUNTER — Ambulatory Visit: Payer: Managed Care, Other (non HMO) | Admitting: Nurse Practitioner

## 2020-11-05 VITALS — BP 132/80 | HR 75 | Temp 98.2°F | Ht 68.0 in | Wt 330.2 lb

## 2020-11-05 DIAGNOSIS — M545 Low back pain, unspecified: Secondary | ICD-10-CM | POA: Diagnosis not present

## 2020-11-05 DIAGNOSIS — Z6841 Body Mass Index (BMI) 40.0 and over, adult: Secondary | ICD-10-CM

## 2020-11-05 DIAGNOSIS — R7303 Prediabetes: Secondary | ICD-10-CM | POA: Diagnosis not present

## 2020-11-05 DIAGNOSIS — F3289 Other specified depressive episodes: Secondary | ICD-10-CM

## 2020-11-05 DIAGNOSIS — G8929 Other chronic pain: Secondary | ICD-10-CM

## 2020-11-05 DIAGNOSIS — I10 Essential (primary) hypertension: Secondary | ICD-10-CM | POA: Diagnosis not present

## 2020-11-05 MED ORDER — ALBUTEROL SULFATE HFA 108 (90 BASE) MCG/ACT IN AERS: 2.0000 | INHALATION_SPRAY | Freq: Four times a day (QID) | RESPIRATORY_TRACT | 5 refills | Status: DC | PRN

## 2020-11-05 MED ORDER — NAPROXEN 500 MG PO TABS: 500.0000 mg | ORAL_TABLET | Freq: Two times a day (BID) | ORAL | 0 refills | Status: DC

## 2020-11-05 MED ORDER — METFORMIN HCL 500 MG PO TABS
500.0000 mg | ORAL_TABLET | Freq: Two times a day (BID) | ORAL | 1 refills | Status: DC
Start: 2020-11-05 — End: 2020-11-05

## 2020-11-05 MED ORDER — METFORMIN HCL 500 MG PO TABS
500.0000 mg | ORAL_TABLET | Freq: Two times a day (BID) | ORAL | 1 refills | Status: DC
Start: 2020-11-05 — End: 2021-09-24

## 2020-11-05 NOTE — Progress Notes (Signed)
I,Yamilka Roman [employer] as a Neurosurgeon for [employer], FNP.,have documented all relevant documentation on the behalf of [name], FNP,as directed by  [name], FNP while in the presence of [name], FNP. This visit occurred during the SARS-CoV-2 public health emergency.  Safety protocols were in place, including screening questions prior to the visit, additional usage of staff PPE, and extensive cleaning of exam room while observing appropriate contact time as indicated for disinfecting solutions.  Subjective:     Patient ID: Troy Olson , male    DOB: Jul 22, 1971 , 50 y.o.   MRN: [id number]   Chief Complaint  Patient presents with  . Hypertension  . Obesity    Patient was recently started on metformin. He stated when first starting it it made his stomach.     HPI  Patient here for a f/u on his blood pressure and prediabetes with medication follow up. He has been talking more about his dad (passed away in 01/23/18) with his friends. He does have a friend who went to the wellness center who lost a significant amount of weight.   Wt Readings from Last 3 Encounters: 11/05/20 : (!) 330 lb 3.2 oz (149.8 kg) 10/31/20 : (!) 325 lb (147.4 kg) 09/30/20 : (!) 336 lb 2 oz (152.5 kg)  Hypertension The current episode started more than 1 year ago. The problem is controlled. Pertinent negatives include no anxiety, chest pain or palpitations. There are no associated agents to hypertension. Risk factors for coronary artery disease include obesity and sedentary lifestyle. Past treatments include diuretics. There are no compliance problems.  There is no history of angina or kidney disease. There is no history of chronic renal disease.     Past Medical History:  Diagnosis Date  . Allergies   . Asthma   . Back pain   . Edema of both lower extremities   . Food allergy   . HLD (hyperlipidemia)   . Hypertension   . Morbid obesity (HCC)   . Prediabetes   . Sleep apnea    CPAP      Family History  Problem Relation Age of Onset  . Hypertension Father   . Heart disease Father        stents placed  . Cancer Sister   . Ovarian cancer Sister        male cancer ? if ovaries  . Asthma Son      Current Outpatient Medications:  .  cetirizine (ZYRTEC) 10 MG tablet, Take 10 mg by mouth daily., Disp: , Rfl:  .  hydrochlorothiazide (HYDRODIURIL) 12.5 MG tablet, TAKE 1 TABLET BY MOUTH  DAILY, Disp: 90 tablet, Rfl: 3 .  montelukast (SINGULAIR) 10 MG tablet, TAKE 1 TABLET BY MOUTH  DAILY, Disp: 90 tablet, Rfl: 3 .  pravastatin (PRAVACHOL) 40 MG tablet, TAKE 1 TABLET BY MOUTH IN  THE EVENING, Disp: 90 tablet, Rfl: 3 .  albuterol (VENTOLIN HFA) 108 (90 Base) MCG/ACT inhaler, Inhale 2 puffs into the lungs every 6 (six) hours as needed. wheezing, Disp: 1 each, Rfl: 5 .  metFORMIN (GLUCOPHAGE) 500 MG tablet, Take 1 tablet (500 mg total) by mouth 2 (two) times daily with a meal., Disp: 180 tablet, Rfl: 1 .  naproxen (NAPROSYN) 500 MG tablet, Take 1 tablet (500 mg total) by mouth 2 (two) times daily with a meal., Disp: 30 tablet, Rfl: 0   Allergies  Allergen Reactions  . Food Allergy Formula Shortness Of Breath and Swelling    Any  nuts of any kind.  . Peanut-Containing Drug Products Shortness Of Breath, Diarrhea, Nausea And Vomiting and Swelling     Review of Systems  Constitutional: Negative.   Respiratory: Negative.   Cardiovascular: Negative.  Negative for chest pain, palpitations and leg swelling.  Neurological: Negative.   Psychiatric/Behavioral: Negative.      Today's Vitals   11/05/20 0939  BP: 132/80  Pulse: 75  Temp: 98.2 F (36.8 C)  TempSrc: Oral  Weight: (!) 330 lb 3.2 oz (149.8 kg)  Height: 5\' 8"  (1.727 m)  PainSc: 0-No pain   Body mass index is 50.21 kg/m.   Objective:  Physical Exam Constitutional:      General: He is not in acute distress.    Appearance: Normal appearance. He is obese.  Cardiovascular:     Rate and Rhythm: Normal rate and  regular rhythm.     Pulses: Normal pulses.     Heart sounds: Normal heart sounds. No murmur heard.   Pulmonary:     Effort: Pulmonary effort is normal. No respiratory distress.     Breath sounds: Normal breath sounds. No wheezing.  Neurological:     General: No focal deficit present.     Mental Status: He is alert and oriented to person, place, and time.     Cranial Nerves: No cranial nerve deficit.  Psychiatric:        Mood and Affect: Mood normal.        Behavior: Behavior normal.        Thought Content: Thought content normal.        Judgment: Judgment normal.         Assessment And Plan:     1. Essential hypertension  Chronic, stable  Continue with current medications  2. Prediabetes  Chronic, stable did not change since last visit  He will be sure to take his metformin twice a day and if still elevated at next visit will consider adding Rybelsus or Ozempic  3. BMI 45.0-49.9, adult [facility])  He is going to [facility] and focusing on his diet  4. Chronic midline low back pain without sciatica  This has been ongoing slight improvement after PT if continues to not improve will need to consider referral to orthopedics - naproxen (NAPROSYN) 500 MG tablet; Take 1 tablet (500 mg total) by mouth 2 (two) times daily with a meal.  Dispense: 30 tablet; Refill: 0  5. Other depression   he feels this is situational since his father passed 2 years ago  Offered counseling however he declined at this time and reports he speaks with his friends about his challenges    Patient was given opportunity to ask questions. Patient verbalized understanding of the plan and was able to repeat key elements of the plan. All questions were answered to their satisfaction.  [facility], FNP    I, [name], FNP, have reviewed all documentation for this visit. The documentation on 11/05/20 for the exam, diagnosis, procedures, and orders are all accurate and complete.    THE PATIENT IS ENCOURAGED TO PRACTICE SOCIAL DISTANCING DUE TO THE COVID-19 PANDEMIC.

## 2020-11-05 NOTE — Patient Instructions (Signed)
   Consider taking Ozempic which is an injection weekly or Rybelsus once a day which is a pill to help with your prediabetes and will also help with weight. We will see how you are doing in 3 months.

## 2020-11-14 ENCOUNTER — Ambulatory Visit (INDEPENDENT_AMBULATORY_CARE_PROVIDER_SITE_OTHER): Payer: Managed Care, Other (non HMO) | Admitting: Family Medicine

## 2020-11-14 ENCOUNTER — Encounter (INDEPENDENT_AMBULATORY_CARE_PROVIDER_SITE_OTHER): Payer: Self-pay | Admitting: Family Medicine

## 2020-11-14 ENCOUNTER — Other Ambulatory Visit: Payer: Self-pay

## 2020-11-14 VITALS — BP 163/92 | HR 77 | Temp 97.9°F | Ht 68.0 in | Wt 317.0 lb

## 2020-11-14 DIAGNOSIS — R7303 Prediabetes: Secondary | ICD-10-CM

## 2020-11-14 DIAGNOSIS — I10 Essential (primary) hypertension: Secondary | ICD-10-CM

## 2020-11-14 DIAGNOSIS — Z9189 Other specified personal risk factors, not elsewhere classified: Secondary | ICD-10-CM

## 2020-11-14 DIAGNOSIS — Z6841 Body Mass Index (BMI) 40.0 and over, adult: Secondary | ICD-10-CM

## 2020-11-14 DIAGNOSIS — D72818 Other decreased white blood cell count: Secondary | ICD-10-CM

## 2020-11-14 DIAGNOSIS — E782 Mixed hyperlipidemia: Secondary | ICD-10-CM

## 2020-11-14 MED ORDER — BLOOD PRESSURE MONITOR/L CUFF MISC: 0 refills | Status: AC

## 2020-11-14 NOTE — Progress Notes (Signed)
Chief Complaint:   OBESITY Troy Olson is here to discuss his progress with his obesity treatment plan along with follow-up of his obesity related diagnoses.   Today's visit was #: 2 Starting weight: 325 lbs Starting date: 10/31/2020 Today's weight: 317 lbs Today's date: 11/14/2020 Total lbs lost to date: 8 lbs Body mass index is 48.2 kg/m.  Total weight loss percentage to date: -2.46%  Interim History: Troy Olson says he had a follow-up with his PCP and is now taking metformin twice daily consistently.  Denies polyphagia but says he is doing some boredom eating at night.    Troy Olson provided the following food recall today: Breakfast:  English muffin, avocado, boiled egg, onion, tomato. Lunch:  Sandwich or [employer] power bowl. Dinner:  Cauliflower rice with fish.  Nutrition Plan: the Category 3 Plan for 75-80% of the time.  Activity: None at this time.  Assessment/Plan:   1. Prediabetes Not at goal. Goal is HgbA1c < 5.7.  Medication: metformin 500 mg twice daily.   Plan:  Continue metformin twice daily.  He will continue to focus on protein-rich, low simple carbohydrate foods. We reviewed the importance of hydration, regular exercise for stress reduction, and restorative sleep.   Lab Results  Component Value Date   HGBA1C 6.0 (H) 10/31/2020   Lab Results  Component Value Date   INSULIN 26.9 (H) 10/31/2020   2. Other decreased white blood cell (WBC) count WBC count was 3.2 on 10/31/2020.  Plan:  Recheck at future visit.  Lab Results  Component Value Date   WBC 3.2 (L) 10/31/2020   3. Mixed hyperlipidemia Course: Not at goal.. Lipid-lowering medications: None.   Plan: Dietary changes: Increase soluble fiber, decrease simple carbohydrates, decrease saturated fat. Exercise changes: Moderate to vigorous-intensity aerobic activity 150 minutes per week or as tolerated. We will continue to monitor along with PCP/specialists as it pertains to his weight loss journey.  Lab Results   Component Value Date   CHOL 227 (H) 10/31/2020   HDL 54 10/31/2020   LDLCALC 160 (H) 10/31/2020   TRIG 75 10/31/2020   CHOLHDL 4.2 05/16/2020   Lab Results  Component Value Date   ALT 25 10/31/2020   AST 27 10/31/2020   ALKPHOS 66 10/31/2020   BILITOT 0.2 10/31/2020   The 10-year ASCVD risk score Troy Olson DC Jr., et al., 2013) is: 11.9%   Values used to calculate the score:     Age: 50 years     Sex: Male     Is Non-Hispanic African American: Yes     Diabetic: No     Tobacco smoker: No     Systolic Blood Pressure: 163 mmHg     Is BP treated: Yes     HDL Cholesterol: 54 mg/dL     Total Cholesterol: 227 mg/dL  4. Essential hypertension Not at goal. Medications: HCTZ 12.5 mg daily.   Plan: Avoid buying foods that are: processed, frozen, or prepackaged to avoid excess salt. Ambulatory blood pressure monitoring was encouraged with a goal of at least 2-3 times weekly or when feeling poorly.  He was instructed to keep a log for [place] to review at each office visit.  Blood pressure monitor and supplies sent to his pharmacy today, as per below.  - Blood Pressure Monitoring (BLOOD PRESSURE MONITOR/L CUFF) MISC; Use to check blood pressure  Dispense: 1 each; Refill: 0  BP Readings from Last 3 Encounters:  11/14/20 (!) 163/92  11/05/20 132/80  10/31/20 (!) 148/84  Lab Results  Component Value Date   CREATININE 1.01 10/31/2020   5. At risk for heart disease Due to Romney's current state of health and medical condition(s), he is at a higher risk for heart disease.  This puts the patient at much greater risk to subsequently develop cardiopulmonary conditions that can significantly affect patient's quality of life in a negative manner.    At least 10 minutes were spent on counseling Arihant about these concerns today, and I stressed the importance of reversing risks factors of obesity, especially truncal and visceral fat, hypertension, hyperlipidemia, and pre-diabetes.  The initial goal is  to lose at least 5-10% of starting weight to help reduce these risk factors.  Counseling:  Intensive lifestyle modifications were discussed with Troy Olson as the most appropriate first line of treatment.  he will continue to work on diet, exercise, and weight loss efforts.  We will continue to reassess these conditions on a fairly regular basis in an attempt to decrease the patient's overall morbidity and mortality.  Evidence-based interventions for health behavior change were utilized today including the discussion of self monitoring techniques, problem-solving barriers, and SMART goal setting techniques.  Specifically, regarding patient's less desirable eating habits and patterns, we employed the technique of small changes when Troy Olson has not been able to fully commit to his prudent nutritional plan.  6. Class 3 severe obesity with serious comorbidity and body mass index (BMI) of 45.0 to 49.9 in adult, unspecified obesity type (HCC)  Course: Troy Olson is currently in the action stage of change. As such, his goal is to continue with weight loss efforts.   Nutrition goals: He has agreed to the Category 3 Plan.   Exercise goals: [employer] - goal is to get a membership.  Behavioral modification strategies: increasing lean protein intake, decreasing simple carbohydrates, increasing vegetables, increasing water intake and decreasing liquid calories.  Troy Olson has agreed to follow-up with our clinic in 2 weeks (every 2 weeks with Alois Cliche, PA-C/Katy Maryfrances Bunnell, NP) then Dr. Earlene Plater. He was informed of the importance of frequent follow-up visits to maximize his success with intensive lifestyle modifications for his multiple health conditions.   Objective:   Blood pressure (!) 163/92, pulse 77, temperature 97.9 F (36.6 C), temperature source Oral, height 5\' 8"  (1.727 m), weight (!) 317 lb (143.8 kg), SpO2 97 %. Body mass index is 48.2 kg/m.  General: Cooperative, alert, well developed, in no acute  distress. HEENT: Conjunctivae and lids unremarkable. Cardiovascular: Regular rhythm.  Lungs: Normal work of breathing. Neurologic: No focal deficits.   Lab Results  Component Value Date   CREATININE 1.01 10/31/2020   BUN 12 10/31/2020   NA 139 10/31/2020   K 4.1 10/31/2020   CL 101 10/31/2020   CO2 24 10/31/2020   Lab Results  Component Value Date   ALT 25 10/31/2020   AST 27 10/31/2020   ALKPHOS 66 10/31/2020   BILITOT 0.2 10/31/2020   Lab Results  Component Value Date   HGBA1C 6.0 (H) 10/31/2020   HGBA1C 6.0 (H) 08/01/2020   HGBA1C 5.8 (H) 09/13/2019   Lab Results  Component Value Date   INSULIN 26.9 (H) 10/31/2020   Lab Results  Component Value Date   TSH 0.891 10/31/2020   Lab Results  Component Value Date   CHOL 227 (H) 10/31/2020   HDL 54 10/31/2020   LDLCALC 160 (H) 10/31/2020   TRIG 75 10/31/2020   CHOLHDL 4.2 05/16/2020   Lab Results  Component Value Date  WBC 3.2 (L) 10/31/2020   HGB 14.1 10/31/2020   HCT 42.3 10/31/2020   MCV 84 10/31/2020   PLT 259 10/31/2020   Attestation Statements:   Reviewed by clinician on day of visit: allergies, medications, problem list, medical history, surgical history, family history, social history, and previous encounter notes.  I, Insurance claims handler, CMA, am acting as transcriptionist for Helane Rima, DO  I have reviewed the above documentation for accuracy and completeness, and I agree with the above. Helane Rima, DO

## 2020-12-02 ENCOUNTER — Encounter (INDEPENDENT_AMBULATORY_CARE_PROVIDER_SITE_OTHER): Payer: Self-pay

## 2020-12-02 ENCOUNTER — Ambulatory Visit (INDEPENDENT_AMBULATORY_CARE_PROVIDER_SITE_OTHER): Payer: Managed Care, Other (non HMO) | Admitting: Physician Assistant

## 2020-12-02 ENCOUNTER — Other Ambulatory Visit: Payer: Self-pay

## 2020-12-05 ENCOUNTER — Encounter: Payer: Self-pay | Admitting: Internal Medicine

## 2020-12-05 ENCOUNTER — Ambulatory Visit: Payer: Managed Care, Other (non HMO) | Admitting: Internal Medicine

## 2020-12-05 VITALS — BP 149/82 | HR 85 | Ht 68.0 in | Wt 318.0 lb

## 2020-12-05 DIAGNOSIS — Z1211 Encounter for screening for malignant neoplasm of colon: Secondary | ICD-10-CM

## 2020-12-05 DIAGNOSIS — Z6841 Body Mass Index (BMI) 40.0 and over, adult: Secondary | ICD-10-CM | POA: Diagnosis not present

## 2020-12-05 NOTE — Patient Instructions (Signed)
Normal BMI (Body Mass Index- based on height and weight) is between 19 and 25. Your BMI today is Body mass index is 48.35 kg/m. Marland Kitchen Please consider follow up  regarding your BMI with your Primary Care Provider.  You have been scheduled for a colonoscopy. Please follow written instructions given to you at your visit today.  Please pick up your prep supplies at the pharmacy within the next 1-3 days. If you use inhalers (even only as needed), please bring them with you on the day of your procedure.  Due to recent changes in healthcare laws, you may see the results of your imaging and laboratory studies on MyChart before your provider has had a chance to review them.  We understand that in some cases there may be results that are confusing or concerning to you. Not all laboratory results come back in the same time frame and the provider may be waiting for multiple results in order to interpret others.  Please give [place] 48 hours in order for your provider to thoroughly review all the results before contacting the office for clarification of your results.   I appreciate the opportunity to care for you. Stan Head, MD, [facility]

## 2020-12-05 NOTE — Progress Notes (Signed)
Troy Olson 49 y.o. [birth year] [id number]  Assessment & Plan:   Encounter Diagnoses  Name Primary?  . Colon cancer screening Yes  . Class 3 severe obesity due to excess calories with body mass index (BMI) of 45.0 to 49.9 in adult, unspecified whether serious comorbidity present [facility])    The patient now meets criteria to have his colonoscopy for screening in the [place] [facility].  His BMI is now below 50 and he is congratulated on his weight loss so far and encouraged to continue.  I have provided some handouts about macronutrients that are desirable, the effects of insulin and obesity and repeat suggestions about diet doctor, Dr. Harley Hallmark and Dr. Danny Lawless.  The risks and benefits as well as alternatives of endoscopic procedure(s) have been discussed and reviewed. All questions answered. The patient agrees to proceed.  I appreciate the opportunity to care for this patient. CC: Dorothyann Peng, MD    Subjective:   Chief Complaint: Colon cancer screening and follow-up of obesity  HPI Troy Olson was seen in December he had a BMI greater than 50 and we were discussing a screening colonoscopy.  At that time I recommended he try to lose weight to get his BMI below 50 so he could have his screening procedure in the ambulatory setting.  He has been successful losing 12 pounds.  He has changed how he eats with reducing calories and is walking on a treadmill 30 minutes every day. Wt Readings from Last 3 Encounters:  12/05/20 (!) 318 lb (144.2 kg)  11/14/20 (!) 317 lb (143.8 kg)  11/05/20 (!) 330 lb 3.2 oz (149.8 kg)   He had had some loose stools with Metformin treatment for prediabetes.  No other GI symptoms.  Allergies  Allergen Reactions  . Food Allergy Formula Shortness Of Breath and Swelling    Any nuts of any kind.  . Peanut-Containing Drug Products Shortness Of Breath, Diarrhea, Nausea And Vomiting and Swelling   Current Meds  Medication Sig  . albuterol (VENTOLIN HFA) 108  (90 Base) MCG/ACT inhaler Inhale 2 puffs into the lungs every 6 (six) hours as needed. wheezing  . Blood Pressure Monitoring (BLOOD PRESSURE MONITOR/L CUFF) MISC Use to check blood pressure  . cetirizine (ZYRTEC) 10 MG tablet Take 10 mg by mouth daily.  . hydrochlorothiazide (HYDRODIURIL) 12.5 MG tablet TAKE 1 TABLET BY MOUTH  DAILY  . metFORMIN (GLUCOPHAGE) 500 MG tablet Take 1 tablet (500 mg total) by mouth 2 (two) times daily with a meal.  . montelukast (SINGULAIR) 10 MG tablet TAKE 1 TABLET BY MOUTH  DAILY  . naproxen (NAPROSYN) 500 MG tablet Take 1 tablet (500 mg total) by mouth 2 (two) times daily with a meal.  . pravastatin (PRAVACHOL) 40 MG tablet TAKE 1 TABLET BY MOUTH IN  THE EVENING   Past Medical History:  Diagnosis Date  . Allergies   . Asthma   . Back pain   . Edema of both lower extremities   . Food allergy   . HLD (hyperlipidemia)   . Hypertension   . Morbid obesity (HCC)   . Prediabetes   . Sleep apnea    CPAP   Past Surgical History:  Procedure Laterality Date  . MOUTH SURGERY     widsom teeth   Social History   Social History Narrative   Patient is married with 2 sons and 1 daughter   He owns a dump truck   Former smoker   3 alcoholic beverages daily  1 caffeinated beverage daily   No drug use or other tobacco   family history includes Asthma in his son; Cancer in his sister; Heart disease in his father; Hypertension in his father; Ovarian cancer in his sister.   Review of Systems As per HPI  Objective:   Physical Exam BP (!) 149/82   Pulse 85   Ht 5\' 8"  (1.727 m)   Wt (!) 318 lb (144.2 kg)   SpO2 97%   BMI 48.35 kg/m

## 2020-12-11 ENCOUNTER — Encounter: Payer: Self-pay | Admitting: Nurse Practitioner

## 2020-12-12 ENCOUNTER — Encounter: Payer: Self-pay | Admitting: Nurse Practitioner

## 2020-12-12 ENCOUNTER — Ambulatory Visit: Payer: Managed Care, Other (non HMO) | Admitting: Nurse Practitioner

## 2020-12-12 ENCOUNTER — Other Ambulatory Visit: Payer: Self-pay

## 2020-12-12 VITALS — BP 128/92 | HR 81 | Temp 98.3°F | Ht 68.0 in | Wt 317.0 lb

## 2020-12-12 DIAGNOSIS — R7303 Prediabetes: Secondary | ICD-10-CM | POA: Diagnosis not present

## 2020-12-12 DIAGNOSIS — Z6841 Body Mass Index (BMI) 40.0 and over, adult: Secondary | ICD-10-CM | POA: Diagnosis not present

## 2020-12-12 DIAGNOSIS — R1900 Intra-abdominal and pelvic swelling, mass and lump, unspecified site: Secondary | ICD-10-CM

## 2020-12-12 NOTE — Progress Notes (Signed)
Tomasa Hose as a scribe for [name], FNP.,have documented all relevant documentation on the behalf of [name], FNP,as directed by  [name], FNP while in the presence of [name], FNP. This visit occurred during the SARS-CoV-2 public health emergency.  Safety protocols were in place, including screening questions prior to the visit, additional usage of staff PPE, and extensive cleaning of exam room while observing appropriate contact time as indicated for disinfecting solutions.  Subjective:     Patient ID: Troy Olson , male    DOB: 1971/05/28 , 50 y.o.   MRN: [id number]   Chief Complaint  Patient presents with   abdomen bulge    HPI  Pt is here today for a knot in his stomach area. Yesterday he had tightness in his abdomen which kept him from working. He also mentions having diarrhea on Monday, nausea and vomiting on Monday.  He has been exercising more.  Denies urinary problems.   Wt Readings from Last 3 Encounters: 12/12/20 : (!) 317 lb (143.8 kg) 12/05/20 : (!) 318 lb (144.2 kg)  11/14/20 : (!) 317 lb (143.8 kg)       Past Medical History:  Diagnosis Date   Allergies    Asthma    Back pain    Edema of both lower extremities    Food allergy    HLD (hyperlipidemia)    Hypertension    Morbid obesity (HCC)    Prediabetes    Sleep apnea    CPAP     Family History  Problem Relation Age of Onset   Hypertension Father    Heart disease Father        stents placed   Cancer Sister    Ovarian cancer Sister        male cancer ? if ovaries   Asthma Son      Current Outpatient Medications:    albuterol (VENTOLIN HFA) 108 (90 Base) MCG/ACT inhaler, Inhale 2 puffs into the lungs every 6 (six) hours as needed. wheezing, Disp: 1 each, Rfl: 5   cetirizine (ZYRTEC) 10 MG tablet, Take 10 mg by mouth daily., Disp: , Rfl:    hydrochlorothiazide (HYDRODIURIL) 12.5 MG tablet, TAKE 1 TABLET BY MOUTH  DAILY, Disp: 90 tablet, Rfl: 3   metFORMIN  (GLUCOPHAGE) 500 MG tablet, Take 1 tablet (500 mg total) by mouth 2 (two) times daily with a meal., Disp: 180 tablet, Rfl: 1   montelukast (SINGULAIR) 10 MG tablet, TAKE 1 TABLET BY MOUTH  DAILY, Disp: 90 tablet, Rfl: 3   naproxen (NAPROSYN) 500 MG tablet, Take 1 tablet (500 mg total) by mouth 2 (two) times daily with a meal., Disp: 30 tablet, Rfl: 0   pravastatin (PRAVACHOL) 40 MG tablet, TAKE 1 TABLET BY MOUTH IN  THE EVENING, Disp: 90 tablet, Rfl: 3   Blood Pressure Monitoring (BLOOD PRESSURE MONITOR/L CUFF) MISC, Use to check blood pressure (Patient not taking: Reported on 12/12/2020), Disp: 1 each, Rfl: 0   Allergies  Allergen Reactions   Food Allergy Formula Shortness Of Breath and Swelling    Any nuts of any kind.   Peanut-Containing Drug Products Shortness Of Breath, Diarrhea, Nausea And Vomiting and Swelling     Review of Systems  Constitutional: Negative.  Negative for fatigue.  HENT: Negative.   Respiratory: Negative.   Cardiovascular: Negative.  Negative for chest pain, palpitations and leg swelling.  Gastrointestinal: Positive for abdominal distention and abdominal pain (sharp when touching). Negative for constipation, diarrhea, nausea  and vomiting.  Endocrine: Negative for polydipsia, polyphagia and polyuria.  Musculoskeletal: Negative.   Skin: Negative.   Neurological: Negative for dizziness and headaches.  Psychiatric/Behavioral: Negative.      Today's Vitals   12/12/20 1050  BP: (!) 128/92  Pulse: 81  Temp: 98.3 F (36.8 C)  TempSrc: Oral  SpO2: 99%  Weight: (!) 317 lb (143.8 kg)  Height: 5\' 8"  (1.727 m)   Body mass index is 48.2 kg/m.  Wt Readings from Last 3 Encounters:  12/12/20 (!) 317 lb (143.8 kg)  12/05/20 (!) 318 lb (144.2 kg)  11/14/20 (!) 317 lb (143.8 kg)   Objective:  Physical Exam Vitals reviewed.  Constitutional:      General: He is not in acute distress.    Appearance: Normal appearance. He is obese.  Eyes:     Extraocular Movements:  Extraocular movements intact.     Conjunctiva/sclera: Conjunctivae normal.     Pupils: Pupils are equal, round, and reactive to light.  Cardiovascular:     Rate and Rhythm: Normal rate and regular rhythm.     Pulses: Normal pulses.     Heart sounds: Normal heart sounds. No murmur heard.   Pulmonary:     Effort: Pulmonary effort is normal. No respiratory distress.     Breath sounds: Normal breath sounds. No wheezing.  Skin:    General: Skin is warm and dry.     Capillary Refill: Capillary refill takes less than 2 seconds.  Neurological:     General: No focal deficit present.     Mental Status: He is alert and oriented to person, place, and time.     Cranial Nerves: No cranial nerve deficit.  Psychiatric:        Mood and Affect: Mood normal.        Behavior: Behavior normal.        Thought Content: Thought content normal.        Judgment: Judgment normal.         Assessment And Plan:     1. Abdominal wall bulge Will send for ultrasound to check for hernia vs mass Nonpalpable bulge to mid abdomen - 01/12/21 Abdomen Complete; Future  2. Prediabetes He may be interested in starting Ozempic we will wait until after he has his ultrasound to make sure he is not having any other problems.   3. BMI 45.0-49.9, adult (HCC) Encouraged to continue at healthy weight and wellness He is currently at a "stand still with his weight".  Encouraged to stay consistent with exercise and healthy eating.  He is encouraged to initially strive for BMI less than 30 to decrease cardiac risk. He is advised to exercise no less than 150 minutes per week.   Patient was given opportunity to ask questions. Patient verbalized understanding of the plan and was able to repeat key elements of the plan. All questions were answered to their satisfaction.  03-06-1984, FNP   I, [name], FNP, have reviewed all documentation for this visit. The documentation on 12/12/20 for the exam, diagnosis, procedures, and  orders are all accurate and complete.  THE PATIENT IS ENCOURAGED TO PRACTICE SOCIAL DISTANCING DUE TO THE COVID-19 PANDEMIC.

## 2020-12-18 ENCOUNTER — Encounter (INDEPENDENT_AMBULATORY_CARE_PROVIDER_SITE_OTHER): Payer: Self-pay

## 2020-12-18 ENCOUNTER — Ambulatory Visit (INDEPENDENT_AMBULATORY_CARE_PROVIDER_SITE_OTHER): Payer: Managed Care, Other (non HMO) | Admitting: Family Medicine

## 2020-12-31 ENCOUNTER — Ambulatory Visit
Admission: RE | Admit: 2020-12-31 | Discharge: 2020-12-31 | Disposition: A | Discharge status: Home / Self Care | Payer: Managed Care, Other (non HMO) | Source: Ambulatory Visit | Attending: Nurse Practitioner | Admitting: Nurse Practitioner

## 2020-12-31 DIAGNOSIS — R1900 Intra-abdominal and pelvic swelling, mass and lump, unspecified site: Secondary | ICD-10-CM

## 2021-01-02 ENCOUNTER — Other Ambulatory Visit: Payer: Self-pay

## 2021-01-02 DIAGNOSIS — K439 Ventral hernia without obstruction or gangrene: Secondary | ICD-10-CM

## 2021-01-13 ENCOUNTER — Ambulatory Visit: Payer: Managed Care, Other (non HMO) | Admitting: Dermatology

## 2021-01-13 ENCOUNTER — Encounter: Payer: Self-pay | Admitting: Dermatology

## 2021-01-13 ENCOUNTER — Other Ambulatory Visit: Payer: Self-pay

## 2021-01-13 DIAGNOSIS — L810 Postinflammatory hyperpigmentation: Secondary | ICD-10-CM

## 2021-01-13 DIAGNOSIS — L80 Vitiligo: Secondary | ICD-10-CM | POA: Diagnosis not present

## 2021-01-13 DIAGNOSIS — L309 Dermatitis, unspecified: Secondary | ICD-10-CM | POA: Diagnosis not present

## 2021-01-13 DIAGNOSIS — Z1283 Encounter for screening for malignant neoplasm of skin: Secondary | ICD-10-CM

## 2021-01-13 MED ORDER — HALOBETASOL PROPIONATE 0.05 % EX CREA: TOPICAL_CREAM | CUTANEOUS | 1 refills | Status: DC

## 2021-01-13 NOTE — Patient Instructions (Addendum)
Use Halobetasol cream after bathing.

## 2021-01-14 ENCOUNTER — Ambulatory Visit: Payer: Managed Care, Other (non HMO) | Admitting: Internal Medicine

## 2021-01-15 ENCOUNTER — Other Ambulatory Visit: Payer: Self-pay

## 2021-01-15 MED ORDER — SYMBICORT 80-4.5 MCG/ACT IN AERO: 2.0000 | INHALATION_SPRAY | Freq: Every day | RESPIRATORY_TRACT | 0 refills | Status: DC

## 2021-01-16 ENCOUNTER — Other Ambulatory Visit: Payer: Self-pay | Admitting: Nurse Practitioner

## 2021-01-25 ENCOUNTER — Encounter: Payer: Self-pay | Admitting: Dermatology

## 2021-01-25 NOTE — Progress Notes (Signed)
   New Patient   Subjective  Troy Olson is a 50 y.o. male who presents for the following: Annual Exam (Full body skin exam. Per patient abdomen has discoloration x years, PCP advised to come get check.).  Breaking out over many years, and areas prone to heal with dark spots. Location:  Duration:  Quality:  Associated Signs/Symptoms: Modifying Factors: Recently mainly nonprescription lotions. Severity:  Timing: Context:    The following portions of the chart were reviewed this encounter and updated as appropriate:  Tobacco  Allergies  Meds  Problems  Med Hx  Surg Hx  Fam Hx      Objective  Well appearing patient in no apparent distress; mood and affect are within normal limits. Objective  Left Ear, Left Lower Leg - Anterior, Right Ear: Spread to patchy chronic dermatitis; with involvement of the retroauricular crease, this is most likely an adult variant of atopic dermatitis.  Moderate postinflammatory hyperpigmentation.  Images          Objective  waist up skin check: No atypical moles, no skin cancer.    A focused examination was performed including Head, neck, back, arms, legs, abdomen.. Relevant physical exam findings are noted in the Assessment and Plan.   Assessment & Plan  Eczema, unspecified type (3) Left Ear; Right Ear; Left Lower Leg - Anterior  Halobetasol applied daily for 2 to 4 weeks; once there is improvement, he will taper the halobetasol to every other night alternating with CeraVe cream.  He is not to use the prescription cream on the face or body folds.  Initial follow-up by MyChart or telephone in 4 weeks.  Ordered Medications: halobetasol (ULTRAVATE) 0.05 % cream  Screening exam for skin cancer waist up skin check  Yearly skin check.  Postinflammatory hyperpigmentation Right Abdomen (side) - Lower  Vitiligo Right Abdomen (side) - Upper  Ordered Medications: halobetasol (ULTRAVATE) 0.05 % cream

## 2021-01-27 ENCOUNTER — Other Ambulatory Visit: Payer: Self-pay

## 2021-01-27 ENCOUNTER — Encounter: Payer: Self-pay | Admitting: Internal Medicine

## 2021-01-27 ENCOUNTER — Ambulatory Visit (AMBULATORY_SURGERY_CENTER): Payer: Managed Care, Other (non HMO) | Admitting: Internal Medicine

## 2021-01-27 VITALS — BP 142/93 | HR 63 | Temp 98.9°F | Resp 12 | Ht 68.0 in | Wt 317.0 lb

## 2021-01-27 DIAGNOSIS — Z1211 Encounter for screening for malignant neoplasm of colon: Secondary | ICD-10-CM

## 2021-01-27 HISTORY — PX: COLONOSCOPY: SHX174

## 2021-01-27 MED ORDER — SODIUM CHLORIDE 0.9 % IV SOLN: 500.0000 mL | Freq: Once | INTRAVENOUS | Status: DC

## 2021-01-27 NOTE — Op Note (Signed)
[facility] Patient Name: Troy Olson Procedure Date: 01/27/2021 2:01 PM MRN: [id number] Endoscopist: Iva Boop , MD Age: 50 Referring MD:  Date of Birth: 19-May-1971 Gender: Male Account #: [id number] Procedure:                Colonoscopy Indications:              Screening for colorectal malignant neoplasm, This                            is the patient's first colonoscopy Medicines:                Propofol per Anesthesia, Monitored Anesthesia Care Procedure:                Pre-Anesthesia Assessment:                           - Prior to the procedure, a History and Physical                            was performed, and patient medications and                            allergies were reviewed. The patient's tolerance of                            previous anesthesia was also reviewed. The risks                            and benefits of the procedure and the sedation                            options and risks were discussed with the patient.                            All questions were answered, and informed consent                            was obtained. Prior Anticoagulants: The patient has                            taken no previous anticoagulant or antiplatelet                            agents. ASA Grade Assessment: III - A patient with                            severe systemic disease. After reviewing the risks                            and benefits, the patient was deemed in                            satisfactory condition to undergo the procedure.  After obtaining informed consent, the colonoscope                            was passed under direct vision. Throughout the                            procedure, the patient's blood pressure, pulse, and                            oxygen saturations were monitored continuously. The                            Olympus CF-HQ190L (Serial# [id number]) Colonoscope was                             introduced through the anus and advanced to the the                            cecum, identified by appendiceal orifice and                            ileocecal valve. The colonoscopy was performed                            without difficulty. The patient tolerated the                            procedure well. The quality of the bowel                            preparation was good. The ileocecal valve,                            appendiceal orifice, and rectum were photographed.                            The bowel preparation used was Miralax via split                            dose instruction. Scope In: 2:06:31 PM Scope Out: 2:16:34 PM Scope Withdrawal Time: 0 hours 8 minutes 17 seconds  Total Procedure Duration: 0 hours 10 minutes 3 seconds  Findings:                 The perianal and digital rectal examinations were                            normal. Pertinent negatives include normal prostate                            (size, shape, and consistency).                           The entire examined colon appeared normal on direct  and retroflexion views. Complications:            No immediate complications. Estimated Blood Loss:     Estimated blood loss: none. Impression:               - The entire examined colon is normal on direct and                            retroflexion views.                           - No specimens collected. Recommendation:           - Patient has a contact number available for                            emergencies. The signs and symptoms of potential                            delayed complications were discussed with the                            patient. Return to normal activities tomorrow.                            Written discharge instructions were provided to the                            patient.                           - Resume previous diet.                           - Continue present medications.                            - Repeat colonoscopy in 10 years for screening                            purposes. Iva Boop, MD 01/27/2021 2:24:38 PM This report has been signed electronically.

## 2021-01-27 NOTE — Progress Notes (Signed)
Report given to PACU, vss 

## 2021-01-27 NOTE — Patient Instructions (Addendum)
No polyps or cancer seen.  The colonoscopy was normal.  Next routine colonoscopy or other screening test in 10 years - 2032.   Please keep working on changing how you eat and keep walking! Losing weight is the single most important thing you can do for your health.  I appreciate the opportunity to care for you. Iva Boop, MD, FACG   YOU HAD AN ENDOSCOPIC PROCEDURE TODAY AT THE [facility]:   Refer to the procedure report that was given to you for any specific questions about what was found during the examination.  If the procedure report does not answer your questions, please call your gastroenterologist to clarify.  If you requested that your care partner not be given the details of your procedure findings, then the procedure report has been included in a sealed envelope for you to review at your convenience later.  YOU SHOULD EXPECT: Some feelings of bloating in the abdomen. Passage of more gas than usual.  Walking can help get rid of the air that was put into your GI tract during the procedure and reduce the bloating. If you had a lower endoscopy (such as a colonoscopy or flexible sigmoidoscopy) you may notice spotting of blood in your stool or on the toilet paper. If you underwent a bowel prep for your procedure, you may not have a normal bowel movement for a few days.  Please Note:  You might notice some irritation and congestion in your nose or some drainage.  This is from the oxygen used during your procedure.  There is no need for concern and it should clear up in a day or so.  SYMPTOMS TO REPORT IMMEDIATELY:   Following lower endoscopy (colonoscopy or flexible sigmoidoscopy):  Excessive amounts of blood in the stool  Significant tenderness or worsening of abdominal pains  Swelling of the abdomen that is new, acute  Fever of 100F or higher  For urgent or emergent issues, a gastroenterologist can be reached at any hour by calling [phone]. Do not use  MyChart messaging for urgent concerns.    DIET:  We do recommend a small meal at first, but then you may proceed to your regular diet.  Drink plenty of fluids but you should avoid alcoholic beverages for 24 hours.  ACTIVITY:  You should plan to take it easy for the rest of today and you should NOT DRIVE or use heavy machinery until tomorrow (because of the sedation medicines used during the test).    FOLLOW UP: Our staff will call the number listed on your records 48-72 hours following your procedure to check on you and address any questions or concerns that you may have regarding the information given to you following your procedure. If we do not reach you, we will leave a message.  We will attempt to reach you two times.  During this call, we will ask if you have developed any symptoms of COVID 19. If you develop any symptoms (ie: fever, flu-like symptoms, shortness of breath, cough etc.) before then, please call [phone].  If you test positive for Covid 19 in the 2 weeks post procedure, please call and report this information to [place].    If any biopsies were taken you will be contacted by phone or by letter within the next 1-3 weeks.  Please call us at [phone] if you have not heard about the biopsies in 3 weeks.    SIGNATURES/CONFIDENTIALITY: You and/or your care partner have signed paperwork which  will be entered into your electronic medical record.  These signatures attest to the fact that that the information above on your After Visit Summary has been reviewed and is understood.  Full responsibility of the confidentiality of this discharge information lies with you and/or your care-partner.

## 2021-01-27 NOTE — Progress Notes (Signed)
VS- Troy Olson 

## 2021-01-27 NOTE — Progress Notes (Signed)
BP 154/114, Labetalol given IV, MD update, vss

## 2021-01-27 NOTE — Progress Notes (Signed)
BP   141/99, Labetalol given IV, MD update, vss

## 2021-01-29 ENCOUNTER — Telehealth: Payer: Self-pay

## 2021-01-29 IMAGING — CR DG CHEST 2V
2 series · 2 of 2 positions shown · non-contrast
Comparison: 06/16/2016

CLINICAL DATA: Chest pain.  Shortness of breath.

EXAM:
CHEST - 2 VIEW

[w chest pa]
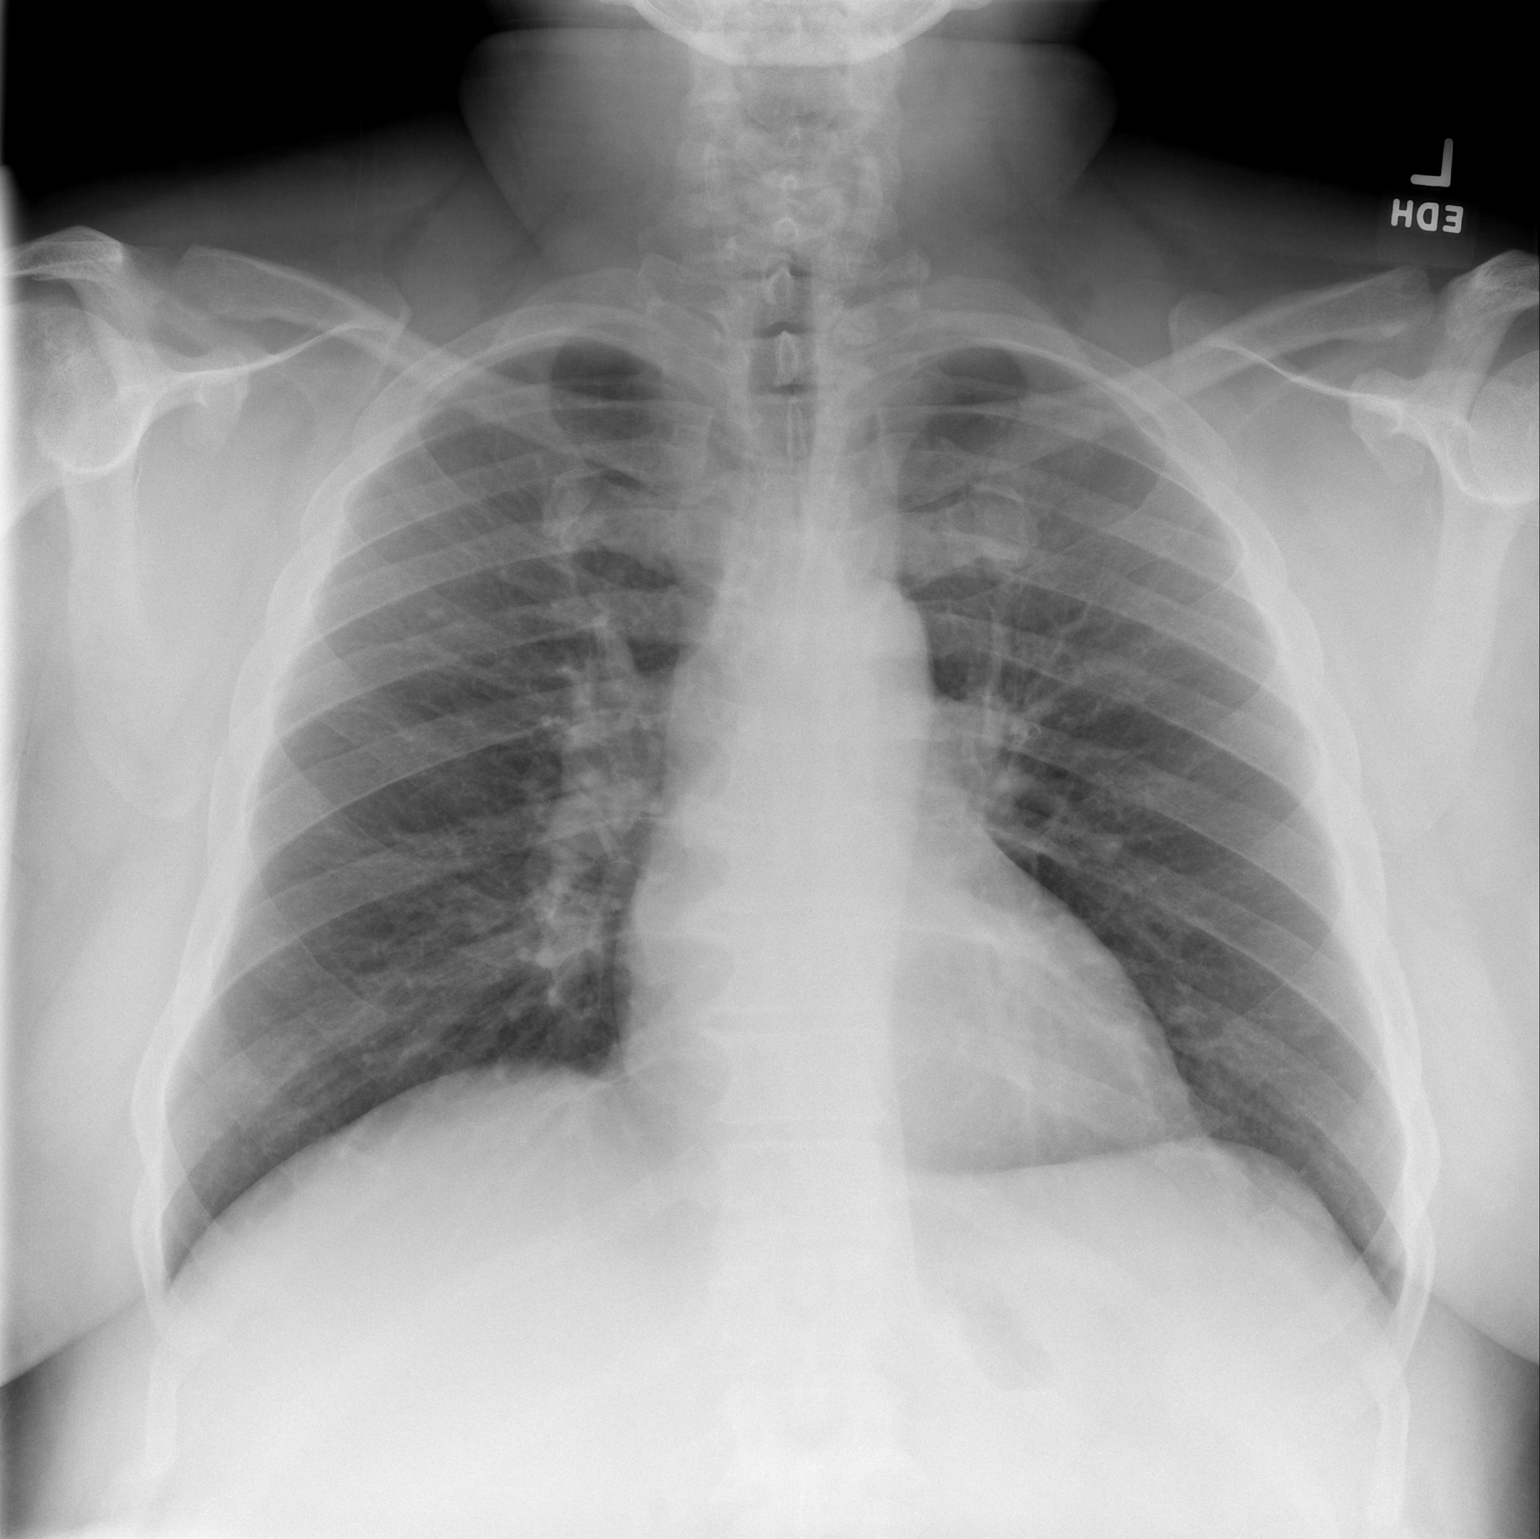

[w chest lat]
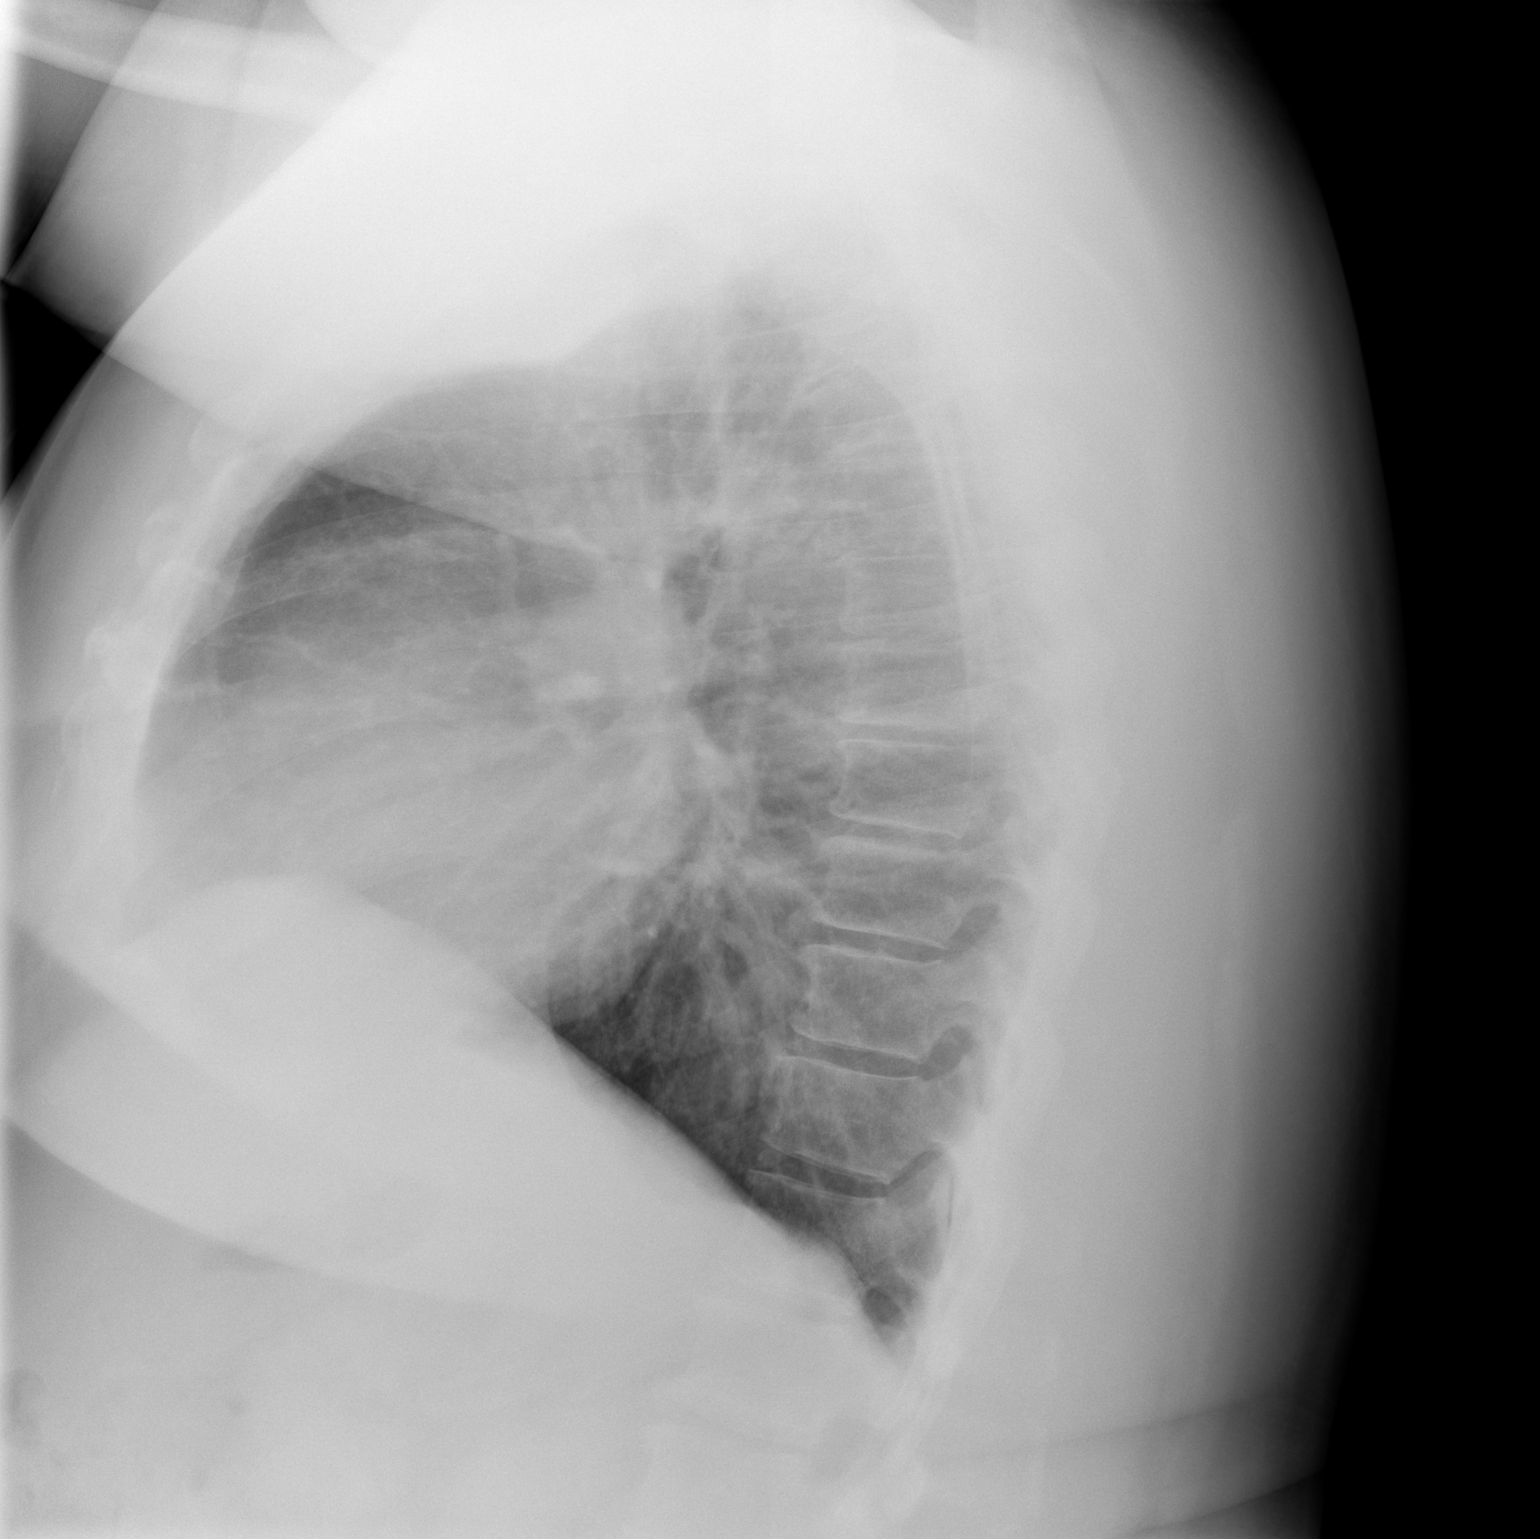

[2 of 2 positions shown; findings below may reference images not displayed]

FINDINGS: Heart size within normal limits. Similar right hilar prominence.
Both lungs are clear. No pleural effusions. No pneumothorax. The
visualized skeletal structures are unremarkable.
IMPRESSION: 1. No acute cardiopulmonary disease.
2. Similar right hilar prominence, which may represent hilar
adenopathy given findings on prior CT from 06/16/2016.

## 2021-01-29 NOTE — Telephone Encounter (Signed)
  Follow up Call-  Call back number 01/27/2021  Post procedure Call Back phone  # [phone]  Permission to leave phone message Yes  Some recent data might be hidden     Patient questions:  Do you have a fever, pain , or abdominal swelling? No. Pain Score  0 *  Have you tolerated food without any problems? Yes.    Have you been able to return to your normal activities? Yes.    Do you have any questions about your discharge instructions: Diet   No. Medications  No. Follow up visit  No.  Do you have questions or concerns about your Care? No.  Actions: * If pain score is 4 or above: No action needed, pain <4.  1. Have you developed a fever since your procedure? no  2.   Have you had an respiratory symptoms (SOB or cough) since your procedure? no  3.   Have you tested positive for COVID 19 since your procedure no  4.   Have you had any family members/close contacts diagnosed with the COVID 19 since your procedure?  no   If yes to any of these questions please route to Laverna Peace, RN and Karlton Lemon, RN

## 2021-01-30 ENCOUNTER — Ambulatory Visit: Payer: Managed Care, Other (non HMO) | Admitting: Nurse Practitioner

## 2021-02-03 NOTE — Addendum Note (Signed)
Addended by: Janalyn Harder on: 02/03/2021 06:52 AM   Modules accepted: Level of Service

## 2021-03-19 ENCOUNTER — Other Ambulatory Visit: Payer: Self-pay

## 2021-03-19 ENCOUNTER — Encounter: Payer: Self-pay | Admitting: Nurse Practitioner

## 2021-03-19 ENCOUNTER — Ambulatory Visit: Payer: Managed Care, Other (non HMO) | Admitting: Nurse Practitioner

## 2021-03-19 ENCOUNTER — Other Ambulatory Visit: Payer: Self-pay | Admitting: Nurse Practitioner

## 2021-03-19 VITALS — BP 136/78 | HR 80 | Temp 98.2°F | Ht 67.8 in | Wt 314.6 lb

## 2021-03-19 DIAGNOSIS — Z6841 Body Mass Index (BMI) 40.0 and over, adult: Secondary | ICD-10-CM

## 2021-03-19 DIAGNOSIS — R7303 Prediabetes: Secondary | ICD-10-CM | POA: Diagnosis not present

## 2021-03-19 DIAGNOSIS — I10 Essential (primary) hypertension: Secondary | ICD-10-CM | POA: Diagnosis not present

## 2021-03-19 LAB — CMP14+EGFR
ALT: 15 IU/L (ref 0–44)
AST: 20 IU/L (ref 0–40)
Albumin/Globulin Ratio: 1.5 (ref 1.2–2.2)
Albumin: 4.5 g/dL (ref 4.0–5.0)
Alkaline Phosphatase: 60 IU/L (ref 44–121)
BUN/Creatinine Ratio: 12 (ref 9–20)
BUN: 13 mg/dL (ref 6–24)
Bilirubin Total: 0.3 mg/dL (ref 0.0–1.2)
CO2: 24 mmol/L (ref 20–29)
Calcium: 9.4 mg/dL (ref 8.7–10.2)
Chloride: 100 mmol/L (ref 96–106)
Creatinine, Ser: 1.12 mg/dL (ref 0.76–1.27)
Globulin, Total: 3 g/dL (ref 1.5–4.5)
Glucose: 81 mg/dL (ref 65–99)
Potassium: 4.1 mmol/L (ref 3.5–5.2)
Sodium: 139 mmol/L (ref 134–144)
Total Protein: 7.5 g/dL (ref 6.0–8.5)
eGFR: 82 mL/min/{1.73_m2} (ref >59)

## 2021-03-19 LAB — HEMOGLOBIN A1C
Est. average glucose Bld gHb Est-mCnc: 120 mg/dL
Hgb A1c MFr Bld: 5.8 % — ABNORMAL HIGH (ref 4.8–5.6)

## 2021-03-19 NOTE — Progress Notes (Signed)
I,Troy Olson,acting as a Education administrator for [employer], NP.,have documented all relevant documentation on the behalf of [employer], NP,as directed by  Troy Castilla, NP while in the presence of Troy Castilla, NP.  This visit occurred during the SARS-CoV-2 public health emergency.  Safety protocols were in place, including screening questions prior to the visit, additional usage of staff PPE, and extensive cleaning of exam room while observing appropriate contact time as indicated for disinfecting solutions.  Subjective:     Patient ID: Troy Olson , male    DOB: 01/30/1971 , 50 y.o.   MRN: [id number]   Chief Complaint  Patient presents with  . Hypertension    HPI  Patient here for a f/u on his blood pressure and prediabetes with medication follow up. He went to the GI doctor in April and his bp was elevated. He does have a hernia and has visited the Psychologist, sport and exercise. Today in office his BP was 136/78 at goal.  Diet: he is trying to decrease his salt and sweet intake.   Exercise: He is trying and will get a membership and start walking.    BP Readings from Last 3 Encounters: 03/19/21 : 136/78 01/27/21 : Marland Kitchen 142/93 12/12/20 : (!) 128/92   Hypertension The current episode started more than 1 year ago. The problem is controlled. Pertinent negatives include no anxiety, chest pain, headaches, palpitations or shortness of breath. There are no associated agents to hypertension. Risk factors for coronary artery disease include obesity and sedentary lifestyle. Past treatments include diuretics. There are no compliance problems.  There is no history of angina or kidney disease. There is no history of chronic renal disease.     Past Medical History:  Diagnosis Date  . Allergies   . Allergy   . Arthritis   . Asthma   . Back pain   . Edema of both lower extremities   . Food allergy   . HLD (hyperlipidemia)   . Hypertension   . Morbid obesity (Troy Olson)   . Prediabetes   . Sleep  apnea    CPAP     Family History  Problem Relation Age of Onset  . Hypertension Father   . Heart disease Father        stents placed  . Cancer Sister   . Ovarian cancer Sister        male cancer ? if ovaries  . Asthma Son   . Colon cancer Neg Hx   . Esophageal cancer Neg Hx   . Stomach cancer Neg Hx   . Rectal cancer Neg Hx      Current Outpatient Medications:  .  albuterol (VENTOLIN HFA) 108 (90 Base) MCG/ACT inhaler, Inhale 2 puffs into the lungs every 6 (six) hours as needed. wheezing, Disp: 1 each, Rfl: 5 .  Blood Pressure Monitoring (BLOOD PRESSURE MONITOR/L CUFF) MISC, Use to check blood pressure, Disp: 1 each, Rfl: 0 .  cetirizine (ZYRTEC) 10 MG tablet, Take 10 mg by mouth daily., Disp: , Rfl:  .  halobetasol (ULTRAVATE) 0.05 % cream, dispense cream, Disp: 50 g, Rfl: 1 .  hydrochlorothiazide (HYDRODIURIL) 12.5 MG tablet, TAKE 1 TABLET BY MOUTH  DAILY, Disp: 90 tablet, Rfl: 3 .  metFORMIN (GLUCOPHAGE) 500 MG tablet, Take 1 tablet (500 mg total) by mouth 2 (two) times daily with a meal., Disp: 180 tablet, Rfl: 1 .  montelukast (SINGULAIR) 10 MG tablet, TAKE 1 TABLET BY MOUTH  DAILY, Disp: 90 tablet, Rfl: 3 .  naproxen (NAPROSYN)  500 MG tablet, Take 1 tablet (500 mg total) by mouth 2 (two) times daily with a meal., Disp: 30 tablet, Rfl: 0 .  pravastatin (PRAVACHOL) 40 MG tablet, TAKE 1 TABLET BY MOUTH IN  THE EVENING, Disp: 90 tablet, Rfl: 3   Allergies  Allergen Reactions  . Food Allergy Formula Shortness Of Breath and Swelling    Any nuts of any kind.  . Peanut-Containing Drug Products Shortness Of Breath, Diarrhea, Nausea And Vomiting and Swelling     Review of Systems  Constitutional: Negative.  Negative for chills and fever.  HENT: Negative for sinus pain.   Respiratory: Negative for cough, choking, shortness of breath and wheezing.   Cardiovascular: Negative.  Negative for chest pain and palpitations.  Gastrointestinal: Negative.  Negative for diarrhea and  nausea.  Neurological: Negative.  Negative for dizziness, numbness and headaches.     Today's Vitals   03/19/21 1035  BP: 136/78  Pulse: 80  Temp: 98.2 F (36.8 C)  TempSrc: Oral  Weight: (!) 314 lb 9.6 oz (142.7 kg)  Height: 5' 7.8" (1.722 m)   Body mass index is 48.12 kg/m.  Wt Readings from Last 3 Encounters:  03/19/21 (!) 314 lb 9.6 oz (142.7 kg)  01/27/21 (!) 317 lb (143.8 kg)  12/12/20 (!) 317 lb (143.8 kg)    Objective:  Physical Exam Constitutional:      Appearance: Normal appearance. He is obese.  HENT:     Head: Normocephalic and atraumatic.  Cardiovascular:     Rate and Rhythm: Normal rate and regular rhythm.     Pulses: Normal pulses.     Heart sounds: Normal heart sounds. No murmur heard.   Pulmonary:     Effort: Pulmonary effort is normal. No respiratory distress.     Breath sounds: Normal breath sounds. No wheezing.  Skin:    Capillary Refill: Capillary refill takes less than 2 seconds.  Neurological:     Mental Status: He is alert.  Psychiatric:        Behavior: Behavior normal.         Assessment And Plan:     1. Essential hypertension -BP today was 136/78 -Advised patient to check BP every day for a week and send a msg in [place] or call the office with his reading. I will assess and add/increase BP med if needed.  -Advised the patient to change his diet and exercise.  -Limit the intake of processed foods and salt intake. You should increase your intake of green vegetables and fruits. Limit the use of alcohol. Limit fast foods and fried foods. Avoid high fatty saturated and trans fat foods. Keep yourself hydrated with drinking water. Avoid red meats. Eat lean meats instead. Exercise for atleast 30-45 min for atleast 4-5 times a week.  - CMP14+EGFR  2. Prediabetes -Patient currently takes metformin twice daily. -Will check hgb a1c today. Last was 6.0 on 10/2020  --Discussed with patient the importance of glydcemic control and long term  complications from uncontrolled diabetes. Discussed with the patient the importance of compliance with home glucose monitoring, diet which includes decrease amount of sugary drinks and foods. Importance of exercise was also discussed with the patient. Importance of eye exams, self foot care and compliance to office visits was also discussed with the patient.  - Hemoglobin A1c  3. BMI 45.0-49.9, adult [facility]) Advised patient on a healthy diet including avoiding fast food and red meats. Increase the intake of lean meats including grilled chicken and [place].  Drink a lot of water. Decrease intake of fatty foods. Exercise for 30-45 min. 4-5 a week to decrease the risk of cardiac event.   The patient was encouraged to call or send a message through [place] for any questions or concerns.   Side effects and appropriate use of all the medication(s) were discussed with the patient today. Patient advised to use the medication(s) as directed by their healthcare provider. The patient was encouraged to read, review, and understand all associated package inserts and contact our office with any questions or concerns. The patient accepts the risks of the treatment plan and had an opportunity to ask questions.   Staying healthy and adopting a healthy lifestyle for your overall health is important. You should eat 7 or more servings of fruits and vegetables per day. You should drink plenty of water to keep yourself hydrated and your kidneys healthy. This includes about 65-80+ fluid ounces of water. Limit your intake of animal fats especially for elevated cholesterol. Avoid highly processed food and limit your salt intake if you have hypertension. Avoid foods high in saturated/Trans fats. Along with a healthy diet it is also very important to maintain time for yourself to maintain a healthy mental health with low stress levels. You should get atleast 150 min of moderate intensity exercise weekly for a healthy heart. Along with  eating right and exercising, aim for at least 7-9 hours of sleep daily.  Eat more whole grains which includes barley, wheat berries, oats, brown rice and whole wheat pasta. Use healthy plant oils which include olive, soy, corn, sunflower and peanut. Limit your caffeine and sugary drinks. Limit your intake of fast foods. Limit milk and dairy products to one or two daily servings.   Follow up: if symptoms persist or do not get better.   Patient was given opportunity to ask questions. Patient verbalized understanding of the plan and was able to repeat key elements of the plan. All questions were answered to their satisfaction.  Raman Ghumman, DNP   I, Raman Ghumman have reviewed all documentation for this visit. The documentation on 03/19/21 for the exam, diagnosis, procedures, and orders are all accurate and complete.    IF YOU HAVE BEEN REFERRED TO A SPECIALIST, IT MAY TAKE 1-2 WEEKS TO SCHEDULE/PROCESS THE REFERRAL. IF YOU HAVE NOT HEARD FROM US/SPECIALIST IN TWO WEEKS, PLEASE GIVE [place] A CALL AT [phone].   THE PATIENT IS ENCOURAGED TO PRACTICE SOCIAL DISTANCING DUE TO THE COVID-19 PANDEMIC.

## 2021-03-19 NOTE — Patient Instructions (Signed)

## 2021-04-15 ENCOUNTER — Other Ambulatory Visit: Payer: Self-pay

## 2021-04-15 ENCOUNTER — Ambulatory Visit: Payer: Managed Care, Other (non HMO) | Admitting: Dermatology

## 2021-04-15 DIAGNOSIS — L80 Vitiligo: Secondary | ICD-10-CM | POA: Diagnosis not present

## 2021-04-15 DIAGNOSIS — L309 Dermatitis, unspecified: Secondary | ICD-10-CM | POA: Diagnosis not present

## 2021-04-15 MED ORDER — HALOBETASOL PROPIONATE 0.05 % EX CREA: TOPICAL_CREAM | CUTANEOUS | 3 refills | Status: DC

## 2021-04-29 ENCOUNTER — Encounter: Payer: Self-pay | Admitting: Dermatology

## 2021-04-29 NOTE — Progress Notes (Signed)
   Follow-Up Visit   Subjective  Troy Olson is a 50 y.o. male who presents for the following: Eczema (3 month follow up. Patients concern is spot on abdomen discoloration. Also elbows had an outbreak last week but patient said everything is doing ok. Using halobetasol. ).  Eczema and discoloration. Location:  Duration:  Quality:  Associated Signs/Symptoms: Modifying Factors:  Severity:  Timing: Context:   Objective  Well appearing patient in no apparent distress; mood and affect are within normal limits. Right Abdomen (side) - Upper Patient using halobetasol; some follicular repigmentation but it is focal.  Left Forearm - Posterior, Right Forearm - Posterior Eczema v/s psoriasis follow up halobetasol helping; perhaps 50% reduction in active dermatitis with some PIH.    All skin waist up examined.   Assessment & Plan    Vitiligo Right Abdomen (side) - Upper  Suggested 6 months was an appropriate time trial for topical halobetasol.  If there is no continued repigmentation I have asked [name] to contact me and we may switch to topical tacrolimus.  Related Medications halobetasol (ULTRAVATE) 0.05 % cream Apply to rash qd -bid ok to fill 3 months at a time  Eczema, unspecified type Left Forearm - Posterior; Right Forearm - Posterior  We will try tapering the topical halobetasol, maintain skin hydration.  Follow-up can be on a as needed basis.  Related Medications halobetasol (ULTRAVATE) 0.05 % cream Apply to rash qd -bid ok to fill 3 months at a time      I, Janalyn Harder, MD, have reviewed all documentation for this visit.  The documentation on 04/29/21 for the exam, diagnosis, procedures, and orders are all accurate and complete.

## 2021-06-09 ENCOUNTER — Encounter: Payer: Self-pay | Admitting: Nurse Practitioner

## 2021-06-09 ENCOUNTER — Ambulatory Visit: Payer: Managed Care, Other (non HMO) | Admitting: Nurse Practitioner

## 2021-06-09 ENCOUNTER — Other Ambulatory Visit: Payer: Self-pay

## 2021-06-09 VITALS — BP 138/78 | HR 74 | Temp 98.1°F | Ht 67.0 in | Wt 315.6 lb

## 2021-06-09 DIAGNOSIS — I10 Essential (primary) hypertension: Secondary | ICD-10-CM

## 2021-06-09 DIAGNOSIS — R7303 Prediabetes: Secondary | ICD-10-CM

## 2021-06-09 DIAGNOSIS — R42 Dizziness and giddiness: Secondary | ICD-10-CM

## 2021-06-09 DIAGNOSIS — Z6841 Body Mass Index (BMI) 40.0 and over, adult: Secondary | ICD-10-CM

## 2021-06-09 MED ORDER — MAGNESIUM 250 MG PO TABS: 1.0000 | ORAL_TABLET | Freq: Every day | ORAL | 0 refills | Status: AC

## 2021-06-09 NOTE — Progress Notes (Signed)
I,[name],acting as a Neurosurgeon for [employer], FNP.,have documented all relevant documentation on the behalf of [name], FNP,as directed by  [name], FNP while in the presence of [name], FNP.  This visit occurred during the SARS-CoV-2 public health emergency.  Safety protocols were in place, including screening questions prior to the visit, additional usage of staff PPE, and extensive cleaning of exam room while observing appropriate contact time as indicated for disinfecting solutions.  Subjective:     Patient ID: Troy Olson , male    DOB: 02-Aug-1971 , 50 y.o.   MRN: [id number]   Chief Complaint  Patient presents with   Hypertension    HPI  Patient here for a f/u on his blood pressure and prediabetes with medication follow up. Patient states that he went for his wellness exam and his BP was elevated. Orthostatics completed. He is drinking 5-6 bottles of water daily. He does drive a dump truck daily. He had labs done at [facility] the day he went for his wellness exam. Through [facility] - Total cholesterol 198, LDL 131, triglycerides 63.   Creatinine 1.01, HgbA1c 5.8,  BP Readings from Last 3 Encounters: 06/09/21 : 138/78 03/19/21 : 136/78 01/27/21 : (!) 142/93  Wt Readings from Last 3 Encounters: 06/09/21 : (!) 315 lb 9.6 oz (143.2 kg) 03/19/21 : (!) 314 lb 9.6 oz (142.7 kg) 01/27/21 : (!) 317 lb (143.8 kg)  Hypertension This is a chronic problem. The current episode started more than 1 year ago. The problem is controlled. Pertinent negatives include no anxiety, chest pain, headaches, palpitations or shortness of breath. There are no associated agents to hypertension. Risk factors for coronary artery disease include obesity and sedentary lifestyle. Past treatments include diuretics. There are no compliance problems.  There is no history of angina or kidney disease. There is no history of chronic renal disease.    Past Medical History:  Diagnosis Date    Allergies    Allergy    Arthritis    Asthma    Back pain    Edema of both lower extremities    Food allergy    HLD (hyperlipidemia)    Hypertension    Morbid obesity ([facility])    Prediabetes    Sleep apnea    CPAP     Family History  Problem Relation Age of Onset   Hypertension Father    Heart disease Father        stents placed   Cancer Sister    Ovarian cancer Sister        male cancer ? if ovaries   Asthma Son    Colon cancer Neg Hx    Esophageal cancer Neg Hx    Stomach cancer Neg Hx    Rectal cancer Neg Hx      Current Outpatient Medications:    Magnesium 250 MG TABS, Take 1 tablet (250 mg total) by mouth daily. With evening meal, Disp: 30 tablet, Rfl: 0   albuterol (VENTOLIN HFA) 108 (90 Base) MCG/ACT inhaler, Inhale 2 puffs into the lungs every 6 (six) hours as needed. wheezing, Disp: 1 each, Rfl: 5   Blood Pressure Monitoring (BLOOD PRESSURE MONITOR/L CUFF) MISC, Use to check blood pressure, Disp: 1 each, Rfl: 0   cetirizine (ZYRTEC) 10 MG tablet, Take 10 mg by mouth daily., Disp: , Rfl:    halobetasol (ULTRAVATE) 0.05 % cream, Apply to rash qd -bid ok to fill 3 months at a time, Disp: 50 g,  Rfl: 3   hydrochlorothiazide (HYDRODIURIL) 12.5 MG tablet, TAKE 1 TABLET BY MOUTH  DAILY, Disp: 90 tablet, Rfl: 3   metFORMIN (GLUCOPHAGE) 500 MG tablet, Take 1 tablet (500 mg total) by mouth 2 (two) times daily with a meal., Disp: 180 tablet, Rfl: 1   montelukast (SINGULAIR) 10 MG tablet, TAKE 1 TABLET BY MOUTH  DAILY, Disp: 90 tablet, Rfl: 3   naproxen (NAPROSYN) 500 MG tablet, Take 1 tablet (500 mg total) by mouth 2 (two) times daily with a meal., Disp: 30 tablet, Rfl: 0   pravastatin (PRAVACHOL) 40 MG tablet, TAKE 1 TABLET BY MOUTH IN  THE EVENING, Disp: 90 tablet, Rfl: 3   Allergies  Allergen Reactions   Food Allergy Formula Shortness Of Breath and Swelling    Any nuts of any kind.   Peanut-Containing Drug Products Shortness Of Breath, Diarrhea, Nausea And Vomiting and  Swelling     Review of Systems  Constitutional: Negative.   Respiratory: Negative.  Negative for shortness of breath.   Cardiovascular: Negative.  Negative for chest pain, palpitations and leg swelling.  Gastrointestinal: Negative.   Neurological:  Positive for light-headedness (intermittent, took a few minutes to get acclimated when getting out of truck.). Negative for dizziness and headaches.  Psychiatric/Behavioral: Negative.      Today's Vitals   06/09/21 1454  BP: 138/78  Pulse: 74  Temp: 98.1 F (36.7 C)  TempSrc: Oral  Weight: (!) 315 lb 9.6 oz (143.2 kg)  Height: 5\' 7"  (1.702 m)   Body mass index is 49.43 kg/m.  Wt Readings from Last 3 Encounters:  06/09/21 (!) 315 lb 9.6 oz (143.2 kg)  03/19/21 (!) 314 lb 9.6 oz (142.7 kg)  01/27/21 (!) 317 lb (143.8 kg)    Objective:  Physical Exam Constitutional:      General: He is not in acute distress.    Appearance: Normal appearance. He is obese.  Cardiovascular:     Rate and Rhythm: Normal rate and regular rhythm.     Pulses: Normal pulses.     Heart sounds: Normal heart sounds. No murmur heard. Pulmonary:     Effort: Pulmonary effort is normal. No respiratory distress.     Breath sounds: Normal breath sounds. No wheezing.  Skin:    General: Skin is warm and dry.  Neurological:     General: No focal deficit present.     Mental Status: He is alert and oriented to person, place, and time.     Cranial Nerves: No cranial nerve deficit.     Motor: No weakness.  Psychiatric:        Mood and Affect: Mood normal.        Behavior: Behavior normal.        Thought Content: Thought content normal.        Judgment: Judgment normal.        Assessment And Plan:     1. Essential hypertension Comments: Blood pressure is same as previous, he will monitoring his blood pressure. he is to also take magnesium nightly - Magnesium 250 MG TABS; Take 1 tablet (250 mg total) by mouth daily. With evening meal  Dispense: 30 tablet;  Refill: 0  2. Prediabetes Stable, continue metformin  3. Class 3 severe obesity with body mass index (BMI) of 45.0 to 49.9 in adult, unspecified obesity type, unspecified whether serious comorbidity present [facility]) He is encouraged to initially strive for BMI less than 30 to decrease cardiac risk. He is advised to exercise no  less than 150 minutes per week.   4. Dizziness Comments: Orthostats are negative Stay well hydrated and if symptoms worsen call to office - [facility]     Patient was given opportunity to ask questions. Patient verbalized understanding of the plan and was able to repeat key elements of the plan. All questions were answered to their satisfaction.  [name], FNP   I, [name], FNP, have reviewed all documentation for this visit. The documentation on 06/19/21 for the exam, diagnosis, procedures, and orders are all accurate and complete.   IF YOU HAVE BEEN REFERRED TO A SPECIALIST, IT MAY TAKE 1-2 WEEKS TO SCHEDULE/PROCESS THE REFERRAL. IF YOU HAVE NOT HEARD FROM US/SPECIALIST IN TWO WEEKS, PLEASE GIVE [place] A CALL AT [phone].   THE PATIENT IS ENCOURAGED TO PRACTICE SOCIAL DISTANCING DUE TO THE COVID-19 PANDEMIC.

## 2021-06-09 NOTE — Patient Instructions (Signed)

## 2021-06-19 ENCOUNTER — Ambulatory Visit: Payer: Managed Care, Other (non HMO) | Admitting: Nurse Practitioner

## 2021-06-19 ENCOUNTER — Encounter: Payer: Self-pay | Admitting: Nurse Practitioner

## 2021-06-19 ENCOUNTER — Other Ambulatory Visit: Payer: Self-pay

## 2021-06-19 VITALS — BP 130/100 | HR 79 | Temp 98.1°F | Ht 67.0 in | Wt 317.2 lb

## 2021-06-19 DIAGNOSIS — I10 Essential (primary) hypertension: Secondary | ICD-10-CM

## 2021-06-19 DIAGNOSIS — Z91018 Allergy to other foods: Secondary | ICD-10-CM

## 2021-06-19 DIAGNOSIS — Z23 Encounter for immunization: Secondary | ICD-10-CM

## 2021-06-19 MED ORDER — HYDROCHLOROTHIAZIDE 12.5 MG PO CAPS: 12.5000 mg | ORAL_CAPSULE | Freq: Two times a day (BID) | ORAL | 0 refills | Status: DC

## 2021-06-19 MED ORDER — EPINEPHRINE 0.3 MG/0.3ML IJ SOAJ: 0.3000 mg | INTRAMUSCULAR | 0 refills | Status: DC | PRN

## 2021-06-19 NOTE — Progress Notes (Signed)
[facility]	[place] as a Neurosurgeon for [employer], NP.,have documented all relevant documentation on the behalf of [employer], NP,as directed by  Troy Ivory, NP while in the presence of Troy Ivory, NP.  This visit occurred during the SARS-CoV-2 public health emergency.  Safety protocols were in place, including screening questions prior to the visit, additional usage of staff PPE, and extensive cleaning of exam room while observing appropriate contact time as indicated for disinfecting solutions.  Subjective:     Patient ID: Troy Olson , male    DOB: 12/04/1970 , 50 y.o.   MRN: [id number]   Chief Complaint  Patient presents with   Hypertension    HPI  Pt presents today for elevated BP reading this morning, reading around 151/100. He does not experience dizziness, headache, blurred vision. Took pts BP first time: 140/100. He does report to drinking plentiful water. He does admit to having a few drinks over the weekend. Denies chest pain, shortness of breath.   Hypertension Associated symptoms include headaches. Pertinent negatives include no chest pain, palpitations or shortness of breath.    Past Medical History:  Diagnosis Date   Allergies    Allergy    Arthritis    Asthma    Back pain    Edema of both lower extremities    Food allergy    HLD (hyperlipidemia)    Hypertension    Morbid obesity ([facility])    Prediabetes    Sleep apnea    CPAP     Family History  Problem Relation Age of Onset   Hypertension Father    Heart disease Father        stents placed   Cancer Sister    Ovarian cancer Sister        male cancer ? if ovaries   Asthma Son    Colon cancer Neg Hx    Esophageal cancer Neg Hx    Stomach cancer Neg Hx    Rectal cancer Neg Hx      Current Outpatient Medications:    albuterol (VENTOLIN HFA) 108 (90 Base) MCG/ACT inhaler, Inhale 2 puffs into the lungs every 6 (six) hours as needed. wheezing, Disp: 1 each, Rfl: 5   Blood  Pressure Monitoring (BLOOD PRESSURE MONITOR/L CUFF) MISC, Use to check blood pressure, Disp: 1 each, Rfl: 0   cetirizine (ZYRTEC) 10 MG tablet, Take 10 mg by mouth daily., Disp: , Rfl:    EPINEPHrine (EPIPEN 2-PAK) 0.3 mg/0.3 mL IJ SOAJ injection, Inject 0.3 mg into the muscle as needed for anaphylaxis., Disp: 1 each, Rfl: 0   halobetasol (ULTRAVATE) 0.05 % cream, Apply to rash qd -bid ok to fill 3 months at a time, Disp: 50 g, Rfl: 3   hydrochlorothiazide (MICROZIDE) 12.5 MG capsule, Take 1 capsule (12.5 mg total) by mouth 2 (two) times daily., Disp: 180 capsule, Rfl: 0   Magnesium 250 MG TABS, Take 1 tablet (250 mg total) by mouth daily. With evening meal, Disp: 30 tablet, Rfl: 0   metFORMIN (GLUCOPHAGE) 500 MG tablet, Take 1 tablet (500 mg total) by mouth 2 (two) times daily with a meal., Disp: 180 tablet, Rfl: 1   montelukast (SINGULAIR) 10 MG tablet, TAKE 1 TABLET BY MOUTH  DAILY, Disp: 90 tablet, Rfl: 3   naproxen (NAPROSYN) 500 MG tablet, Take 1 tablet (500 mg total) by mouth 2 (two) times daily with a meal., Disp: 30 tablet, Rfl: 0   pravastatin (PRAVACHOL) 40 MG tablet, TAKE 1 TABLET BY MOUTH  IN  THE EVENING, Disp: 90 tablet, Rfl: 3   Allergies  Allergen Reactions   Food Allergy Formula Shortness Of Breath and Swelling    Any nuts of any kind.   Peanut-Containing Drug Products Shortness Of Breath, Diarrhea, Nausea And Vomiting and Swelling     Review of Systems  Constitutional: Negative.  Negative for chills, fatigue and fever.  HENT: Negative.  Negative for congestion.   Respiratory:  Negative for cough, shortness of breath and wheezing.   Cardiovascular: Negative.  Negative for chest pain and palpitations.  Gastrointestinal: Negative.   Genitourinary: Negative.   Skin: Negative.   Allergic/Immunologic: Negative.   Neurological:  Positive for headaches.  Hematological: Negative.   Psychiatric/Behavioral: Negative.      Today's Vitals   06/19/21 1538  BP: (!) 130/100   Pulse: 79  Temp: 98.1 F (36.7 C)  Weight: (!) 317 lb 3.2 oz (143.9 kg)  Height: 5\' 7"  (1.702 m)   Body mass index is 49.68 kg/m.  Wt Readings from Last 3 Encounters:  06/19/21 (!) 317 lb 3.2 oz (143.9 kg)  06/09/21 (!) 315 lb 9.6 oz (143.2 kg)  03/19/21 (!) 314 lb 9.6 oz (142.7 kg)    Objective:  Physical Exam Constitutional:      Appearance: Normal appearance. He is obese.  HENT:     Head: Normocephalic and atraumatic.  Cardiovascular:     Rate and Rhythm: Normal rate and regular rhythm.     Pulses: Normal pulses.     Heart sounds: Normal heart sounds. No murmur heard. Pulmonary:     Effort: Pulmonary effort is normal. No respiratory distress.     Breath sounds: Normal breath sounds. No wheezing.  Skin:    General: Skin is warm and dry.     Capillary Refill: Capillary refill takes less than 2 seconds.  Neurological:     Mental Status: He is alert and oriented to person, place, and time.        Assessment And Plan:     1. Essential hypertension -Limit the intake of processed foods and salt intake. You should increase your intake of green vegetables and fruits. Limit the use of alcohol. Limit fast foods and fried foods. Avoid high fatty saturated and trans fat foods. Keep yourself hydrated with drinking water. Avoid red meats. Eat lean meats instead. Exercise for atleast 30-45 min for atleast 4-5 times a week.  -Increased HCTZ to 25 mg - POCT Urinalysis Dipstick (81002) - Microalbumin / creatinine urine ratio - hydrochlorothiazide (MICROZIDE) 12.5 MG capsule; Take 1 capsule (12.5 mg total) by mouth 2 (two) times daily.  Dispense: 180 capsule; Refill: 0  2. Immunization due - Flu Vaccine QUAD 6+ mos PF IM (Fluarix Quad PF)  3. Food allergy - EPINEPHrine (EPIPEN 2-PAK) 0.3 mg/0.3 mL IJ SOAJ injection; Inject 0.3 mg into the muscle as needed for anaphylaxis.  Dispense: 1 each; Refill: 0   Follow up: if symptoms persist or do not get better.   The patient was  encouraged to call or send a message through MyChart for any questions or concerns.   Side effects and appropriate use of all the medication(s) were discussed with the patient today. Patient advised to use the medication(s) as directed by their healthcare provider. The patient was encouraged to read, review, and understand all associated package inserts and contact our office with any questions or concerns. The patient accepts the risks of the treatment plan and had an opportunity to ask questions.   Staying  healthy and adopting a healthy lifestyle for your overall health is important. You should eat 7 or more servings of fruits and vegetables per day. You should drink plenty of water to keep yourself hydrated and your kidneys healthy. This includes about 65-80+ fluid ounces of water. Limit your intake of animal fats especially for elevated cholesterol. Avoid highly processed food and limit your salt intake if you have hypertension. Avoid foods high in saturated/Trans fats. Along with a healthy diet it is also very important to maintain time for yourself to maintain a healthy mental health with low stress levels. You should get atleast 150 min of moderate intensity exercise weekly for a healthy heart. Along with eating right and exercising, aim for at least 7-9 hours of sleep daily.  Eat more whole grains which includes barley, wheat berries, oats, brown rice and whole wheat pasta. Use healthy plant oils which include olive, soy, corn, sunflower and peanut. Limit your caffeine and sugary drinks. Limit your intake of fast foods. Limit milk and dairy products to one or two daily servings.   Patient was given opportunity to ask questions. Patient verbalized understanding of the plan and was able to repeat key elements of the plan. All questions were answered to their satisfaction.  Raman Arik Husmann, DNP   I, Raman Claretta Kendra have reviewed all documentation for this visit. The documentation on 06/19/21 for the exam,  diagnosis, procedures, and orders are all accurate and complete.    IF YOU HAVE BEEN REFERRED TO A SPECIALIST, IT MAY TAKE 1-2 WEEKS TO SCHEDULE/PROCESS THE REFERRAL. IF YOU HAVE NOT HEARD FROM US/SPECIALIST IN TWO WEEKS, PLEASE GIVE [place] A CALL AT [phone].   THE PATIENT IS ENCOURAGED TO PRACTICE SOCIAL DISTANCING DUE TO THE COVID-19 PANDEMIC.

## 2021-06-19 NOTE — Patient Instructions (Signed)

## 2021-07-29 IMAGING — US US ABDOMEN LIMITED RUQ/ASCITES
1 series · 14 of 18 positions shown · non-contrast
Comparison: None.

CLINICAL DATA: Midline abdominal bulge x2 weeks just superior to
the umbilicus.

EXAM:
ULTRASOUND ABDOMEN LIMITED

[Series 1: us abdomen limited ruq/ascites · 0.07mm/px · 18 acquisitions, 14 frames shown]
[im 1/18]
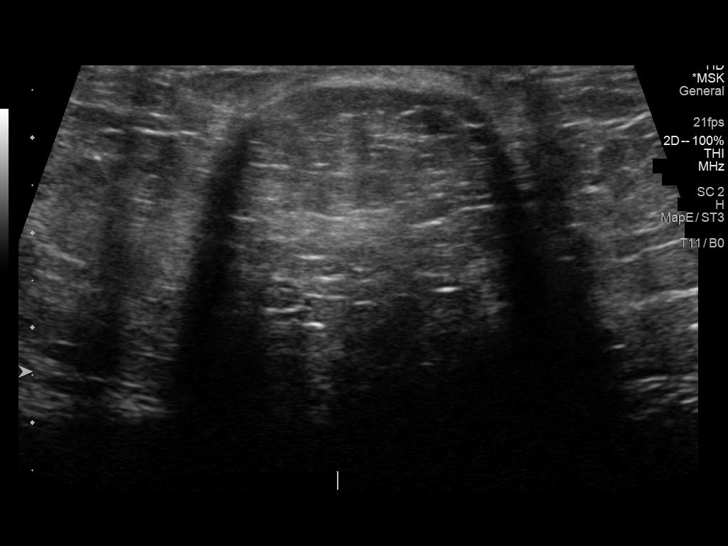
[im 2/18]
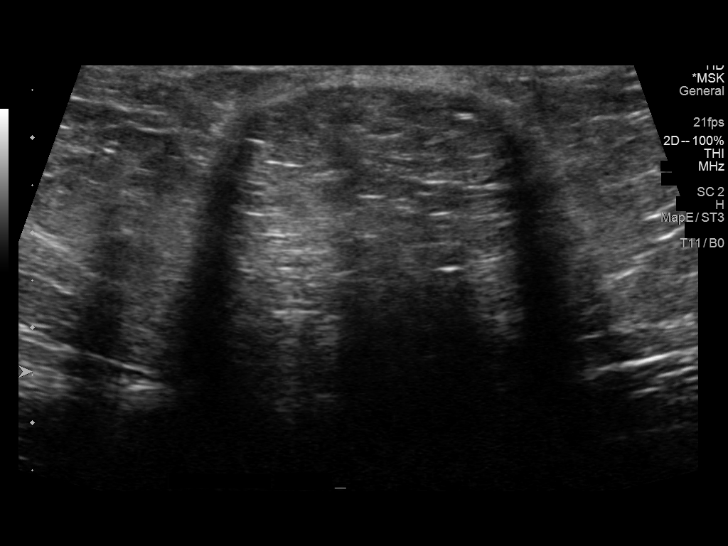
[im 4/18]
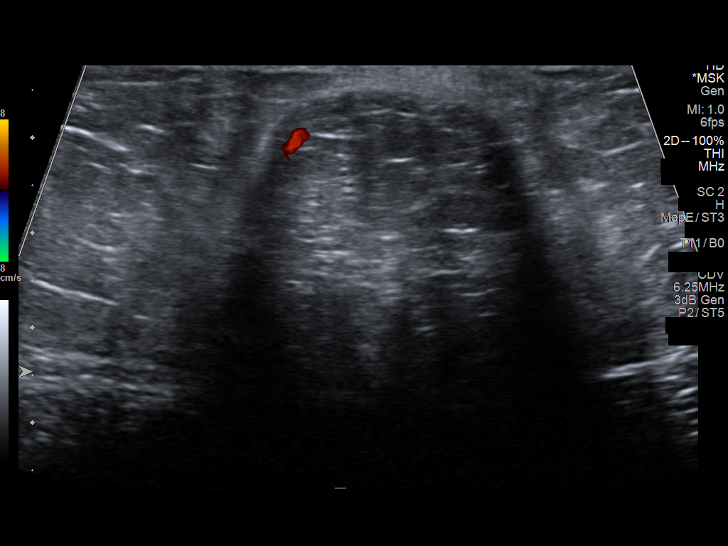
[im 5/18]
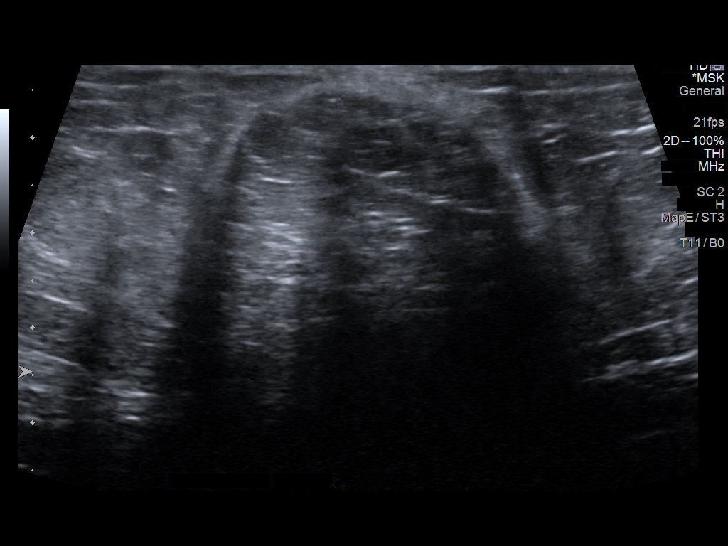
[im 6/18]
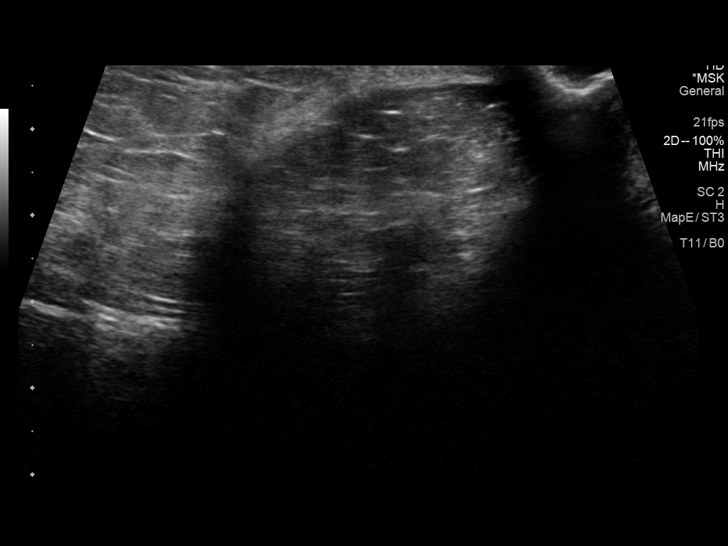
[im 8/18]
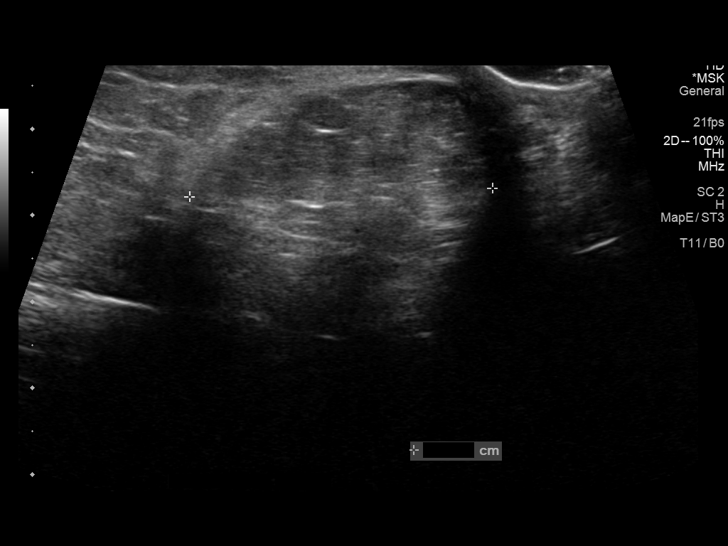
[im 9/18]
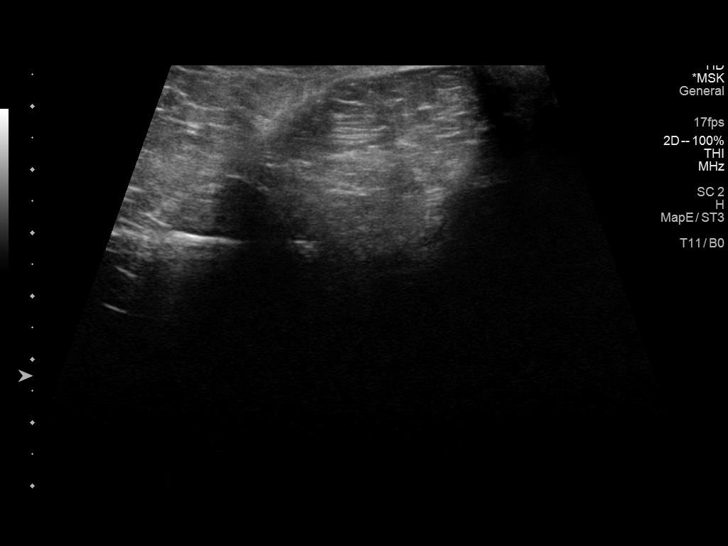
[im 10/18]
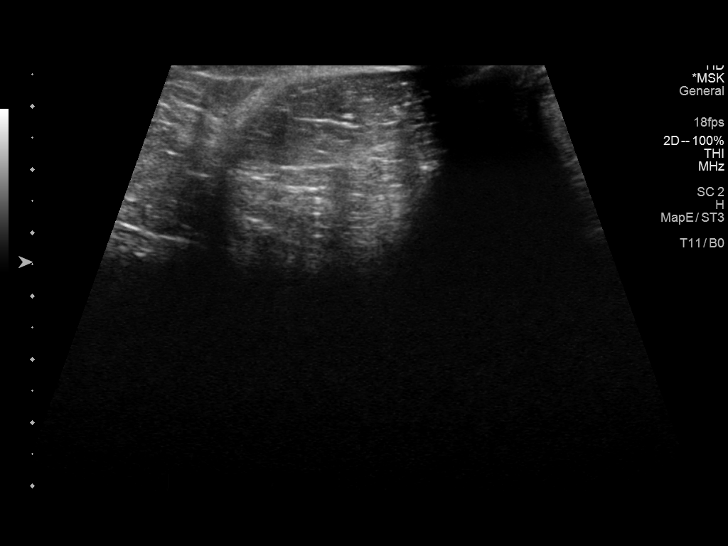
[im 11/18]
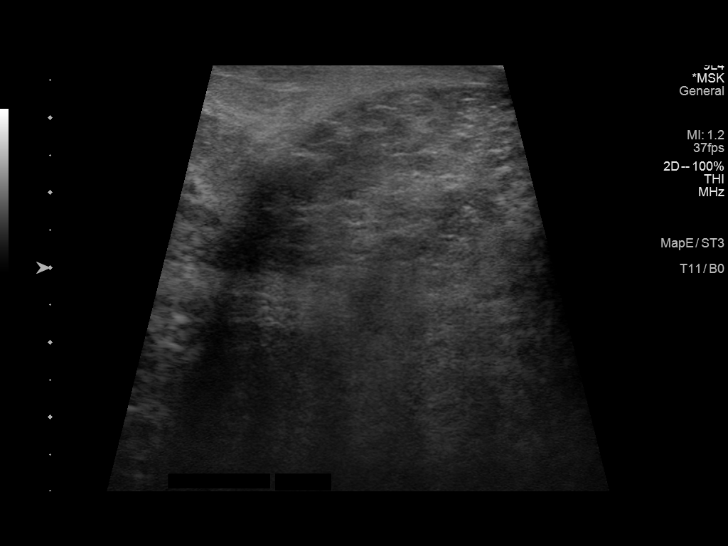
[im 13/18]
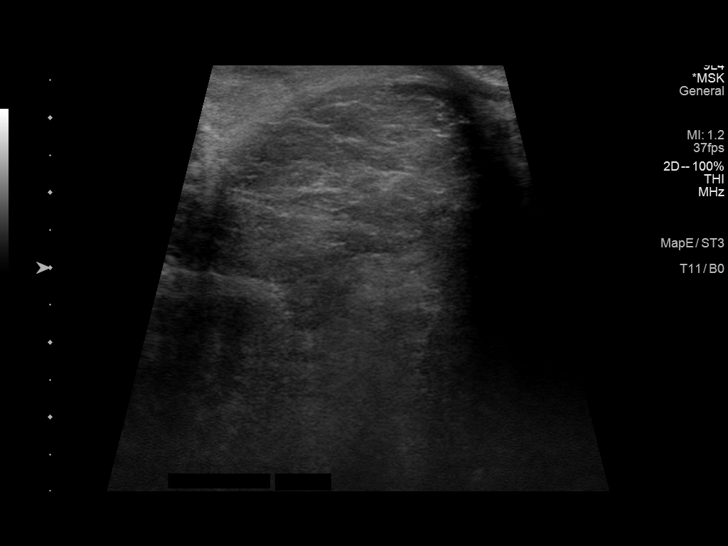
[im 14/18]
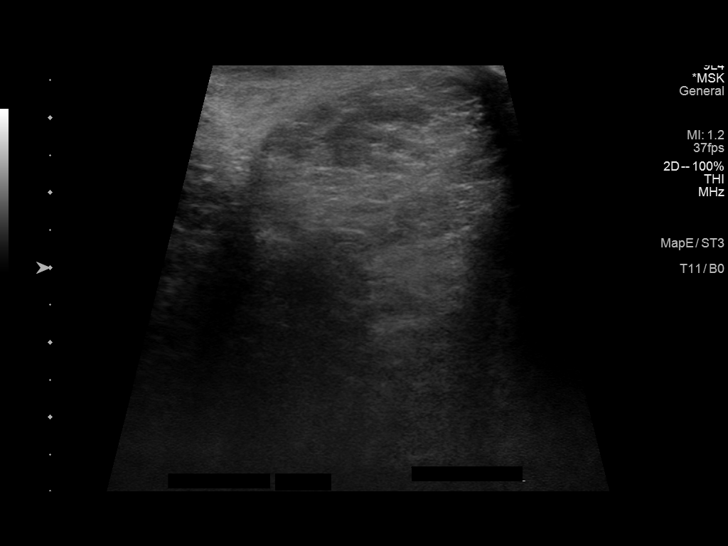
[im 15/18]
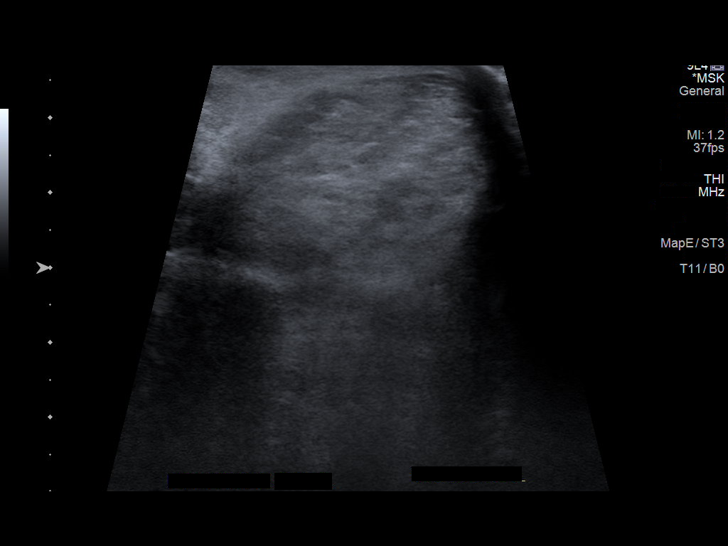
[im 17/18]
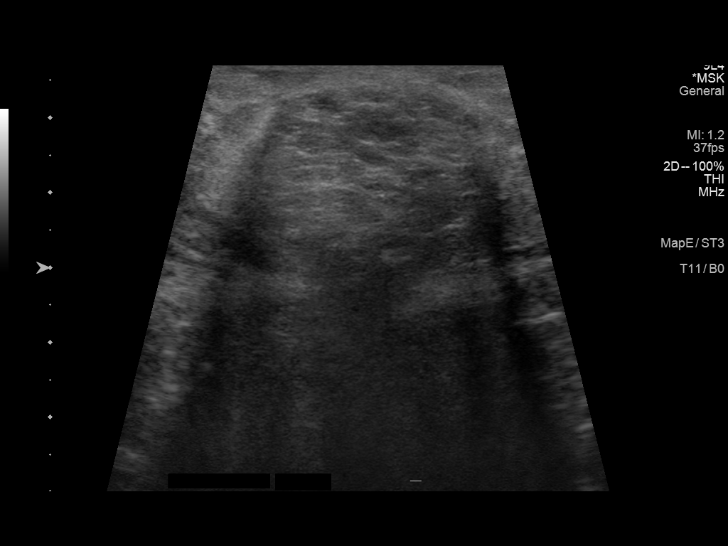
[im 18/18]
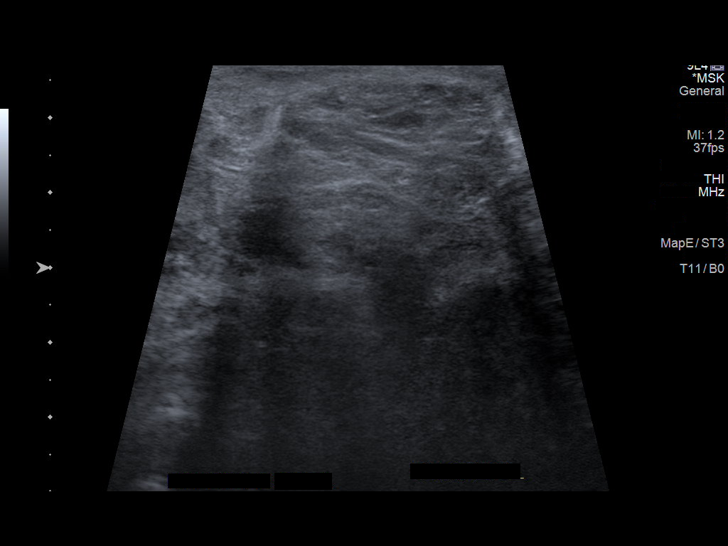

[14 of 18 positions shown; findings below may reference images not displayed]

FINDINGS: Fat containing ventral supraumbilical hernia which does not
significantly change with Valsalva and corresponds with the palpable
abnormality
IMPRESSION: Fat containing ventral hernia which corresponds with patient's
palpable abnormality

## 2021-08-07 ENCOUNTER — Encounter: Payer: Managed Care, Other (non HMO) | Admitting: Nurse Practitioner

## 2021-09-24 ENCOUNTER — Other Ambulatory Visit: Payer: Self-pay | Admitting: Nurse Practitioner

## 2021-10-19 ENCOUNTER — Other Ambulatory Visit: Payer: Self-pay | Admitting: Nurse Practitioner

## 2021-10-27 ENCOUNTER — Other Ambulatory Visit: Payer: Self-pay | Admitting: Nurse Practitioner

## 2021-11-21 ENCOUNTER — Other Ambulatory Visit: Payer: Self-pay | Admitting: Nurse Practitioner

## 2021-11-21 ENCOUNTER — Telehealth: Payer: Self-pay

## 2021-11-21 NOTE — Telephone Encounter (Signed)
Pt needs appt soon for [facility]. Lvm to give the office a call back to sch an appointment.

## 2021-11-26 ENCOUNTER — Other Ambulatory Visit: Payer: Self-pay | Admitting: Nurse Practitioner

## 2021-12-01 ENCOUNTER — Other Ambulatory Visit: Payer: Self-pay

## 2021-12-01 ENCOUNTER — Encounter: Payer: Self-pay | Admitting: Nurse Practitioner

## 2021-12-01 ENCOUNTER — Ambulatory Visit: Payer: Managed Care, Other (non HMO) | Admitting: Nurse Practitioner

## 2021-12-01 VITALS — BP 142/88 | HR 84 | Temp 97.9°F | Ht 67.0 in | Wt 336.0 lb

## 2021-12-01 DIAGNOSIS — Z6841 Body Mass Index (BMI) 40.0 and over, adult: Secondary | ICD-10-CM | POA: Diagnosis not present

## 2021-12-01 DIAGNOSIS — I10 Essential (primary) hypertension: Secondary | ICD-10-CM

## 2021-12-01 DIAGNOSIS — R7309 Other abnormal glucose: Secondary | ICD-10-CM

## 2021-12-01 LAB — BMP8+EGFR
BUN/Creatinine Ratio: 14 (ref 9–20)
BUN: 14 mg/dL (ref 6–24)
CO2: 25 mmol/L (ref 20–29)
Calcium: 9.1 mg/dL (ref 8.7–10.2)
Chloride: 96 mmol/L (ref 96–106)
Creatinine, Ser: 1.01 mg/dL (ref 0.76–1.27)
Glucose: 90 mg/dL (ref 70–99)
Potassium: 3.7 mmol/L (ref 3.5–5.2)
Sodium: 138 mmol/L (ref 134–144)
eGFR: 92 mL/min/{1.73_m2} (ref >59)

## 2021-12-01 MED ORDER — HYDROCHLOROTHIAZIDE 12.5 MG PO CAPS: 12.5000 mg | ORAL_CAPSULE | Freq: Two times a day (BID) | ORAL | 1 refills | Status: DC

## 2021-12-01 MED ORDER — WEGOVY 0.25 MG/0.5ML ~~LOC~~ SOAJ: 0.2500 mg | SUBCUTANEOUS | 0 refills | Status: DC

## 2021-12-01 NOTE — Patient Instructions (Signed)
Cooking With Less Salt Cooking with less salt is one way to reduce the amount of sodium you get from food. Sodium is one of the elements that make up salt. It is found naturally in foods and is also added to certain foods. Depending on your condition and overall health, your health care provider or dietitian may recommend that you reduce your sodium intake. Most people should have less than 2,300 milligrams (mg) of sodium each day. If you have high blood pressure (hypertension), you may need to limit your sodium to 1,500 mg each day. Follow the tipsbelow to help reduce your sodium intake. What are tips for eating less sodium? Reading food labels  Check the food label before buying or using packaged ingredients. Always check the label for the serving size and sodium content. Look for products with no more than 140 mg of sodium in one serving. Check the % Daily Value column to see what percent of the daily recommended amount of sodium is provided in one serving of the product. Foods with 5% or less in this column are considered low in sodium. Foods with 20% or higher are considered high in sodium. Do not choose foods with salt as one of the first three ingredients on the ingredients list. If salt is one of the first three ingredients, it usually means the item is high in sodium.  Shopping Buy sodium-free or low-sodium products. Look for the following words on food labels: Low-sodium. Sodium-free. Reduced-sodium. No salt added. Unsalted. Always check the sodium content even if foods are labeled as low-sodium or no salt added. Buy fresh foods. Cooking Use herbs, seasonings without salt, and spices as substitutes for salt. Use sodium-free baking soda when baking. Grill, braise, or roast foods to add flavor with less salt. Avoid adding salt to pasta, rice, or hot cereals. Drain and rinse canned vegetables, beans, and meat before use. Avoid adding salt when cooking sweets and desserts. Cook with  low-sodium ingredients. What foods are high in sodium? Vegetables Regular canned vegetables (not low-sodium or reduced-sodium). Sauerkraut, pickled vegetables, and relishes. Olives. French fries. Onion rings. Regular canned tomato sauce and paste. Regular tomato and vegetable juice. Frozenvegetables in sauces. Grains Instant hot cereals. Bread stuffing, pancake, and biscuit mixes. Croutons. Seasoned rice or pasta mixes. Noodle soup cups. Boxed or frozen macaroni and cheese. Regular salted crackers. Self-rising flour. Rolls. Bagels. Flourtortillas and wraps. Meats and other proteins Meat or fish that is salted, canned, smoked, cured, spiced, or pickled. This includes bacon, ham, sausages, hot dogs, corned beef, chipped beef, meat loaves, salt pork, jerky, pickled herring, anchovies, regular canned tuna, andsardines. Salted nuts. Dairy Processed cheese and cheese spreads. Cheese curds. Blue cheese. Feta cheese.String cheese. Regular cottage cheese. Buttermilk. Canned milk. The items listed above may not be a complete list of foods high in sodium. Actual amounts of sodium may be different depending on processing. Contact a dietitian for more information. What foods are low in sodium? Fruits Fresh, frozen, or canned fruit with no sauce added. Fruit juice. Vegetables Fresh or frozen vegetables with no sauce added. "No salt added" canned vegetables. "No salt added" tomato sauce and paste. Low-sodium orreduced-sodium tomato and vegetable juice. Grains Noodles, pasta, quinoa, rice. Shredded or puffed wheat or puffed rice. Regular or quick oats (not instant). Low-sodium crackers. Low-sodium bread. Whole-grainbread and whole-grain pasta. Unsalted popcorn. Meats and other proteins Fresh or frozen whole meats, poultry (not injected with sodium), and fish with no sauce added. Unsalted nuts. Dried peas, beans, and   lentils without added salt. Unsalted canned beans. Eggs. Unsalted nut butters. Low-sodium canned  tunaor chicken. Dairy Milk. Soy milk. Yogurt. Low-sodium cheeses, such as Swiss, Monterey Jack, mozzarella, and ricotta. Sherbet or ice cream (keep to  cup per serving).Cream cheese. Fats and oils Unsalted butter or margarine. Other foods Homemade pudding. Sodium-free baking soda and baking powder. Herbs and spices.Low-sodium seasoning mixes. Beverages Coffee and tea. Carbonated beverages. The items listed above may not be a complete list of foods low in sodium. Actual amounts of sodium may be different depending on processing. Contact a dietitian for more information. What are some salt alternatives when cooking? The following are herbs, seasonings, and spices that can be used instead of salt to flavor your food. Herbs should be fresh or dried. Do not choose packaged mixes. Next to the name of the herb, spice, or seasoning aresome examples of foods you can pair it with. Herbs [place] leaves - Soups, meat and vegetable dishes, and spaghetti sauce. Basil - Italian dishes, soups, pasta, and fish dishes. Cilantro - Meat, poultry, and vegetable dishes. Chili powder - Marinades and Mexican dishes. Chives - Salad dressings and potato dishes. Cumin - Mexican dishes, couscous, and meat dishes. Dill - Fish dishes, sauces, and salads. Fennel - Meat and vegetable dishes, breads, and cookies. Garlic (do not use garlic salt) - Italian dishes, meat dishes, salad dressings, and sauces. Marjoram - Soups, potato dishes, and meat dishes. Oregano - Pizza and spaghetti sauce. Parsley - Salads, soups, pasta, and meat dishes. Rosemary - Italian dishes, salad dressings, soups, and red meats. Saffron - Fish dishes, pasta, and some poultry dishes. Sage - Stuffings and sauces. Tarragon - Fish and poultry dishes. Thyme - Stuffing, meat, and fish dishes. Seasonings Lemon juice - Fish dishes, poultry dishes, vegetables, and salads. Vinegar - Salad dressings, vegetables, and fish dishes. Spices Cinnamon - Sweet  dishes, such as cakes, cookies, and puddings. Cloves - Gingerbread, puddings, and marinades for meats. Curry - Vegetable dishes, fish and poultry dishes, and stir-fry dishes. Ginger - Vegetable dishes, fish dishes, and stir-fry dishes. Nutmeg - Pasta, vegetables, poultry, fish dishes, and custard. Summary Cooking with less salt is one way to reduce the amount of sodium that you get from food. Buy sodium-free or low-sodium products. Check the food label before using or buying packaged ingredients. Use herbs, seasonings without salt, and spices as substitutes for salt in foods. This information is not intended to replace advice given to you by your health care provider. Make sure you discuss any questions you have with your healthcare provider. Document Revised: 09/20/2019 Document Reviewed: 09/20/2019 Elsevier Patient Education  2022 Elsevier Inc.  

## 2021-12-01 NOTE — Progress Notes (Signed)
I,Katawbba Wiggins,acting as a Education administrator for [employer], FNP.,have documented all relevant documentation on the behalf of Troy Brine, FNP,as directed by  Troy Brine, FNP while in the presence of Troy Olson, Troy Olson.   This visit occurred during the SARS-CoV-2 public health emergency. Safety protocols were in place, including screening questions prior to the visit, additional usage of staff PPE, and extensive cleaning of exam room while observing appropriate contact time as indicated for disinfecting solutions.  Subjective:     Patient ID: Troy Olson , male    DOB: 11/29/1970 , 51 y.o.   MRN: [id number]   Chief Complaint  Patient presents with   Hypertension    HPI  The patient is here today for a blood pressure f/u.  Hypertension Associated symptoms include headaches. Pertinent negatives include no chest pain, palpitations or shortness of breath.    Past Medical History:  Diagnosis Date   Allergies    Allergy    Arthritis    Asthma    Back pain    Edema of both lower extremities    Food allergy    HLD (hyperlipidemia)    Hypertension    Morbid obesity ([place])    Prediabetes    Sleep apnea    CPAP     Family History  Problem Relation Age of Onset   Hypertension Father    Heart disease Father        stents placed   Cancer Sister    Ovarian cancer Sister        male cancer ? if ovaries   Asthma Son    Colon cancer Neg Hx    Esophageal cancer Neg Hx    Stomach cancer Neg Hx    Rectal cancer Neg Hx      Current Outpatient Medications:    albuterol (VENTOLIN HFA) 108 (90 Base) MCG/ACT inhaler, Inhale 2 puffs into the lungs every 6 (six) hours as needed. wheezing, Disp: 1 each, Rfl: 5   Blood Pressure Monitoring (BLOOD PRESSURE MONITOR/L CUFF) MISC, Use to check blood pressure, Disp: 1 each, Rfl: 0   cetirizine (ZYRTEC) 10 MG tablet, Take 10 mg by mouth daily., Disp: , Rfl:    EPINEPHrine (EPIPEN 2-PAK) 0.3 mg/0.3 mL IJ SOAJ injection, Inject 0.3 mg into the  muscle as needed for anaphylaxis., Disp: 1 each, Rfl: 0   halobetasol (ULTRAVATE) 0.05 % cream, Apply to rash qd -bid ok to fill 3 months at a time, Disp: 50 g, Rfl: 3   hydrochlorothiazide (MICROZIDE) 12.5 MG capsule, Take 1 capsule (12.5 mg total) by mouth 2 (two) times daily., Disp: 180 capsule, Rfl: 1   Magnesium 250 MG TABS, Take 1 tablet (250 mg total) by mouth daily. With evening meal, Disp: 30 tablet, Rfl: 0   metFORMIN (GLUCOPHAGE) 500 MG tablet, TAKE 1 TABLET BY MOUTH  TWICE DAILY WITH MEALS, Disp: 180 tablet, Rfl: 3   montelukast (SINGULAIR) 10 MG tablet, TAKE 1 TABLET BY MOUTH  DAILY, Disp: 30 tablet, Rfl: 0   naproxen (NAPROSYN) 500 MG tablet, Take 1 tablet (500 mg total) by mouth 2 (two) times daily with a meal., Disp: 30 tablet, Rfl: 0   pravastatin (PRAVACHOL) 40 MG tablet, TAKE 1 TABLET BY MOUTH IN  THE EVENING, Disp: 90 tablet, Rfl: 3   Semaglutide-Weight Management (WEGOVY) 0.25 MG/0.5ML SOAJ, Inject 0.25 mg into the skin once a week., Disp: 2 mL, Rfl: 0   Allergies  Allergen Reactions   Food Allergy Formula Shortness Of Breath  and Swelling    Any nuts of any kind.   Peanut-Containing Drug Products Shortness Of Breath, Diarrhea, Nausea And Vomiting and Swelling     Review of Systems  Constitutional: Negative.   Respiratory: Negative.  Negative for shortness of breath.   Cardiovascular: Negative.  Negative for chest pain and palpitations.  Gastrointestinal: Negative.   Neurological:  Positive for headaches.  Psychiatric/Behavioral: Negative.    All other systems reviewed and are negative.   Today's Vitals   12/01/21 0940 12/01/21 1006  BP: (!) 156/90 (!) 142/88  Pulse: 84   Temp: 97.9 F (36.6 C)   Weight: (!) 336 lb (152.4 kg)   Height: 5' 7" (1.702 m)    Body mass index is 52.63 kg/m.  Wt Readings from Last 3 Encounters:  12/01/21 (!) 336 lb (152.4 kg)  06/19/21 (!) 317 lb 3.2 oz (143.9 kg)  06/09/21 (!) 315 lb 9.6 oz (143.2 kg)    BP Readings from Last 3  Encounters:  12/01/21 (!) 142/88  06/19/21 (!) 130/100  06/09/21 138/78    Objective:  Physical Exam Vitals reviewed.  Constitutional:      General: He is not in acute distress.    Appearance: Normal appearance. He is obese.  Cardiovascular:     Rate and Rhythm: Normal rate and regular rhythm.     Pulses: Normal pulses.     Heart sounds: Normal heart sounds. No murmur heard. Pulmonary:     Effort: Pulmonary effort is normal. No respiratory distress.     Breath sounds: Normal breath sounds. No wheezing.  Skin:    General: Skin is warm and dry.     Capillary Refill: Capillary refill takes less than 2 seconds.  Neurological:     General: No focal deficit present.     Mental Status: He is alert and oriented to person, place, and time.     Cranial Nerves: No cranial nerve deficit.     Motor: No weakness.  Psychiatric:        Mood and Affect: Mood normal.        Behavior: Behavior normal.        Thought Content: Thought content normal.        Judgment: Judgment normal.        Assessment And Plan:     1. Uncontrolled hypertension Comments: Repeat blood pressure is better. Advised to get more active and eat a diet low in salt.  - BMP8+EGFR  2. Abnormal glucose Comments: Hgba1c is stable at 6 advised to limit sugar intake and to get more active with walking at least 150 minutes a week. - BMP8+EGFR  3. BMI 50.0-59.9, adult ([place]) Comments: Unable to take phentermine due to blood pressure poorly controlled. Chronic, he is encouraged to initially strive for BMI less than 30 to decrease cardiac risk Discussed healthy diet and regular exercise options  Encouraged to exercise at least 150 minutes per week with 2 days of strength training Will start [facility] pending insurance approval she is to titrate weekly, discussed side effects of nausea, abdominal pain or difficulty swallowing to notify office. [facility] teaching done  Return in 2 months for weight check. - Semaglutide-Weight  Management (WEGOVY) 0.25 MG/0.5ML SOAJ; Inject 0.25 mg into the skin once a week.  Dispense: 2 mL; Refill: 0 .    Patient was given opportunity to ask questions. Patient verbalized understanding of the plan and was able to repeat key elements of the plan. All questions were answered to their satisfaction.  Troy Brine, FNP   I, Troy Brine, FNP, have reviewed all documentation for this visit. The documentation on 12/01/21 for the exam, diagnosis, procedures, and orders are all accurate and complete.   IF YOU HAVE BEEN REFERRED TO A SPECIALIST, IT MAY TAKE 1-2 WEEKS TO SCHEDULE/PROCESS THE REFERRAL. IF YOU HAVE NOT HEARD FROM US/SPECIALIST IN TWO WEEKS, PLEASE GIVE [place] A CALL AT [phone].   THE PATIENT IS ENCOURAGED TO PRACTICE SOCIAL DISTANCING DUE TO THE COVID-19 PANDEMIC.

## 2021-12-02 ENCOUNTER — Other Ambulatory Visit: Payer: Self-pay

## 2021-12-02 MED ORDER — WEGOVY 0.25 MG/0.5ML ~~LOC~~ SOAJ: 0.2500 mg | SUBCUTANEOUS | 0 refills | Status: DC

## 2021-12-02 NOTE — Telephone Encounter (Signed)
Prior auth done for wegovy 0.25mg , the office is out of samples. Waiting on a response from the pt's insurance company.

## 2021-12-11 ENCOUNTER — Other Ambulatory Visit: Payer: Self-pay

## 2021-12-11 MED ORDER — WEGOVY 0.25 MG/0.5ML ~~LOC~~ SOAJ: 0.2500 mg | SUBCUTANEOUS | 0 refills | Status: DC

## 2021-12-29 ENCOUNTER — Other Ambulatory Visit: Payer: Self-pay

## 2021-12-29 ENCOUNTER — Ambulatory Visit: Payer: Managed Care, Other (non HMO) | Admitting: Nurse Practitioner

## 2021-12-29 ENCOUNTER — Encounter: Payer: Self-pay | Admitting: Nurse Practitioner

## 2021-12-29 VITALS — BP 124/80 | HR 97 | Temp 98.3°F | Ht 67.2 in | Wt 323.8 lb

## 2021-12-29 DIAGNOSIS — I10 Essential (primary) hypertension: Secondary | ICD-10-CM | POA: Diagnosis not present

## 2021-12-29 DIAGNOSIS — Z6841 Body Mass Index (BMI) 40.0 and over, adult: Secondary | ICD-10-CM

## 2021-12-29 MED ORDER — WEGOVY 0.5 MG/0.5ML ~~LOC~~ SOAJ: 0.5000 mg | SUBCUTANEOUS | 0 refills | Status: DC

## 2021-12-29 NOTE — Progress Notes (Signed)
?I,Troy Olson,acting as a Neurosurgeon for [employer], FNP.,have documented all relevant documentation on the behalf of Troy Felts, FNP,as directed by  Troy Felts, FNP while in the presence of Troy Felts, FNP.  ? ?This visit occurred during the SARS-CoV-2 public health emergency.  Safety protocols were in place, including screening questions prior to the visit, additional usage of staff PPE, and extensive cleaning of exam room while observing appropriate contact time as indicated for disinfecting solutions. ? ?Subjective:  ?  ? Patient ID: Troy Olson , male    DOB: 12-27-1970 , 51 y.o.   MRN: [id number] ? ? ?Chief Complaint  ?Patient presents with  ? Weight Check  ? ? ?HPI ? ?Patient presents today for a weight check. He reports compliance with his medications. He is tolerating the wegovy okay. He can tell on Tuesday he gets more hungry after taking Ozempic the Wednesday before. He reports after eating at [facility] he ate pancakes with syrup had mild nausea. He did not go to the gym this past week. He had been going prior with 1 hour cardio and some weight lifting.  ? ?Wt Readings from Last 3 Encounters: ?12/29/21 : (!) 323 lb 12.8 oz (146.9 kg) ?12/01/21 : (!) 336 lb (152.4 kg) ?06/19/21 : (!) 317 lb 3.2 oz (143.9 kg) ?  ?  ? ?Past Medical History:  ?Diagnosis Date  ? Allergies   ? Allergy   ? Arthritis   ? Asthma   ? Back pain   ? Edema of both lower extremities   ? Food allergy   ? HLD (hyperlipidemia)   ? Hypertension   ? Morbid obesity (HCC)   ? Prediabetes   ? Sleep apnea   ? CPAP  ?  ? ?Family History  ?Problem Relation Age of Onset  ? Hypertension Father   ? Heart disease Father   ?     stents placed  ? Cancer Sister   ? Ovarian cancer Sister   ?     male cancer ? if ovaries  ? Asthma Son   ? Colon cancer Neg Hx   ? Esophageal cancer Neg Hx   ? Stomach cancer Neg Hx   ? Rectal cancer Neg Hx   ? ? ? ?Current Outpatient Medications:  ?  albuterol (VENTOLIN HFA) 108 (90 Base) MCG/ACT inhaler, Inhale  2 puffs into the lungs every 6 (six) hours as needed. wheezing, Disp: 1 each, Rfl: 5 ?  Blood Pressure Monitoring (BLOOD PRESSURE MONITOR/L CUFF) MISC, Use to check blood pressure, Disp: 1 each, Rfl: 0 ?  cetirizine (ZYRTEC) 10 MG tablet, Take 10 mg by mouth daily., Disp: , Rfl:  ?  EPINEPHrine (EPIPEN 2-PAK) 0.3 mg/0.3 mL IJ SOAJ injection, Inject 0.3 mg into the muscle as needed for anaphylaxis., Disp: 1 each, Rfl: 0 ?  halobetasol (ULTRAVATE) 0.05 % cream, Apply to rash qd -bid ok to fill 3 months at a time, Disp: 50 g, Rfl: 3 ?  hydrochlorothiazide (MICROZIDE) 12.5 MG capsule, Take 1 capsule (12.5 mg total) by mouth 2 (two) times daily., Disp: 180 capsule, Rfl: 1 ?  Magnesium 250 MG TABS, Take 1 tablet (250 mg total) by mouth daily. With evening meal, Disp: 30 tablet, Rfl: 0 ?  metFORMIN (GLUCOPHAGE) 500 MG tablet, TAKE 1 TABLET BY MOUTH  TWICE DAILY WITH MEALS, Disp: 180 tablet, Rfl: 3 ?  montelukast (SINGULAIR) 10 MG tablet, TAKE 1 TABLET BY MOUTH  DAILY, Disp: 30 tablet, Rfl: 0 ?  naproxen (  NAPROSYN) 500 MG tablet, Take 1 tablet (500 mg total) by mouth 2 (two) times daily with a meal., Disp: 30 tablet, Rfl: 0 ?  pravastatin (PRAVACHOL) 40 MG tablet, TAKE 1 TABLET BY MOUTH IN  THE EVENING, Disp: 90 tablet, Rfl: 3 ?  Semaglutide-Weight Management (WEGOVY) 0.5 MG/0.5ML SOAJ, Inject 0.5 mg into the skin once a week., Disp: 2 mL, Rfl: 0  ? ?Allergies  ?Allergen Reactions  ? Food Allergy Formula Shortness Of Breath and Swelling  ?  Any nuts of any kind.  ? Peanut-Containing Drug Products Shortness Of Breath, Diarrhea, Nausea And Vomiting and Swelling  ?  ? ?Review of Systems  ?Constitutional: Negative.   ?Respiratory: Negative.    ?Cardiovascular: Negative.   ?Psychiatric/Behavioral: Negative.     ? ?Today's Vitals  ? 12/29/21 1628  ?BP: 124/80  ?Pulse: 97  ?Temp: 98.3 ?F (36.8 ?C)  ?Weight: (!) 323 lb 12.8 oz (146.9 kg)  ?Height: 5' 7.2" (1.707 m)  ?PainSc: 0-No pain  ? ?Body mass index is 50.41 kg/m?.   ? ?Objective:  ?Physical Exam ?Vitals reviewed.  ?Constitutional:   ?   General: He is not in acute distress. ?   Appearance: Normal appearance. He is obese.  ?Pulmonary:  ?   Effort: Pulmonary effort is normal. No respiratory distress.  ?Neurological:  ?   General: No focal deficit present.  ?   Mental Status: He is alert and oriented to person, place, and time.  ?   Cranial Nerves: No cranial nerve deficit.  ?   Motor: No weakness.  ?Psychiatric:     ?   Mood and Affect: Mood normal.     ?   Behavior: Behavior normal.     ?   Thought Content: Thought content normal.     ?   Judgment: Judgment normal.  ?  ? ?   ?Assessment And Plan:  ?   ?1. Essential hypertension ?Comments: Blood pressure is better controlled, reviewed his blood pressure machine ranged 127/79-154/102, encouraged to keep an actual log. ?- Semaglutide-Weight Management (WEGOVY) 0.5 MG/0.5ML SOAJ; Inject 0.5 mg into the skin once a week.  Dispense: 2 mL; Refill: 0 ? ?2. BMI 50.0-59.9, adult (HCC) ?Comments: Tolerating [name] well will have increased hunger by the 6th day, increasing dose today to 0.5 mg weekly ?- Semaglutide-Weight Management (WEGOVY) 0.5 MG/0.5ML SOAJ; Inject 0.5 mg into the skin once a week.  Dispense: 2 mL; Refill: 0 ?  ? ? ?Patient was given opportunity to ask questions. Patient verbalized understanding of the plan and was able to repeat key elements of the plan. All questions were answered to their satisfaction.  ?Troy Felts, FNP  ? ?I, Troy Felts, FNP, have reviewed all documentation for this visit. The documentation on 12/29/21 for the exam, diagnosis, procedures, and orders are all accurate and complete.  ? ?IF YOU HAVE BEEN REFERRED TO A SPECIALIST, IT MAY TAKE 1-2 WEEKS TO SCHEDULE/PROCESS THE REFERRAL. IF YOU HAVE NOT HEARD FROM US/SPECIALIST IN TWO WEEKS, PLEASE GIVE [place] A CALL AT [phone].  ? ?THE PATIENT IS ENCOURAGED TO PRACTICE SOCIAL DISTANCING DUE TO THE COVID-19 PANDEMIC.   ?

## 2021-12-29 NOTE — Patient Instructions (Signed)

## 2022-01-28 ENCOUNTER — Other Ambulatory Visit: Payer: Self-pay | Admitting: Nurse Practitioner

## 2022-01-28 DIAGNOSIS — Z6841 Body Mass Index (BMI) 40.0 and over, adult: Secondary | ICD-10-CM

## 2022-01-28 DIAGNOSIS — I10 Essential (primary) hypertension: Secondary | ICD-10-CM

## 2022-01-28 MED ORDER — WEGOVY 0.5 MG/0.5ML ~~LOC~~ SOAJ: 0.5000 mg | SUBCUTANEOUS | 0 refills | Status: DC

## 2022-02-07 ENCOUNTER — Other Ambulatory Visit: Payer: Self-pay | Admitting: Nurse Practitioner

## 2022-02-25 ENCOUNTER — Encounter: Payer: Self-pay | Admitting: Nurse Practitioner

## 2022-02-25 ENCOUNTER — Ambulatory Visit: Payer: Managed Care, Other (non HMO) | Admitting: Nurse Practitioner

## 2022-02-25 VITALS — BP 130/74 | HR 90 | Temp 98.1°F | Ht 67.2 in | Wt 311.0 lb

## 2022-02-25 DIAGNOSIS — Z6841 Body Mass Index (BMI) 40.0 and over, adult: Secondary | ICD-10-CM | POA: Diagnosis not present

## 2022-02-25 MED ORDER — WEGOVY 1 MG/0.5ML ~~LOC~~ SOAJ: 1.0000 mg | SUBCUTANEOUS | 0 refills | Status: DC

## 2022-02-25 NOTE — Patient Instructions (Signed)
Obesity, Adult ?Obesity is having too much body fat. Being obese means that your weight is more than what is healthy for you.  ?BMI (body mass index) is a number that explains how much body fat you have. If you have a BMI of 30 or more, you are obese. ?Obesity can cause serious health problems, such as: ?Stroke. ?Coronary artery disease (CAD). ?Type 2 diabetes. ?Some types of cancer. ?High blood pressure (hypertension). ?High cholesterol. ?Gallbladder stones. ?Obesity can also contribute to: ?Osteoarthritis. ?Sleep apnea. ?Infertility problems. ?What are the causes? ?Eating meals each day that are high in calories, sugar, and fat. ?Drinking a lot of drinks that have sugar in them. ?Being born with genes that may make you more likely to become obese. ?Having a medical condition that causes obesity. ?Taking certain medicines. ?Sitting a lot (having a sedentary lifestyle). ?Not getting enough sleep. ?What increases the risk? ?Having a family history of obesity. ?Living in an area with limited access to: ?Parks, recreation centers, or sidewalks. ?Healthy food choices, such as grocery stores and farmers' markets. ?What are the signs or symptoms? ?The main sign is having too much body fat. ?How is this treated? ?Treatment for this condition often includes changing your lifestyle. Treatment may include: ?Changing your diet. This may include making a healthy meal plan. ?Exercise. This may include activity that causes your heart to beat faster (aerobic exercise) and strength training. Work with your doctor to design a program that works for you. ?Medicine to help you lose weight. This may be used if you are not able to lose one pound a week after 6 weeks of healthy eating and more exercise. ?Treating conditions that cause the obesity. ?Surgery. Options may include gastric banding and gastric bypass. This may be done if: ?Other treatments have not helped to improve your condition. ?You have a BMI of 40 or higher. ?You have  life-threatening health problems related to obesity. ?Follow these instructions at home: ?Eating and drinking ? ?Follow advice from your doctor about what to eat and drink. Your doctor may tell you to: ?Limit fast food, sweets, and processed snack foods. ?Choose low-fat options. For example, choose low-fat milk instead of whole milk. ?Eat five or more servings of fruits or vegetables each day. ?Eat at home more often. This gives you more control over what you eat. ?Choose healthy foods when you eat out. ?Learn to read food labels. This will help you learn how much food is in one serving. ?Keep low-fat snacks available. ?Avoid drinks that have a lot of sugar in them. These include soda, fruit juice, iced tea with sugar, and flavored milk. ?Drink enough water to keep your pee (urine) pale yellow. ?Do not go on fad diets. ?Physical activity ?Exercise often, as told by your doctor. Most adults should get up to 150 minutes of moderate-intensity exercise every week.Ask your doctor: ?What types of exercise are safe for you. ?How often you should exercise. ?Warm up and stretch before being active. ?Do slow stretching after being active (cool down). ?Rest between times of being active. ?Lifestyle ?Work with your doctor and a food expert (dietitian) to set a weight-loss goal that is best for you. ?Limit your screen time. ?Find ways to reward yourself that do not involve food. ?Do not drink alcohol if: ?Your doctor tells you not to drink. ?You are pregnant, may be pregnant, or are planning to become pregnant. ?If you drink alcohol: ?Limit how much you have to: ?0-1 drink a day for women. ?0-2 drinks   a day for men. ?Know how much alcohol is in your drink. In the [place] one drink equals one 12 oz bottle of beer (355 mL), one 5 oz glass of wine (148 mL), or one 1? oz glass of hard liquor (44 mL). ?General instructions ?Keep a weight-loss journal. This can help you keep track of: ?The food that you eat. ?How much exercise you  get. ?Take over-the-counter and prescription medicines only as told by your doctor. ?Take vitamins and supplements only as told by your doctor. ?Think about joining a support group. ?Pay attention to your mental health as obesity can lead to depression or self esteem issues. ?Keep all follow-up visits. ?Contact a doctor if: ?You cannot meet your weight-loss goal after you have changed your diet and lifestyle for 6 weeks. ?You are having trouble breathing. ?Summary ?Obesity is having too much body fat. ?Being obese means that your weight is more than what is healthy for you. ?Work with your doctor to set a weight-loss goal. ?Get regular exercise as told by your doctor. ?This information is not intended to replace advice given to you by your health care provider. Make sure you discuss any questions you have with your health care provider. ?Document Revised: 05/06/2021 Document Reviewed: 05/06/2021 ?Elsevier Patient Education ? 2023 Elsevier Inc. ? ?

## 2022-02-25 NOTE — Progress Notes (Signed)
?Clinical biochemist as a Neurosurgeon for [employer], FNP.,have documented all relevant documentation on the behalf of [name], FNP,as directed by  [name], FNP while in the presence of [name], FNP. ? ?This visit occurred during the SARS-CoV-2 public health emergency.  Safety protocols were in place, including screening questions prior to the visit, additional usage of staff PPE, and extensive cleaning of exam room while observing appropriate contact time as indicated for disinfecting solutions. ? ?Subjective:  ?  ? Patient ID: ACESON LABELL , male    DOB: 11-03-1970 , 51 y.o.   MRN: [id number] ? ? ?Chief Complaint  ?Patient presents with  ? Weight Check  ? ? ?HPI ? ?Here for weight check. Continues to take Wegovy 0.5 mg weekly he does notice on Tuesday he gets more hungry. His portion control has decreased significantly. He is not finishing his food. He is planning to start back exercising at [facility] ? ?Wt Readings from Last 3 Encounters: ?02/25/22 : (!) 311 lb (141.1 kg) ?12/29/21 : (!) 323 lb 12.8 oz (146.9 kg) ?12/01/21 : (!) 336 lb (152.4 kg) ? ? ?  ? ?Past Medical History:  ?Diagnosis Date  ? Allergies   ? Allergy   ? Arthritis   ? Asthma   ? Back pain   ? Edema of both lower extremities   ? Food allergy   ? HLD (hyperlipidemia)   ? Hypertension   ? Morbid obesity (HCC)   ? Prediabetes   ? Sleep apnea   ? CPAP  ?  ? ?Family History  ?Problem Relation Age of Onset  ? Hypertension Father   ? Heart disease Father   ?     stents placed  ? Cancer Sister   ? Ovarian cancer Sister   ?     male cancer ? if ovaries  ? Asthma Son   ? Colon cancer Neg Hx   ? Esophageal cancer Neg Hx   ? Stomach cancer Neg Hx   ? Rectal cancer Neg Hx   ? ? ? ?Current Outpatient Medications:  ?  Semaglutide-Weight Management (WEGOVY) 1 MG/0.5ML SOAJ, Inject 1 mg into the skin once a week., Disp: 2 mL, Rfl: 0 ?  albuterol (VENTOLIN HFA) 108 (90 Base) MCG/ACT inhaler, Inhale 2 puffs into the lungs every 6 (six) hours as  needed. wheezing, Disp: 1 each, Rfl: 5 ?  Blood Pressure Monitoring (BLOOD PRESSURE MONITOR/L CUFF) MISC, Use to check blood pressure, Disp: 1 each, Rfl: 0 ?  cetirizine (ZYRTEC) 10 MG tablet, Take 10 mg by mouth daily., Disp: , Rfl:  ?  EPINEPHrine (EPIPEN 2-PAK) 0.3 mg/0.3 mL IJ SOAJ injection, Inject 0.3 mg into the muscle as needed for anaphylaxis., Disp: 1 each, Rfl: 0 ?  halobetasol (ULTRAVATE) 0.05 % cream, Apply to rash qd -bid ok to fill 3 months at a time, Disp: 50 g, Rfl: 3 ?  hydrochlorothiazide (MICROZIDE) 12.5 MG capsule, Take 1 capsule (12.5 mg total) by mouth 2 (two) times daily., Disp: 180 capsule, Rfl: 1 ?  Magnesium 250 MG TABS, Take 1 tablet (250 mg total) by mouth daily. With evening meal, Disp: 30 tablet, Rfl: 0 ?  metFORMIN (GLUCOPHAGE) 500 MG tablet, TAKE 1 TABLET BY MOUTH  TWICE DAILY WITH MEALS, Disp: 180 tablet, Rfl: 3 ?  montelukast (SINGULAIR) 10 MG tablet, TAKE 1 TABLET BY MOUTH DAILY, Disp: 30 tablet, Rfl: 11 ?  naproxen (NAPROSYN) 500 MG tablet, Take 1 tablet (500 mg total) by mouth 2 (two)  times daily with a meal., Disp: 30 tablet, Rfl: 0 ?  pravastatin (PRAVACHOL) 40 MG tablet, TAKE 1 TABLET BY MOUTH IN  THE EVENING, Disp: 90 tablet, Rfl: 3  ? ?Allergies  ?Allergen Reactions  ? Food Allergy Formula Shortness Of Breath and Swelling  ?  Any nuts of any kind.  ? Peanut-Containing Drug Products Shortness Of Breath, Diarrhea, Nausea And Vomiting and Swelling  ?  ? ?Review of Systems  ?Constitutional: Negative.   ?Respiratory: Negative.    ?Cardiovascular: Negative.   ?Psychiatric/Behavioral: Negative.     ? ?Today's Vitals  ? 02/25/22 1157  ?BP: 130/74  ?Pulse: 90  ?Temp: 98.1 ?F (36.7 ?C)  ?TempSrc: Oral  ?Weight: (!) 311 lb (141.1 kg)  ?Height: 5' 7.2" (1.707 m)  ? ?Body mass index is 48.42 kg/m?.  ?Wt Readings from Last 3 Encounters:  ?02/25/22 (!) 311 lb (141.1 kg)  ?12/29/21 (!) 323 lb 12.8 oz (146.9 kg)  ?12/01/21 (!) 336 lb (152.4 kg)  ? ? ?Objective:  ?Physical Exam ?Vitals  reviewed.  ?Constitutional:   ?   General: He is not in acute distress. ?   Appearance: Normal appearance. He is obese.  ?Pulmonary:  ?   Effort: Pulmonary effort is normal. No respiratory distress.  ?Neurological:  ?   General: No focal deficit present.  ?   Mental Status: He is alert and oriented to person, place, and time.  ?   Cranial Nerves: No cranial nerve deficit.  ?   Motor: No weakness.  ?Psychiatric:     ?   Mood and Affect: Mood normal.     ?   Behavior: Behavior normal.     ?   Thought Content: Thought content normal.     ?   Judgment: Judgment normal.  ?  ? ?   ?Assessment And Plan:  ?   ?1. Class 3 severe obesity with body mass index (BMI) of 45.0 to 49.9 in adult, unspecified obesity type, unspecified whether serious comorbidity present (HCC) ?Comments: He has lost 12 lbs since last visit. Advised him it is okay to exercise with cardio and light weights since he has a hernia ?He is encouraged to initially strive for BMI less than 30 to decrease cardiac risk. He is advised to exercise no less than 150 minutes per week.  ? ? ? ?Patient was given opportunity to ask questions. Patient verbalized understanding of the plan and was able to repeat key elements of the plan. All questions were answered to their satisfaction.  ?[name], FNP  ? ?I, [name], FNP, have reviewed all documentation for this visit. The documentation on 02/25/22 for the exam, diagnosis, procedures, and orders are all accurate and complete.  ? ?IF YOU HAVE BEEN REFERRED TO A SPECIALIST, IT MAY TAKE 1-2 WEEKS TO SCHEDULE/PROCESS THE REFERRAL. IF YOU HAVE NOT HEARD FROM US/SPECIALIST IN TWO WEEKS, PLEASE GIVE [place] A CALL AT [phone].  ? ?THE PATIENT IS ENCOURAGED TO PRACTICE SOCIAL DISTANCING DUE TO THE COVID-19 PANDEMIC.   ?

## 2022-03-05 ENCOUNTER — Encounter: Payer: Self-pay | Admitting: Nurse Practitioner

## 2022-03-06 ENCOUNTER — Other Ambulatory Visit: Payer: Self-pay

## 2022-03-06 MED ORDER — WEGOVY 0.5 MG/0.5ML ~~LOC~~ SOAJ
0.5000 mg | SUBCUTANEOUS | 0 refills | Status: DC
Start: 2022-03-06 — End: 2022-06-24

## 2022-03-20 ENCOUNTER — Other Ambulatory Visit: Payer: Self-pay

## 2022-03-20 MED ORDER — WEGOVY 1 MG/0.5ML ~~LOC~~ SOAJ: 1.0000 mg | SUBCUTANEOUS | 0 refills | Status: DC

## 2022-03-23 ENCOUNTER — Other Ambulatory Visit: Payer: Self-pay

## 2022-03-23 MED ORDER — WEGOVY 1 MG/0.5ML ~~LOC~~ SOAJ: 1.0000 mg | SUBCUTANEOUS | 0 refills | Status: DC

## 2022-04-16 ENCOUNTER — Other Ambulatory Visit: Payer: Self-pay | Admitting: Nurse Practitioner

## 2022-04-16 DIAGNOSIS — I10 Essential (primary) hypertension: Secondary | ICD-10-CM

## 2022-05-18 ENCOUNTER — Encounter: Payer: Managed Care, Other (non HMO) | Admitting: Nurse Practitioner

## 2022-05-20 ENCOUNTER — Encounter (INDEPENDENT_AMBULATORY_CARE_PROVIDER_SITE_OTHER): Payer: Self-pay

## 2022-05-27 ENCOUNTER — Ambulatory Visit (INDEPENDENT_AMBULATORY_CARE_PROVIDER_SITE_OTHER): Payer: Managed Care, Other (non HMO) | Admitting: Nurse Practitioner

## 2022-05-27 ENCOUNTER — Encounter: Payer: Self-pay | Admitting: Nurse Practitioner

## 2022-05-27 VITALS — BP 138/88 | HR 98 | Temp 97.9°F | Ht 67.0 in | Wt 321.8 lb

## 2022-05-27 DIAGNOSIS — L80 Vitiligo: Secondary | ICD-10-CM

## 2022-05-27 DIAGNOSIS — Z0000 Encounter for general adult medical examination without abnormal findings: Secondary | ICD-10-CM | POA: Diagnosis not present

## 2022-05-27 DIAGNOSIS — I10 Essential (primary) hypertension: Secondary | ICD-10-CM

## 2022-05-27 DIAGNOSIS — L308 Other specified dermatitis: Secondary | ICD-10-CM

## 2022-05-27 DIAGNOSIS — R7303 Prediabetes: Secondary | ICD-10-CM | POA: Diagnosis not present

## 2022-05-27 DIAGNOSIS — Z6841 Body Mass Index (BMI) 40.0 and over, adult: Secondary | ICD-10-CM

## 2022-05-27 DIAGNOSIS — Z125 Encounter for screening for malignant neoplasm of prostate: Secondary | ICD-10-CM

## 2022-05-27 DIAGNOSIS — Z79899 Other long term (current) drug therapy: Secondary | ICD-10-CM

## 2022-05-27 LAB — POCT URINALYSIS DIPSTICK
Bilirubin, UA: NEGATIVE
Glucose, UA: NEGATIVE
Ketones, UA: NEGATIVE
Leukocytes, UA: NEGATIVE
Nitrite, UA: NEGATIVE
Protein, UA: NEGATIVE
Spec Grav, UA: 1.015 (ref 1.010–1.025)
Urobilinogen, UA: 0.2 E.U./dL
pH, UA: 6 (ref 5.0–8.0)

## 2022-05-27 MED ORDER — CLOBETASOL PROPIONATE 0.05 % EX CREA: 1.0000 | TOPICAL_CREAM | Freq: Two times a day (BID) | CUTANEOUS | 3 refills | Status: AC

## 2022-05-27 NOTE — Patient Instructions (Signed)
Health Maintenance, Male Adopting a healthy lifestyle and getting preventive care are important in promoting health and wellness. Ask your health care provider about: The right schedule for you to have regular tests and exams. Things you can do on your own to prevent diseases and keep yourself healthy. What should I know about diet, weight, and exercise? Eat a healthy diet  Eat a diet that includes plenty of vegetables, fruits, low-fat dairy products, and lean protein. Do not eat a lot of foods that are high in solid fats, added sugars, or sodium. Maintain a healthy weight Body mass index (BMI) is a measurement that can be used to identify possible weight problems. It estimates body fat based on height and weight. Your health care provider can help determine your BMI and help you achieve or maintain a healthy weight. Get regular exercise Get regular exercise. This is one of the most important things you can do for your health. Most adults should: Exercise for at least 150 minutes each week. The exercise should increase your heart rate and make you sweat (moderate-intensity exercise). Do strengthening exercises at least twice a week. This is in addition to the moderate-intensity exercise. Spend less time sitting. Even light physical activity can be beneficial. Watch cholesterol and blood lipids Have your blood tested for lipids and cholesterol at 49 years of age, then have this test every 5 years. You may need to have your cholesterol levels checked more often if: Your lipid or cholesterol levels are high. You are older than 49 years of age. You are at high risk for heart disease. What should I know about cancer screening? Many types of cancers can be detected early and may often be prevented. Depending on your health history and family history, you may need to have cancer screening at various ages. This may include screening for: Colorectal cancer. Prostate cancer. Skin cancer. Lung  cancer. What should I know about heart disease, diabetes, and high blood pressure? Blood pressure and heart disease High blood pressure causes heart disease and increases the risk of stroke. This is more likely to develop in people who have high blood pressure readings or are overweight. Talk with your health care provider about your target blood pressure readings. Have your blood pressure checked: Every 3-5 years if you are 18-39 years of age. Every year if you are 40 years old or older. If you are between the ages of 65 and 75 and are a current or former smoker, ask your health care provider if you should have a one-time screening for abdominal aortic aneurysm (AAA). Diabetes Have regular diabetes screenings. This checks your fasting blood sugar level. Have the screening done: Once every three years after age 45 if you are at a normal weight and have a low risk for diabetes. More often and at a younger age if you are overweight or have a high risk for diabetes. What should I know about preventing infection? Hepatitis B If you have a higher risk for hepatitis B, you should be screened for this virus. Talk with your health care provider to find out if you are at risk for hepatitis B infection. Hepatitis C Blood testing is recommended for: Everyone born from 1945 through 1965. Anyone with known risk factors for hepatitis C. Sexually transmitted infections (STIs) You should be screened each year for STIs, including gonorrhea and chlamydia, if: You are sexually active and are younger than 49 years of age. You are older than 49 years of age and your   health care provider tells you that you are at risk for this type of infection. Your sexual activity has changed since you were last screened, and you are at increased risk for chlamydia or gonorrhea. Ask your health care provider if you are at risk. Ask your health care provider about whether you are at high risk for HIV. Your health care provider  may recommend a prescription medicine to help prevent HIV infection. If you choose to take medicine to prevent HIV, you should first get tested for HIV. You should then be tested every 3 months for as long as you are taking the medicine. Follow these instructions at home: Alcohol use Do not drink alcohol if your health care provider tells you not to drink. If you drink alcohol: Limit how much you have to 0-2 drinks a day. Know how much alcohol is in your drink. In the [place] one drink equals one 12 oz bottle of beer (355 mL), one 5 oz glass of wine (148 mL), or one 1 oz glass of hard liquor (44 mL). Lifestyle Do not use any products that contain nicotine or tobacco. These products include cigarettes, chewing tobacco, and vaping devices, such as e-cigarettes. If you need help quitting, ask your health care provider. Do not use street drugs. Do not share needles. Ask your health care provider for help if you need support or information about quitting drugs. General instructions Schedule regular health, dental, and eye exams. Stay current with your vaccines. Tell your health care provider if: You often feel depressed. You have ever been abused or do not feel safe at home. Summary Adopting a healthy lifestyle and getting preventive care are important in promoting health and wellness. Follow your health care provider's instructions about healthy diet, exercising, and getting tested or screened for diseases. Follow your health care provider's instructions on monitoring your cholesterol and blood pressure. This information is not intended to replace advice given to you by your health care provider. Make sure you discuss any questions you have with your health care provider. Document Revised: 02/17/2021 Document Reviewed: 02/17/2021 Elsevier Patient Education  2023 Elsevier Inc.  

## 2022-05-27 NOTE — Progress Notes (Signed)
I,[name],acting as a Education administrator for [employer], FNP.,have documented all relevant documentation on the behalf of [name], FNP,as directed by  [name], FNP while in the presence of [name], [place].  Subjective:     Patient ID: Troy Olson , male    DOB: 23-Oct-1970 , 51 y.o.   MRN: [id number]   Chief Complaint  Patient presents with   Annual Exam    HPI  Patient presents today for HM, Patient states compliance with all medications. Patient states he meeds more eczema cream, patient states he hasnt had his wegovy and has gained all his weight back.   BP Readings from Last 3 Encounters: 05/27/22 : (!) 140/70 02/25/22 : 130/74 12/29/21 : 124/80  Wt Readings from Last 3 Encounters: 05/27/22 : (!) 321 lb 12.8 oz (146 kg) 02/25/22 : (!) 311 lb (141.1 kg) 12/29/21 : (!) 323 lb 12.8 oz (146.9 kg)    Hypertension This is a chronic problem. The current episode started more than 1 year ago. The problem is controlled. Pertinent negatives include no anxiety or palpitations. There are no associated agents to hypertension. Risk factors for coronary artery disease include obesity, male gender, dyslipidemia and sedentary lifestyle. There are no compliance problems.  There is no history of angina or kidney disease.     Past Medical History:  Diagnosis Date   Allergies    Allergy    Arthritis    Asthma    Back pain    Edema of both lower extremities    Food allergy    HLD (hyperlipidemia)    Hypertension    Morbid obesity ([place])    Prediabetes    Sleep apnea    CPAP     Family History  Problem Relation Age of Onset   Hypertension Father    Heart disease Father        stents placed   Cancer Sister    Ovarian cancer Sister        male cancer ? if ovaries   Asthma Son    Colon cancer Neg Hx    Esophageal cancer Neg Hx    Stomach cancer Neg Hx    Rectal cancer Neg Hx      Current Outpatient Medications:    albuterol (VENTOLIN HFA) 108 (90 Base) MCG/ACT  inhaler, Inhale 2 puffs into the lungs every 6 (six) hours as needed. wheezing, Disp: 1 each, Rfl: 5   Blood Pressure Monitoring (BLOOD PRESSURE MONITOR/L CUFF) MISC, Use to check blood pressure, Disp: 1 each, Rfl: 0   cetirizine (ZYRTEC) 10 MG tablet, Take 10 mg by mouth daily., Disp: , Rfl:    clobetasol cream (TEMOVATE) 7.68 %, Apply 1 Application topically 2 (two) times daily., Disp: 60 g, Rfl: 3   EPINEPHrine (EPIPEN 2-PAK) 0.3 mg/0.3 mL IJ SOAJ injection, Inject 0.3 mg into the muscle as needed for anaphylaxis., Disp: 1 each, Rfl: 0   hydrochlorothiazide (MICROZIDE) 12.5 MG capsule, TAKE 1 CAPSULE BY MOUTH TWICE  DAILY, Disp: 180 capsule, Rfl: 3   Magnesium 250 MG TABS, Take 1 tablet (250 mg total) by mouth daily. With evening meal, Disp: 30 tablet, Rfl: 0   metFORMIN (GLUCOPHAGE) 500 MG tablet, TAKE 1 TABLET BY MOUTH  TWICE DAILY WITH MEALS, Disp: 180 tablet, Rfl: 3   montelukast (SINGULAIR) 10 MG tablet, TAKE 1 TABLET BY MOUTH DAILY, Disp: 30 tablet, Rfl: 11   naproxen (NAPROSYN) 500 MG tablet, Take 1 tablet (500 mg total) by mouth 2 (two)  times daily with a meal., Disp: 30 tablet, Rfl: 0   pravastatin (PRAVACHOL) 40 MG tablet, TAKE 1 TABLET BY MOUTH IN  THE EVENING, Disp: 90 tablet, Rfl: 3   Semaglutide-Weight Management (WEGOVY) 0.5 MG/0.5ML SOAJ, Inject 0.5 mg into the skin once a week. (Patient not taking: Reported on 05/27/2022), Disp: 2 mL, Rfl: 0   Semaglutide-Weight Management (WEGOVY) 1 MG/0.5ML SOAJ, Inject 1 mg into the skin once a week., Disp: 2 mL, Rfl: 0   Allergies  Allergen Reactions   Food Allergy Formula Shortness Of Breath and Swelling    Any nuts of any kind.   Peanut-Containing Drug Products Shortness Of Breath, Diarrhea, Nausea And Vomiting and Swelling     Men's preventive visit. Patient Health Questionnaire (PHQ-2) is  [name] Visit from 05/27/2022 in [facility]  PHQ-2 Total Score 0      Patient is on a Regular diet; he has  been keeping his sweet craving better controlled since not being on the [place], he has recently gone to [place] and celebrated a birthday. He has just started back walking in the last week and trying to be more active. Marital status: Married. Relevant history for alcohol use is:  Social History   Substance and Sexual Activity  Alcohol Use Yes   Comment: occasionally--dark liquer 4-5 drinks consumed weekly   Relevant history for tobacco use is:  Social History   Tobacco Use  Smoking Status Some Days   Packs/day: 0.50   Years: 20.00   Total pack years: 10.00   Types: Cigars, Cigarettes   Last attempt to quit: 03/13/2011   Years since quitting: 11.2  Smokeless Tobacco Never  Tobacco Comments   Smokes cigars occasionally.  .   Review of Systems  Constitutional: Negative.   HENT: Negative.    Eyes: Negative.   Respiratory: Negative.    Cardiovascular: Negative.  Negative for palpitations.  Gastrointestinal: Negative.   Endocrine: Negative.   Genitourinary: Negative.   Musculoskeletal: Negative.   Skin:  Positive for rash (bilateral arms with dry scaly rash).  Allergic/Immunologic: Negative.   Neurological: Negative.   Hematological: Negative.   Psychiatric/Behavioral: Negative.       Today's Vitals   05/27/22 0901 05/27/22 1044  BP: (!) 140/70 138/88  Pulse: 98   Temp: 97.9 F (36.6 C)   TempSrc: Oral   SpO2: 98%   Weight: (!) 321 lb 12.8 oz (146 kg)   Height: 5' 7"  (1.702 m)   PainSc: 0-No pain    Body mass index is 50.4 kg/m.  Wt Readings from Last 3 Encounters:  05/27/22 (!) 321 lb 12.8 oz (146 kg)  02/25/22 (!) 311 lb (141.1 kg)  12/29/21 (!) 323 lb 12.8 oz (146.9 kg)     Objective:  Physical Exam Vitals reviewed.  Constitutional:      Appearance: Normal appearance. He is obese.  HENT:     Head: Normocephalic and atraumatic.     Right Ear: Tympanic membrane, ear canal and external ear normal. There is no impacted cerumen.     Left Ear: Tympanic  membrane, ear canal and external ear normal. There is no impacted cerumen.     Nose:     Comments: Deferred - masked    Mouth/Throat:     Comments: Deferred - masked Eyes:     Pupils: Pupils are equal, round, and reactive to light.  Cardiovascular:     Rate and Rhythm: Normal rate and regular rhythm.  Pulses: Normal pulses.     Heart sounds: Normal heart sounds. No murmur heard. Pulmonary:     Effort: Pulmonary effort is normal. No respiratory distress.     Breath sounds: Normal breath sounds. No wheezing.  Abdominal:     General: Abdomen is flat. Bowel sounds are normal. There is no distension.     Palpations: Abdomen is soft.     Tenderness: There is no abdominal tenderness.  Genitourinary:    Prostate: Normal.     Rectum: Guaiac result negative.  Musculoskeletal:        General: Normal range of motion.     Cervical back: Normal range of motion and neck supple.  Skin:    General: Skin is warm.     Capillary Refill: Capillary refill takes less than 2 seconds.     Comments: Hypopigmentation to his lower abdomen at the panus area where his belt buckle sits.   Neurological:     General: No focal deficit present.     Mental Status: He is alert and oriented to person, place, and time.     Cranial Nerves: No cranial nerve deficit.     Motor: No weakness.  Psychiatric:        Mood and Affect: Mood normal.        Behavior: Behavior normal.        Thought Content: Thought content normal.        Judgment: Judgment normal.         Assessment And Plan:    1. Annual physical exam Behavior modifications discussed and diet history reviewed.   Pt will continue to exercise regularly and modify diet with low GI, plant based foods and decrease intake of processed foods.  Recommend intake of daily multivitamin, Vitamin D, and calcium.  Recommend colonoscopy for preventive screenings, as well as recommend immunizations that include influenza, TDAP  2. Morbid obesity with BMI of  50.0-59.9, adult [facility]) Comments: Unfortunately his weight has increased since he has been unable to get the [facility] due to national back order, will resend to see if now available. Encouraged to increase physical activity to at least 150 minutes  3. Encounter for prostate cancer screening - PSA  4. Essential hypertension Comments: Blood pressure is fairly controlled, continue current medications.  - EKG 12-Lead - POCT Urinalysis Dipstick (81002) - Microalbumin / Creatinine Urine Ratio  5. Prediabetes Comments: Stable, continue focusing on healthy diet low in sugar and starches.  - Hemoglobin A1c - CMP14+EGFR - Lipid panel  6. Other eczema Comments: Rx sent for Clobetasol - clobetasol cream (TEMOVATE) 0.05 %; Apply 1 Application topically 2 (two) times daily.  Dispense: 60 g; Refill: 3  7. Vitiligo Comments: Present to lower abdomen and rectal area. Has seen Dermatology Dr Prince [place]  8. Other long term (current) drug therapy - CBC   Patient was given opportunity to ask questions. Patient verbalized understanding of the plan and was able to repeat key elements of the plan. All questions were answered to their satisfaction.   [name], FNP   I, [name], FNP, have reviewed all documentation for this visit. The documentation on 05/27/22 for the exam, diagnosis, procedures, and orders are all accurate and complete.   THE PATIENT IS ENCOURAGED TO PRACTICE SOCIAL DISTANCING DUE TO THE COVID-19 PANDEMIC.

## 2022-05-28 LAB — CBC
Hematocrit: 43 % (ref 37.5–51.0)
Hemoglobin: 14.2 g/dL (ref 13.0–17.7)
MCH: 27.4 pg (ref 26.6–33.0)
MCHC: 33 g/dL (ref 31.5–35.7)
MCV: 83 fL (ref 79–97)
Platelets: 232 10*3/uL (ref 150–450)
RBC: 5.18 x10E6/uL (ref 4.14–5.80)
RDW: 13.8 % (ref 11.6–15.4)
WBC: 3.6 10*3/uL (ref 3.4–10.8)

## 2022-05-28 LAB — CMP14+EGFR
ALT: 17 IU/L (ref 0–44)
AST: 19 IU/L (ref 0–40)
Albumin/Globulin Ratio: 1.4 (ref 1.2–2.2)
Albumin: 4.4 g/dL (ref 4.1–5.1)
Alkaline Phosphatase: 63 IU/L (ref 44–121)
BUN/Creatinine Ratio: 9 (ref 9–20)
BUN: 9 mg/dL (ref 6–24)
Bilirubin Total: 0.3 mg/dL (ref 0.0–1.2)
CO2: 24 mmol/L (ref 20–29)
Calcium: 9.4 mg/dL (ref 8.7–10.2)
Chloride: 100 mmol/L (ref 96–106)
Creatinine, Ser: 1.03 mg/dL (ref 0.76–1.27)
Globulin, Total: 3.1 g/dL (ref 1.5–4.5)
Glucose: 85 mg/dL (ref 70–99)
Potassium: 4.3 mmol/L (ref 3.5–5.2)
Sodium: 139 mmol/L (ref 134–144)
Total Protein: 7.5 g/dL (ref 6.0–8.5)
eGFR: 89 mL/min/{1.73_m2} (ref >59)

## 2022-05-28 LAB — PSA: Prostate Specific Ag, Serum: 0.4 ng/mL (ref 0.0–4.0)

## 2022-05-28 LAB — LIPID PANEL
Chol/HDL Ratio: 3.6 ratio (ref 0.0–5.0)
Cholesterol, Total: 200 mg/dL — ABNORMAL HIGH (ref 100–199)
HDL: 56 mg/dL (ref >39)
LDL Chol Calc (NIH): 128 mg/dL — ABNORMAL HIGH (ref 0–99)
Triglycerides: 90 mg/dL (ref 0–149)
VLDL Cholesterol Cal: 16 mg/dL (ref 5–40)

## 2022-05-28 LAB — MICROALBUMIN / CREATININE URINE RATIO
Creatinine, Urine: 39.6 mg/dL
Microalb/Creat Ratio: <8 mg/g creat (ref 0–29)
Microalbumin, Urine: <3 ug/mL

## 2022-05-28 LAB — HEMOGLOBIN A1C
Est. average glucose Bld gHb Est-mCnc: 123 mg/dL
Hgb A1c MFr Bld: 5.9 % — ABNORMAL HIGH (ref 4.8–5.6)

## 2022-06-24 ENCOUNTER — Other Ambulatory Visit: Payer: Self-pay | Admitting: Nurse Practitioner

## 2022-06-24 MED ORDER — WEGOVY 0.5 MG/0.5ML ~~LOC~~ SOAJ: 0.5000 mg | SUBCUTANEOUS | 0 refills | Status: DC

## 2022-07-08 ENCOUNTER — Encounter: Payer: Self-pay | Admitting: Nurse Practitioner

## 2022-07-20 ENCOUNTER — Other Ambulatory Visit: Payer: Self-pay | Admitting: Nurse Practitioner

## 2022-08-16 ENCOUNTER — Other Ambulatory Visit: Payer: Self-pay | Admitting: Nurse Practitioner

## 2022-08-31 ENCOUNTER — Other Ambulatory Visit (HOSPITAL_COMMUNITY): Payer: Self-pay

## 2022-08-31 ENCOUNTER — Other Ambulatory Visit: Payer: Self-pay | Admitting: Nurse Practitioner

## 2022-08-31 MED ORDER — WEGOVY 0.5 MG/0.5ML ~~LOC~~ SOAJ
0.5000 mg | SUBCUTANEOUS | 0 refills | Status: DC
  Filled 2022-08-31: qty 2, fill #0

## 2022-09-07 ENCOUNTER — Encounter: Payer: Self-pay | Admitting: Nurse Practitioner

## 2022-09-23 ENCOUNTER — Encounter: Payer: Self-pay | Admitting: Nurse Practitioner

## 2022-09-23 ENCOUNTER — Ambulatory Visit: Payer: Managed Care, Other (non HMO) | Admitting: Nurse Practitioner

## 2022-09-23 VITALS — BP 130/76 | HR 86 | Temp 97.7°F | Ht 67.0 in | Wt 337.0 lb

## 2022-09-23 DIAGNOSIS — Z23 Encounter for immunization: Secondary | ICD-10-CM | POA: Diagnosis not present

## 2022-09-23 DIAGNOSIS — G4733 Obstructive sleep apnea (adult) (pediatric): Secondary | ICD-10-CM

## 2022-09-23 DIAGNOSIS — I10 Essential (primary) hypertension: Secondary | ICD-10-CM

## 2022-09-23 DIAGNOSIS — Z6841 Body Mass Index (BMI) 40.0 and over, adult: Secondary | ICD-10-CM

## 2022-09-23 DIAGNOSIS — R7303 Prediabetes: Secondary | ICD-10-CM

## 2022-09-23 NOTE — Progress Notes (Signed)
I,[name],acting as a Education administrator for [employer], FNP.,have documented all relevant documentation on the behalf of [name], FNP,as directed by  [name], FNP while in the presence of [name], [place].  Subjective:     Patient ID: Troy Olson , male    DOB: 18-Jun-1971 , 51 y.o.   MRN: [id number]   Chief Complaint  Patient presents with   Hypertension    HPI  Patient here for a f/u on his blood pressure and prediabetes with medication follow up.  Wt Readings from Last 3 Encounters: 09/23/22 : (!) 337 lb (152.9 kg) 05/27/22 : (!) 321 lb 12.8 oz (146 kg) 02/25/22 : (!) 311 lb (141.1 kg)  He is doing more driving than usual. He has been to healthy weight and wellness a few years ago    Hypertension This is a chronic problem. The current episode started more than 1 year ago. The problem is controlled. Pertinent negatives include no anxiety, chest pain, headaches, palpitations or shortness of breath. There are no associated agents to hypertension. Risk factors for coronary artery disease include obesity and sedentary lifestyle. Past treatments include diuretics. There are no compliance problems.  There is no history of angina or kidney disease. There is no history of chronic renal disease.     Past Medical History:  Diagnosis Date   Allergies    Allergy    Arthritis    Asthma    Back pain    Edema of both lower extremities    Food allergy    HLD (hyperlipidemia)    Hypertension    Morbid obesity ([place])    Prediabetes    Sleep apnea    CPAP     Family History  Problem Relation Age of Onset   Hypertension Father    Heart disease Father        stents placed   Cancer Sister    Ovarian cancer Sister        male cancer ? if ovaries   Asthma Son    Colon cancer Neg Hx    Esophageal cancer Neg Hx    Stomach cancer Neg Hx    Rectal cancer Neg Hx      Current Outpatient Medications:    albuterol (VENTOLIN HFA) 108 (90 Base) MCG/ACT inhaler, Inhale 2 puffs  into the lungs every 6 (six) hours as needed. wheezing, Disp: 1 each, Rfl: 5   Blood Pressure Monitoring (BLOOD PRESSURE MONITOR/L CUFF) MISC, Use to check blood pressure, Disp: 1 each, Rfl: 0   cetirizine (ZYRTEC) 10 MG tablet, Take 10 mg by mouth daily., Disp: , Rfl:    clobetasol cream (TEMOVATE) 3.76 %, Apply 1 Application topically 2 (two) times daily., Disp: 60 g, Rfl: 3   EPINEPHrine (EPIPEN 2-PAK) 0.3 mg/0.3 mL IJ SOAJ injection, Inject 0.3 mg into the muscle as needed for anaphylaxis., Disp: 1 each, Rfl: 0   hydrochlorothiazide (MICROZIDE) 12.5 MG capsule, TAKE 1 CAPSULE BY MOUTH TWICE  DAILY, Disp: 180 capsule, Rfl: 3   Magnesium 250 MG TABS, Take 1 tablet (250 mg total) by mouth daily. With evening meal, Disp: 30 tablet, Rfl: 0   metFORMIN (GLUCOPHAGE) 500 MG tablet, TAKE 1 TABLET BY MOUTH  TWICE DAILY WITH MEALS, Disp: 180 tablet, Rfl: 3   montelukast (SINGULAIR) 10 MG tablet, TAKE 1 TABLET BY MOUTH DAILY, Disp: 30 tablet, Rfl: 11   naproxen (NAPROSYN) 500 MG tablet, Take 1 tablet (500 mg total) by mouth 2 (two) times daily with  a meal., Disp: 30 tablet, Rfl: 0   pravastatin (PRAVACHOL) 40 MG tablet, TAKE 1 TABLET BY MOUTH IN THE  EVENING, Disp: 90 tablet, Rfl: 3   Allergies  Allergen Reactions   Food Allergy Formula Shortness Of Breath and Swelling    Any nuts of any kind.   Peanut-Containing Drug Products Shortness Of Breath, Diarrhea, Nausea And Vomiting and Swelling     Review of Systems  Constitutional: Negative.   HENT: Negative.    Eyes: Negative.   Respiratory: Negative.  Negative for shortness of breath.   Cardiovascular: Negative.  Negative for chest pain and palpitations.  Gastrointestinal: Negative.   Endocrine: Negative.   Genitourinary: Negative.   Musculoskeletal: Negative.   Skin: Negative.   Allergic/Immunologic: Negative.   Neurological: Negative.  Negative for headaches.  Hematological: Negative.   Psychiatric/Behavioral: Negative.       Today's  Vitals   09/23/22 0816  BP: 130/76  Pulse: 86  Temp: 97.7 F (36.5 C)  TempSrc: Oral  Weight: (!) 337 lb (152.9 kg)  Height: _0  (1.702 m)   Body mass index is 52.78 kg/m.  Wt Readings from Last 3 Encounters:  09/23/22 (!) 337 lb (152.9 kg)  05/27/22 (!) 321 lb 12.8 oz (146 kg)  02/25/22 (!) 311 lb (141.1 kg)    Objective:  Physical Exam Vitals reviewed.  Constitutional:      General: He is not in acute distress.    Appearance: Normal appearance. He is obese.  Pulmonary:     Effort: Pulmonary effort is normal. No respiratory distress.  Neurological:     General: No focal deficit present.     Mental Status: He is alert and oriented to person, place, and time.     Cranial Nerves: No cranial nerve deficit.     Motor: No weakness.  Psychiatric:        Mood and Affect: Mood normal.        Behavior: Behavior normal.        Thought Content: Thought content normal.        Judgment: Judgment normal.         Assessment And Plan:     1. Essential hypertension Comments: Blood pressure is controlled, continue current medications and advised to focus on weight loss with additional lifestyle modifications - BMP8+EGFR  2. Prediabetes Comments: Hemoglobin A1c slightly increased at last visit continue focusing on healthy diet low in sugar and carbs.  Encouraged to exercise to 150 minutes a week - Hemoglobin A1c  3. Need for influenza vaccination Influenza vaccine administered Encouraged to take Tylenol as needed for fever or muscle aches. - Flu Vaccine QUAD 6+ mos PF IM (Fluarix Quad PF)  4. OSA on CPAP Encouraged to wear CPAP nightly can help to improve quality of life.  5. Morbid obesity with BMI of 50.0-59.9, adult [facility]) Comments: He has not been able to get [facility] due to backorder will refer to to weight management clinic.  He is encouraged to initially strive for BMI less than 30 to decrease cardiac risk. He is advised to exercise no less than 150 minutes per week.  -  Amb Ref to Medical Weight Management     Patient was given opportunity to ask questions. Patient verbalized understanding of the plan and was able to repeat key elements of the plan. All questions were answered to their satisfaction.  [name], FNP   I, [name], FNP, have reviewed all documentation for this visit. The documentation on 09/23/22  for the exam, diagnosis, procedures, and orders are all accurate and complete.    IF YOU HAVE BEEN REFERRED TO A SPECIALIST, IT MAY TAKE 1-2 WEEKS TO SCHEDULE/PROCESS THE REFERRAL. IF YOU HAVE NOT HEARD FROM US/SPECIALIST IN TWO WEEKS, PLEASE GIVE [place] A CALL AT [phone].   THE PATIENT IS ENCOURAGED TO PRACTICE SOCIAL DISTANCING DUE TO THE COVID-19 PANDEMIC.

## 2022-09-23 NOTE — Patient Instructions (Addendum)
Hypertension, Adult High blood pressure (hypertension) is when the force of blood pumping through the arteries is too strong. The arteries are the blood vessels that carry blood from the heart throughout the body. Hypertension forces the heart to work harder to pump blood and may cause arteries to become narrow or stiff. Untreated or uncontrolled hypertension can lead to a heart attack, heart failure, a stroke, kidney disease, and other problems. A blood pressure reading consists of a higher number over a lower number. Ideally, your blood pressure should be below 120/80. The first ("top") number is called the systolic pressure. It is a measure of the pressure in your arteries as your heart beats. The second ("bottom") number is called the diastolic pressure. It is a measure of the pressure in your arteries as the heart relaxes. What are the causes? The exact cause of this condition is not known. There are some conditions that result in high blood pressure. What increases the risk? Certain factors may make you more likely to develop high blood pressure. Some of these risk factors are under your control, including: Smoking. Not getting enough exercise or physical activity. Being overweight. Having too much fat, sugar, calories, or salt (sodium) in your diet. Drinking too much alcohol. Other risk factors include: Having a personal history of heart disease, diabetes, high cholesterol, or kidney disease. Stress. Having a family history of high blood pressure and high cholesterol. Having obstructive sleep apnea. Age. The risk increases with age. What are the signs or symptoms? High blood pressure may not cause symptoms. Very high blood pressure (hypertensive crisis) may cause: Headache. Fast or irregular heartbeats (palpitations). Shortness of breath. Nosebleed. Nausea and vomiting. Vision changes. Severe chest pain, dizziness, and seizures. How is this diagnosed? This condition is diagnosed by  measuring your blood pressure while you are seated, with your arm resting on a flat surface, your legs uncrossed, and your feet flat on the floor. The cuff of the blood pressure monitor will be placed directly against the skin of your upper arm at the level of your heart. Blood pressure should be measured at least twice using the same arm. Certain conditions can cause a difference in blood pressure between your right and left arms. If you have a high blood pressure reading during one visit or you have normal blood pressure with other risk factors, you may be asked to: Return on a different day to have your blood pressure checked again. Monitor your blood pressure at home for 1 week or longer. If you are diagnosed with hypertension, you may have other blood or imaging tests to help your health care provider understand your overall risk for other conditions. How is this treated? This condition is treated by making healthy lifestyle changes, such as eating healthy foods, exercising more, and reducing your alcohol intake. You may be referred for counseling on a healthy diet and physical activity. Your health care provider may prescribe medicine if lifestyle changes are not enough to get your blood pressure under control and if: Your systolic blood pressure is above 130. Your diastolic blood pressure is above 80. Your personal target blood pressure may vary depending on your medical conditions, your age, and other factors. Follow these instructions at home: Eating and drinking  Eat a diet that is high in fiber and potassium, and low in sodium, added sugar, and fat. An example of this eating plan is called the DASH diet. DASH stands for Dietary Approaches to Stop Hypertension. To eat this way: Eat   plenty of fresh fruits and vegetables. Try to fill one half of your plate at each meal with fruits and vegetables. Eat whole grains, such as whole-wheat pasta, brown rice, or whole-grain bread. Fill about one  fourth of your plate with whole grains. Eat or drink low-fat dairy products, such as skim milk or low-fat yogurt. Avoid fatty cuts of meat, processed or cured meats, and poultry with skin. Fill about one fourth of your plate with lean proteins, such as fish, chicken without skin, beans, eggs, or tofu. Avoid pre-made and processed foods. These tend to be higher in sodium, added sugar, and fat. Reduce your daily sodium intake. Many people with hypertension should eat less than 1,500 mg of sodium a day. Do not drink alcohol if: Your health care provider tells you not to drink. You are pregnant, may be pregnant, or are planning to become pregnant. If you drink alcohol: Limit how much you have to: 0-1 drink a day for women. 0-2 drinks a day for men. Know how much alcohol is in your drink. In the [place] one drink equals one 12 oz bottle of beer (355 mL), one 5 oz glass of wine (148 mL), or one 1 oz glass of hard liquor (44 mL). Lifestyle  Work with your health care provider to maintain a healthy body weight or to lose weight. Ask what an ideal weight is for you. Get at least 30 minutes of exercise that causes your heart to beat faster (aerobic exercise) most days of the week. Activities may include walking, swimming, or biking. Include exercise to strengthen your muscles (resistance exercise), such as Pilates or lifting weights, as part of your weekly exercise routine. Try to do these types of exercises for 30 minutes at least 3 days a week. Do not use any products that contain nicotine or tobacco. These products include cigarettes, chewing tobacco, and vaping devices, such as e-cigarettes. If you need help quitting, ask your health care provider. Monitor your blood pressure at home as told by your health care provider. Keep all follow-up visits. This is important. Medicines Take over-the-counter and prescription medicines only as told by your health care provider. Follow directions carefully. Blood  pressure medicines must be taken as prescribed. Do not skip doses of blood pressure medicine. Doing this puts you at risk for problems and can make the medicine less effective. Ask your health care provider about side effects or reactions to medicines that you should watch for. Contact a health care provider if you: Think you are having a reaction to a medicine you are taking. Have headaches that keep coming back (recurring). Feel dizzy. Have swelling in your ankles. Have trouble with your vision. Get help right away if you: Develop a severe headache or confusion. Have unusual weakness or numbness. Feel faint. Have severe pain in your chest or abdomen. Vomit repeatedly. Have trouble breathing. These symptoms may be an emergency. Get help right away. Call 911. Do not wait to see if the symptoms will go away. Do not drive yourself to the hospital. Summary Hypertension is when the force of blood pumping through your arteries is too strong. If this condition is not controlled, it may put you at risk for serious complications. Your personal target blood pressure may vary depending on your medical conditions, your age, and other factors. For most people, a normal blood pressure is less than 120/80. Hypertension is treated with lifestyle changes, medicines, or a combination of both. Lifestyle changes include losing weight, eating a healthy,   low-sodium diet, exercising more, and limiting alcohol. This information is not intended to replace advice given to you by your health care provider. Make sure you discuss any questions you have with your health care provider. Document Revised: 08/05/2021 Document Reviewed: 08/05/2021 Elsevier Patient Education  2023 [employer] Inc[employer]   Influenza (Flu) Vaccine (Inactivated or Recombinant): What You Need to Know 1. Why get vaccinated? Influenza vaccine can prevent influenza (flu). Flu is a contagious disease that spreads around the [place] every year,  usually between October and May. Anyone can get the flu, but it is more dangerous for some people. Infants and young children, people 65 years and older, pregnant people, and people with certain health conditions or a weakened immune system are at greatest risk of flu complications. Pneumonia, bronchitis, sinus infections, and ear infections are examples of flu-related complications. If you have a medical condition, such as heart disease, cancer, or diabetes, flu can make it worse. Flu can cause fever and chills, sore throat, muscle aches, fatigue, cough, headache, and runny or stuffy nose. Some people may have vomiting and diarrhea, though this is more common in children than adults. In an average year, thousands of people in the [place] die from flu, and many more are hospitalized. Flu vaccine prevents millions of illnesses and flu-related visits to the doctor each year. 2. Influenza vaccines CDC recommends everyone 6 months and older get vaccinated every flu season. Children 6 months through 8 years of age may need 2 doses during a single flu season. Everyone else needs only 1 dose each flu season. It takes about 2 weeks for protection to develop after vaccination. There are many flu viruses, and they are always changing. Each year a new flu vaccine is made to protect against the influenza viruses believed to be likely to cause disease in the upcoming flu season. Even when the vaccine doesn't exactly match these viruses, it may still provide some protection. Influenza vaccine does not cause flu. Influenza vaccine may be given at the same time as other vaccines. 3. Talk with your health care provider Tell your vaccination provider if the person getting the vaccine: Has had an allergic reaction after a previous dose of influenza vaccine, or has any severe, life-threatening allergies Has ever had Guillain-Marcum Syndrome (also called "GBS") In some cases, your health care provider may decide to  postpone influenza vaccination until a future visit. Influenza vaccine can be administered at any time during pregnancy. People who are or will be pregnant during influenza season should receive inactivated influenza vaccine. People with minor illnesses, such as a cold, may be vaccinated. People who are moderately or severely ill should usually wait until they recover before getting influenza vaccine. Your health care provider can give you more information. 4. Risks of a vaccine reaction Soreness, redness, and swelling where the shot is given, fever, muscle aches, and headache can happen after influenza vaccination. There may be a very small increased risk of Guillain-Willmon Syndrome (GBS) after inactivated influenza vaccine (the flu shot). Young children who get the flu shot along with pneumococcal vaccine (PCV13) and/or DTaP vaccine at the same time might be slightly more likely to have a seizure caused by fever. Tell your health care provider if a child who is getting flu vaccine has ever had a seizure. People sometimes faint after medical procedures, including vaccination. Tell your provider if you feel dizzy or have vision changes or ringing in the ears. As with any medicine, there is a very remote   chance of a vaccine causing a severe allergic reaction, other serious injury, or death. 5. What if there is a serious problem? An allergic reaction could occur after the vaccinated person leaves the clinic. If you see signs of a severe allergic reaction (hives, swelling of the face and throat, difficulty breathing, a fast heartbeat, dizziness, or weakness), call 9-1-1 and get the person to the nearest hospital. For other signs that concern you, call your health care provider. Adverse reactions should be reported to the Vaccine Adverse Event Reporting System (VAERS). Your health care provider will usually file this report, or you can do it yourself. Visit the VAERS website at www.vaers.hhs.gov or call  1-800-822-7967. VAERS is only for reporting reactions, and VAERS staff members do not give medical advice. 6. The National Vaccine Injury Compensation Program The National Vaccine Injury Compensation Program (VICP) is a federal program that was created to compensate people who may have been injured by certain vaccines. Claims regarding alleged injury or death due to vaccination have a time limit for filing, which may be as short as two years. Visit the VICP website at www.hrsa.gov/vaccinecompensation or call 1-800-338-2382 to learn about the program and about filing a claim. 7. How can I learn more? Ask your health care provider. Call your local or state health department. Visit the website of the Food and Drug Administration (FDA) for vaccine package inserts and additional information at www.fda.gov/vaccines-blood-biologics/vaccines. Contact the Centers for Disease Control and Prevention (CDC): Call 1-800-232-4636 (1-800-CDC-INFO) or Visit CDC's website at www.cdc.gov/flu. Source: CDC Vaccine Information Statement Inactivated Influenza Vaccine (05/17/2020) This same material is available at www.cdc.gov for no charge. This information is not intended to replace advice given to you by your health care provider. Make sure you discuss any questions you have with your health care provider. Document Revised: 08/26/2021 Document Reviewed: 06/19/2021 Elsevier Patient Education  2023 Elsevier Inc.  

## 2022-09-24 LAB — BMP8+EGFR
BUN/Creatinine Ratio: 13 (ref 9–20)
BUN: 14 mg/dL (ref 6–24)
CO2: 25 mmol/L (ref 20–29)
Calcium: 9.4 mg/dL (ref 8.7–10.2)
Chloride: 98 mmol/L (ref 96–106)
Creatinine, Ser: 1.11 mg/dL (ref 0.76–1.27)
Glucose: 94 mg/dL (ref 70–99)
Potassium: 4.3 mmol/L (ref 3.5–5.2)
Sodium: 139 mmol/L (ref 134–144)
eGFR: 81 mL/min/{1.73_m2} (ref >59)

## 2022-09-24 LAB — HEMOGLOBIN A1C
Est. average glucose Bld gHb Est-mCnc: 131 mg/dL
Hgb A1c MFr Bld: 6.2 % — ABNORMAL HIGH (ref 4.8–5.6)

## 2022-10-01 ENCOUNTER — Encounter: Payer: Self-pay | Admitting: Nurse Practitioner

## 2022-10-16 ENCOUNTER — Other Ambulatory Visit: Payer: Self-pay | Admitting: Nurse Practitioner

## 2022-10-16 MED ORDER — METFORMIN HCL 1000 MG PO TABS: 1000.0000 mg | ORAL_TABLET | Freq: Two times a day (BID) | ORAL | 1 refills | Status: DC

## 2022-10-17 ENCOUNTER — Ambulatory Visit
Admission: EM | Admit: 2022-10-17 | Discharge: 2022-10-17 | Disposition: A | Discharge status: Home / Self Care | Payer: Managed Care, Other (non HMO)

## 2022-10-17 DIAGNOSIS — J4541 Moderate persistent asthma with (acute) exacerbation: Secondary | ICD-10-CM | POA: Diagnosis not present

## 2022-10-17 DIAGNOSIS — J22 Unspecified acute lower respiratory infection: Secondary | ICD-10-CM

## 2022-10-17 MED ORDER — AMOXICILLIN-POT CLAVULANATE ER 1000-62.5 MG PO TB12: 2.0000 | ORAL_TABLET | Freq: Two times a day (BID) | ORAL | 0 refills | Status: AC

## 2022-10-17 MED ORDER — PREDNISONE 20 MG PO TABS: 60.0000 mg | ORAL_TABLET | Freq: Every day | ORAL | 0 refills | Status: AC

## 2022-10-17 MED ORDER — AZITHROMYCIN 250 MG PO TABS: 250.0000 mg | ORAL_TABLET | Freq: Every day | ORAL | 0 refills | Status: DC

## 2022-10-17 MED ORDER — IPRATROPIUM-ALBUTEROL 0.5-2.5 (3) MG/3ML IN SOLN: 3.0000 mL | RESPIRATORY_TRACT | 0 refills | Status: AC | PRN

## 2022-10-17 NOTE — ED Provider Notes (Addendum)
Troy Olson    CSN: [id number] Arrival date & time: 10/17/22  1006      History   Chief Complaint Chief Complaint  Patient presents with   Wheezing    Entered by patient   Cough    HPI Troy Olson is a 52 y.o. male.    Wheezing Associated symptoms: cough   Cough Associated symptoms: wheezing     Presents to urgent care with complaint of cough and wheezing starting yesterday.  Patient states he was seen about 2 weeks ago for similar symptoms and prescribed medication.  He states that his symptoms resolved for about a week after treatment but returned.  Patient states he presented to a "[facility]" urgent care facility and was prescribed Tamiflu, though he tested negative for flu he continue this medication, a prednisone taper (60-->10), benzonatate which he did not use since he did not have much of a cough, albuterol for his nebulizer.  He was also prescribed amoxicillin 875 (presumed Augmentin).  He states he took this medication and felt better after a while but then symptoms returned yesterday.  He states the wheezing is worse when he is laying down and improves somewhat after coughing.  PMH includes asthma.  He has no DM 2.  Past Medical History:  Diagnosis Date   Allergies    Allergy    Arthritis    Asthma    Back pain    Edema of both lower extremities    Food allergy    HLD (hyperlipidemia)    Hypertension    Morbid obesity ([place])    Prediabetes    Sleep apnea    CPAP    Patient Active Problem List   Diagnosis Date Noted   Prediabetes    Asthma in adult without complication 87/86/7672   Abscess of axilla, left 10/12/2017   Moderate persistent asthma 03/16/2012    Past Surgical History:  Procedure Laterality Date   COLONOSCOPY  01/27/2021   Normal   MOUTH SURGERY     widsom teeth       Home Medications    Prior to Admission medications   Medication Sig Start Date End Date Taking? Authorizing Provider  amoxicillin-clavulanate  (AUGMENTIN) 875-125 MG tablet SMARTSIG:1 Tablet(s) By Mouth Every 12 Hours 10/02/22  Yes [provider]  benzonatate (TESSALON) 200 MG capsule Take 200 mg by mouth 3 (three) times daily as needed. 09/29/22  Yes [provider]  oseltamivir (TAMIFLU) 75 MG capsule Take 75 mg by mouth 2 (two) times daily. 09/29/22  Yes [provider]  predniSONE (STERAPRED UNI-PAK 21 TAB) 10 MG (21) TBPK tablet See admin instructions. follow package directions 10/02/22  Yes [provider]  albuterol (VENTOLIN HFA) 108 (90 Base) MCG/ACT inhaler Inhale 2 puffs into the lungs every 6 (six) hours as needed. wheezing 11/05/20   [name], FNP  Blood Pressure Monitoring (BLOOD PRESSURE MONITOR/L CUFF) MISC Use to check blood pressure 11/14/20   Briscoe Deutscher, DO  cetirizine (ZYRTEC) 10 MG tablet Take 10 mg by mouth daily.    [provider]  clobetasol cream (TEMOVATE) 0.94 % Apply 1 Application topically 2 (two) times daily. 05/27/22   [name], FNP  EPINEPHrine (EPIPEN 2-PAK) 0.3 mg/0.3 mL IJ SOAJ injection Inject 0.3 mg into the muscle as needed for anaphylaxis. 06/19/21   Bary Castilla, NP  hydrochlorothiazide (MICROZIDE) 12.5 MG capsule TAKE 1 CAPSULE BY MOUTH TWICE  DAILY 04/17/22   [name], FNP  Magnesium 250 MG  TABS Take 1 tablet (250 mg total) by mouth daily. With evening meal 06/09/21   [name], FNP  metFORMIN (GLUCOPHAGE) 1000 MG tablet Take 1 tablet (1,000 mg total) by mouth 2 (two) times daily with a meal. 10/16/22   [name], FNP  montelukast (SINGULAIR) 10 MG tablet TAKE 1 TABLET BY MOUTH DAILY 02/09/22   [name], FNP  naproxen (NAPROSYN) 500 MG tablet Take 1 tablet (500 mg total) by mouth 2 (two) times daily with a meal. 11/05/20   [name], FNP  pravastatin (PRAVACHOL) 40 MG tablet TAKE 1 TABLET BY MOUTH IN THE  EVENING 08/17/22   [name], FNP    Family History Family History  Problem Relation Age of Onset   Hypertension  Father    Heart disease Father        stents placed   Cancer Sister    Ovarian cancer Sister        male cancer ? if ovaries   Asthma Son    Colon cancer Neg Hx    Esophageal cancer Neg Hx    Stomach cancer Neg Hx    Rectal cancer Neg Hx     Social History Social History   Tobacco Use   Smoking status: Some Days    Packs/day: 0.50    Years: 20.00    Total pack years: 10.00    Types: Cigars, Cigarettes    Last attempt to quit: 03/13/2011    Years since quitting: 11.6   Smokeless tobacco: Never   Tobacco comments:    Smokes cigars occasionally.  Vaping Use   Vaping Use: Never used  Substance Use Topics   Alcohol use: Yes    Comment: occasionally--dark liquer 4-5 drinks consumed weekly   Drug use: No     Allergies   Food allergy formula and Peanut-containing drug products   Review of Systems Review of Systems  Respiratory:  Positive for cough and wheezing.      Physical Exam Triage Vital Signs ED Triage Vitals  Enc Vitals Group     BP 10/17/22 1026 (!) 145/78     Pulse Rate 10/17/22 1026 (!) 113     Resp 10/17/22 1026 18     Temp 10/17/22 1026 98.1 F (36.7 C)     Temp src --      SpO2 10/17/22 1026 98 %     Weight --      Height --      Head Circumference --      Peak Flow --      Pain Score 10/17/22 1027 0     Pain Loc --      Pain Edu? --      Excl. in GC? --    No data found.  Updated Vital Signs BP (!) 145/78   Pulse (!) 113   Temp 98.1 F (36.7 C)   Resp 18   SpO2 98%   Visual Acuity Right Eye Distance:   Left Eye Distance:   Bilateral Distance:    Right Eye Near:   Left Eye Near:    Bilateral Near:     Physical Exam Vitals reviewed.  Constitutional:      Appearance: Normal appearance. He is not ill-appearing.  Cardiovascular:     Rate and Rhythm: Normal rate and regular rhythm.     Pulses: Normal pulses.     Heart sounds: Normal heart sounds.  Pulmonary:     Effort: Pulmonary effort is normal.  Breath sounds: Normal  breath sounds. No wheezing or rhonchi.  Skin:    General: Skin is warm and dry.  Neurological:     General: No focal deficit present.     Mental Status: He is alert and oriented to person, place, and time.  Psychiatric:        Mood and Affect: Mood normal.        Behavior: Behavior normal.      UC Treatments / Results  Labs (all labs ordered are listed, but only abnormal results are displayed) Labs Reviewed - No data to display  EKG   Radiology No results found.  Procedures Procedures (including critical care time)  Medications Ordered in UC Medications - No data to display  Initial Impression / Assessment and Plan / UC Course  I have reviewed the triage vital signs and the nursing notes.  Pertinent labs & imaging results that were available during my care of the patient were reviewed by me and considered in my medical decision making (see chart for details).   Patient is afebrile here without recent antipyretics. Satting well on room air. Overall is well appearing, well hydrated, without respiratory distress. Pulmonary exam is unremarkable.  Lungs CTAB without wheezing, rhonchi, rales.  I suspect the patient's initial symptoms treated with Augmentin were viral and the Augmentin had no effect on his symptoms.  Symptom improvement was likely from prednisone and perhaps symptoms returned after prednisone was completed because the virus was still active and causing inflammation.  I will retreat the patient with a course of prednisone, also recommending he use guaifenesin daily for mucolytic therapy.  Will prescribe another course of an antibiotic (high dose augmentin) to cover any bacterial secondary infection which may have started since the initial viral infection, given his risk of poor outcome related to asthma and obesity.  He will continue to use the benzonatate previously prescribed.  Final Clinical Impressions(s) / UC Diagnoses   Final diagnoses:  None   Discharge  Instructions   None    ED Prescriptions   None    PDMP not reviewed this encounter.   Charma Igo, FNP 10/17/22 1114    ImmordinoJeannett Senior, FNP 10/17/22 1116

## 2022-10-17 NOTE — ED Triage Notes (Signed)
Pt. Presents to UC w/ c/o a cough and wheezing that started yesterday. Pt. States about two weeks ago he was seen for similar symptoms and prescribed medication. After finishing his prescribed regimen symptoms resolved for a week but returned yesterday.

## 2022-10-17 NOTE — Discharge Instructions (Signed)
I have prescribed 2 different antibiotics to be used simultaneously to cover for any risk of pneumonia given your asthma and risk of poor outcome.  Also prescribing prednisone as we discussed to reduce any inflammatory reaction causing wheezing and difficulty breathing.  Take this medication first thing in the morning with food.  I recommend using a daily acid reducing medication to avoid irritation to your stomach.  I recommend guaifenesin (Mucinex) with plenty of water throughout the day to help thin and loosen mucus secretions in your respiratory passages.

## 2022-12-23 ENCOUNTER — Encounter: Payer: Self-pay | Admitting: Nurse Practitioner

## 2022-12-24 ENCOUNTER — Other Ambulatory Visit: Payer: Self-pay

## 2022-12-24 MED ORDER — ALBUTEROL SULFATE HFA 108 (90 BASE) MCG/ACT IN AERS: 2.0000 | INHALATION_SPRAY | Freq: Four times a day (QID) | RESPIRATORY_TRACT | 5 refills | Status: DC | PRN

## 2023-01-26 ENCOUNTER — Ambulatory Visit: Payer: Managed Care, Other (non HMO) | Admitting: Nurse Practitioner

## 2023-01-26 ENCOUNTER — Encounter: Payer: Self-pay | Admitting: Nurse Practitioner

## 2023-01-26 VITALS — BP 148/108 | HR 79 | Temp 97.9°F | Ht 68.0 in | Wt 330.6 lb

## 2023-01-26 DIAGNOSIS — I10 Essential (primary) hypertension: Secondary | ICD-10-CM | POA: Diagnosis not present

## 2023-01-26 DIAGNOSIS — K649 Unspecified hemorrhoids: Secondary | ICD-10-CM

## 2023-01-26 DIAGNOSIS — Z6841 Body Mass Index (BMI) 40.0 and over, adult: Secondary | ICD-10-CM

## 2023-01-26 DIAGNOSIS — G4733 Obstructive sleep apnea (adult) (pediatric): Secondary | ICD-10-CM | POA: Diagnosis not present

## 2023-01-26 DIAGNOSIS — R7303 Prediabetes: Secondary | ICD-10-CM | POA: Diagnosis not present

## 2023-01-26 MED ORDER — WEGOVY 0.25 MG/0.5ML ~~LOC~~ SOAJ: 0.2500 mg | SUBCUTANEOUS | 0 refills | Status: DC

## 2023-01-26 MED ORDER — AMLODIPINE BESYLATE 2.5 MG PO TABS: 2.5000 mg | ORAL_TABLET | Freq: Every day | ORAL | 1 refills | Status: DC

## 2023-01-26 MED ORDER — HYDROCORTISONE ACETATE 25 MG RE SUPP: 25.0000 mg | Freq: Two times a day (BID) | RECTAL | 1 refills | Status: AC

## 2023-01-26 NOTE — Progress Notes (Signed)
I,[name],acting as a Neurosurgeon for Troy Felts, FNP.,have documented all relevant documentation on the behalf of Troy Felts, FNP,as directed by  Troy Felts, FNP while in the presence of Troy Felts, FNP.    Subjective:     Patient ID: Troy Olson , male    DOB: 1971-09-28 , 52 y.o.   MRN: [id number]   Chief Complaint  Patient presents with   Medical Management of Chronic Issues    HPI  Patient presents today for hypertension follow up. Want to talk about something for hemorrhoids  He continues to wear his CPAP nightly from [facility], last checked by [facility] last year due to not blowing out as much air.   He has not drank as much water today due to being in the truck.  He was doing crossfit after 6 weeks in had an upper respiratory infection. Once he stops      Past Medical History:  Diagnosis Date   Allergies    Allergy    Arthritis    Asthma    Back pain    Edema of both lower extremities    Food allergy    HLD (hyperlipidemia)    Hypertension    Morbid obesity (HCC)    Prediabetes    Sleep apnea    CPAP     Family History  Problem Relation Age of Onset   Hypertension Father    Heart disease Father        stents placed   Cancer Sister    Ovarian cancer Sister        male cancer ? if ovaries   Asthma Son    Colon cancer Neg Hx    Esophageal cancer Neg Hx    Stomach cancer Neg Hx    Rectal cancer Neg Hx      Current Outpatient Medications:    albuterol (VENTOLIN HFA) 108 (90 Base) MCG/ACT inhaler, Inhale 2 puffs into the lungs every 6 (six) hours as needed. wheezing, Disp: 1 each, Rfl: 5   azithromycin (ZITHROMAX) 250 MG tablet, Take 1 tablet (250 mg total) by mouth daily. Take first 2 tablets together, then 1 every day until finished., Disp: 6 tablet, Rfl: 0   benzonatate (TESSALON) 200 MG capsule, Take 200 mg by mouth 3 (three) times daily as needed., Disp: , Rfl:    Blood Pressure Monitoring (BLOOD PRESSURE MONITOR/L CUFF) MISC, Use to  check blood pressure, Disp: 1 each, Rfl: 0   cetirizine (ZYRTEC) 10 MG tablet, Take 10 mg by mouth daily., Disp: , Rfl:    clobetasol cream (TEMOVATE) 0.05 %, Apply 1 Application topically 2 (two) times daily., Disp: 60 g, Rfl: 3   EPINEPHrine (EPIPEN 2-PAK) 0.3 mg/0.3 mL IJ SOAJ injection, Inject 0.3 mg into the muscle as needed for anaphylaxis., Disp: 1 each, Rfl: 0   hydrochlorothiazide (MICROZIDE) 12.5 MG capsule, TAKE 1 CAPSULE BY MOUTH TWICE  DAILY, Disp: 180 capsule, Rfl: 3   hydrocortisone (ANUSOL-HC) 25 MG suppository, Place 1 suppository (25 mg total) rectally every 12 (twelve) hours., Disp: 12 suppository, Rfl: 1   Magnesium 250 MG TABS, Take 1 tablet (250 mg total) by mouth daily. With evening meal, Disp: 30 tablet, Rfl: 0   metFORMIN (GLUCOPHAGE) 1000 MG tablet, Take 1 tablet (1,000 mg total) by mouth 2 (two) times daily with a meal., Disp: 180 tablet, Rfl: 1   montelukast (SINGULAIR) 10 MG tablet, TAKE 1 TABLET BY MOUTH DAILY, Disp: 30 tablet, Rfl: 11   naproxen (NAPROSYN)  500 MG tablet, Take 1 tablet (500 mg total) by mouth 2 (two) times daily with a meal., Disp: 30 tablet, Rfl: 0   oseltamivir (TAMIFLU) 75 MG capsule, Take 75 mg by mouth 2 (two) times daily., Disp: , Rfl:    pravastatin (PRAVACHOL) 40 MG tablet, TAKE 1 TABLET BY MOUTH IN THE  EVENING, Disp: 90 tablet, Rfl: 3   amLODipine (NORVASC) 2.5 MG tablet, Take 1 tablet (2.5 mg total) by mouth daily., Disp: 90 tablet, Rfl: 1   ipratropium-albuterol (DUONEB) 0.5-2.5 (3) MG/3ML SOLN, Take 3 mLs by nebulization every 4 (four) hours as needed for up to 14 days., Disp: 180 mL, Rfl: 0   Semaglutide-Weight Management (WEGOVY) 0.25 MG/0.5ML SOAJ, Inject 0.25 mg into the skin once a week., Disp: 2 mL, Rfl: 0   Allergies  Allergen Reactions   Food Allergy Formula Shortness Of Breath and Swelling    Any nuts of any kind.   Peanut-Containing Drug Products Shortness Of Breath, Diarrhea, Nausea And Vomiting and Swelling     Review of  Systems  Constitutional: Negative.   Respiratory: Negative.    Cardiovascular: Negative.   Gastrointestinal:        Hemorroids   All other systems reviewed and are negative.    Today's Vitals   01/26/23 1438 01/26/23 1538  BP: (!) 130/100 (!) 148/108  Pulse: 79   Temp: 97.9 F (36.6 C)   TempSrc: Oral   SpO2: 97%   Weight: (!) 330 lb 9.6 oz (150 kg)   Height: 5\' 8"  (1.727 m)    Body mass index is 50.27 kg/m.   Objective:  Physical Exam Vitals reviewed.  Constitutional:      General: He is not in acute distress.    Appearance: Normal appearance. He is obese.  Pulmonary:     Effort: Pulmonary effort is normal. No respiratory distress.     Breath sounds: No wheezing.  Skin:    General: Skin is warm.     Capillary Refill: Capillary refill takes less than 2 seconds.  Neurological:     General: No focal deficit present.     Mental Status: He is alert and oriented to person, place, and time.     Cranial Nerves: No cranial nerve deficit.     Motor: No weakness.  Psychiatric:        Mood and Affect: Mood normal.        Behavior: Behavior normal.        Thought Content: Thought content normal.        Judgment: Judgment normal.         Assessment And Plan:     1. Uncontrolled hypertension Comments: Blood pressure is not well-controlled.  He has not taken his medications.  Advised to do so as soon as possible.  Avoid high salt foods. RTO 2 weeks NV BP - BMP8+eGFR  2. Prediabetes Comments: Diet controlled.  Continue focusing on avoiding sugary foods and drinks.  Encouraged to exercise at least 150 minutes/week. - Hemoglobin A1c - BMP8+eGFR  3. OSA on CPAP Comments: Continue wearing CPAP.  Notified [facility] if you are having any issues. Discussed risk of cardiac events, worsening HTN and poor wt loss with OSA and poor sleep  4. Hemorrhoids, unspecified hemorrhoid type Comments: Rx for anusol sent to pharmacy - hydrocortisone (ANUSOL-HC) 25 MG suppository; Place 1  suppository (25 mg total) rectally every 12 (twelve) hours.  Dispense: 12 suppository; Refill: 1  5. Morbid obesity with BMI of 50.0-59.9, adult (  HCC) Chronic,has has significant weight gain but has lost 7 lbs over the last 4 months Discussed healthy diet and regular exercise options  Encouraged to exercise at least 150 minutes per week with 2 days of strength training Will start Lifescape pending insurance approval she is to titrate weekly, discussed side effects of nausea, abdominal pain or difficulty swallowing to notify office. He had been on previously but was having difficulty with getting the medication due to back order Return in 2 months for weight check.   Patient was given opportunity to ask questions. Patient verbalized understanding of the plan and was able to repeat key elements of the plan. All questions were answered to their satisfaction.  Troy Felts, FNP   I, Troy Felts, FNP, have reviewed all documentation for this visit. The documentation on 01/26/23 for the exam, diagnosis, procedures, and orders are all accurate and complete.   IF YOU HAVE BEEN REFERRED TO A SPECIALIST, IT MAY TAKE 1-2 WEEKS TO SCHEDULE/PROCESS THE REFERRAL. IF YOU HAVE NOT HEARD FROM US/SPECIALIST IN TWO WEEKS, PLEASE GIVE [place] A CALL AT [phone].   THE PATIENT IS ENCOURAGED TO PRACTICE SOCIAL DISTANCING DUE TO THE COVID-19 PANDEMIC.

## 2023-01-27 ENCOUNTER — Other Ambulatory Visit: Payer: Self-pay

## 2023-01-27 DIAGNOSIS — I10 Essential (primary) hypertension: Secondary | ICD-10-CM

## 2023-01-27 LAB — HEMOGLOBIN A1C
Est. average glucose Bld gHb Est-mCnc: 128 mg/dL
Hgb A1c MFr Bld: 6.1 % — ABNORMAL HIGH (ref 4.8–5.6)

## 2023-01-27 LAB — BMP8+EGFR
BUN/Creatinine Ratio: 12 (ref 9–20)
BUN: 13 mg/dL (ref 6–24)
CO2: 25 mmol/L (ref 20–29)
Calcium: 9.4 mg/dL (ref 8.7–10.2)
Chloride: 98 mmol/L (ref 96–106)
Creatinine, Ser: 1.05 mg/dL (ref 0.76–1.27)
Glucose: 92 mg/dL (ref 70–99)
Potassium: 3.7 mmol/L (ref 3.5–5.2)
Sodium: 139 mmol/L (ref 134–144)
eGFR: 87 mL/min/{1.73_m2} (ref >59)

## 2023-01-27 MED ORDER — AMLODIPINE BESYLATE 2.5 MG PO TABS: 2.5000 mg | ORAL_TABLET | Freq: Every day | ORAL | 1 refills | Status: DC

## 2023-01-28 ENCOUNTER — Telehealth: Payer: Self-pay

## 2023-01-28 DIAGNOSIS — I10 Essential (primary) hypertension: Secondary | ICD-10-CM | POA: Insufficient documentation

## 2023-01-28 NOTE — Telephone Encounter (Signed)
[facility]: [id number]

## 2023-01-28 NOTE — Telephone Encounter (Signed)
Per [facility] they will not cover Wegovy unless it is for the maintenance doses of 1.7mg  or 2.4mg . Patient cannot restart therapy on either of these doses. Call placed to [facility] for clarification. They could not understand the denial reason either. The rep started a new PA and recommended we fax in OV notes and labs for documentation. She also recommended faxing the same information for the appeal.   All requested information faxed on the new PA to [phone]  All requested information faxed on the appeal to [phone]  Will await further response from [facility].

## 2023-02-03 NOTE — Telephone Encounter (Signed)
Message from Plan Request Reference Number: [id number]. WEGOVY INJ 0.25MG  is denied for not meeting the prior authorization requirement(s). Details of this decision are in the notice attached below or have been faxed to you. PAAppealSupportedResponse Appeal Case [id number] is overturned. For further questions, call [phone].

## 2023-02-04 ENCOUNTER — Other Ambulatory Visit: Payer: Self-pay

## 2023-02-04 MED ORDER — WEGOVY 0.25 MG/0.5ML ~~LOC~~ SOAJ: 0.2500 mg | SUBCUTANEOUS | 0 refills | Status: DC

## 2023-02-04 NOTE — Telephone Encounter (Signed)
[name] has been approved until 09/04/2023

## 2023-02-09 ENCOUNTER — Ambulatory Visit: Payer: Managed Care, Other (non HMO)

## 2023-02-09 VITALS — BP 130/82 | HR 97 | Temp 98.5°F | Ht 68.0 in | Wt 330.0 lb

## 2023-02-09 DIAGNOSIS — I10 Essential (primary) hypertension: Secondary | ICD-10-CM

## 2023-02-09 NOTE — Progress Notes (Signed)
Patient presents today for a BP check, patient currently taking amLODipine 2.5mg  PM, HCTZ 12.5mg  AM.  BP Readings from Last 3 Encounters:  02/09/23 (!) 132/90  01/26/23 (!) 148/108  10/17/22 (!) 145/78  Per Provider- Continue with current dose.

## 2023-02-16 ENCOUNTER — Ambulatory Visit: Payer: Self-pay | Admitting: Nurse Practitioner

## 2023-02-16 NOTE — Progress Notes (Signed)
Did not need this visit

## 2023-02-17 ENCOUNTER — Other Ambulatory Visit: Payer: Self-pay

## 2023-02-17 ENCOUNTER — Encounter: Payer: Managed Care, Other (non HMO) | Admitting: Nurse Practitioner

## 2023-02-17 DIAGNOSIS — G4733 Obstructive sleep apnea (adult) (pediatric): Secondary | ICD-10-CM

## 2023-02-17 MED ORDER — WEGOVY 0.25 MG/0.5ML ~~LOC~~ SOAJ: 0.2500 mg | SUBCUTANEOUS | 0 refills | Status: DC

## 2023-02-17 MED ORDER — ALBUTEROL SULFATE HFA 108 (90 BASE) MCG/ACT IN AERS: 2.0000 | INHALATION_SPRAY | Freq: Four times a day (QID) | RESPIRATORY_TRACT | 5 refills | Status: AC | PRN

## 2023-03-03 NOTE — Telephone Encounter (Signed)
Appeal for [name] has been APPROVED  SUMMARY OF PRIOR AUTHORIZATION DECISION DECISION: Approved DRUG Wegovy Inj 0.25mg  NAME: Troy Olson PATIENT Member ID: [id number] NAME: Case number: [id number] NEXT You can now fill your prescription for this medication. STEPS: VALID 01/28/2023 - 09/04/2023

## 2023-03-18 ENCOUNTER — Ambulatory Visit: Payer: Self-pay | Admitting: Nurse Practitioner

## 2023-03-20 ENCOUNTER — Other Ambulatory Visit: Payer: Self-pay | Admitting: Nurse Practitioner

## 2023-03-20 DIAGNOSIS — I10 Essential (primary) hypertension: Secondary | ICD-10-CM

## 2023-04-01 ENCOUNTER — Other Ambulatory Visit: Payer: Self-pay | Admitting: Nurse Practitioner

## 2023-04-27 ENCOUNTER — Ambulatory Visit: Payer: Managed Care, Other (non HMO) | Admitting: Nurse Practitioner

## 2023-04-27 ENCOUNTER — Encounter: Payer: Self-pay | Admitting: Nurse Practitioner

## 2023-04-27 ENCOUNTER — Ambulatory Visit
Admission: RE | Admit: 2023-04-27 | Discharge: 2023-04-27 | Disposition: A | Discharge status: Home / Self Care | Payer: Managed Care, Other (non HMO) | Source: Ambulatory Visit | Attending: Nurse Practitioner | Admitting: Nurse Practitioner

## 2023-04-27 ENCOUNTER — Other Ambulatory Visit: Payer: Self-pay

## 2023-04-27 VITALS — BP 124/80 | HR 76 | Temp 98.1°F | Ht 68.0 in | Wt 313.0 lb

## 2023-04-27 DIAGNOSIS — M545 Low back pain, unspecified: Secondary | ICD-10-CM

## 2023-04-27 DIAGNOSIS — I10 Essential (primary) hypertension: Secondary | ICD-10-CM

## 2023-04-27 DIAGNOSIS — K439 Ventral hernia without obstruction or gangrene: Secondary | ICD-10-CM

## 2023-04-27 DIAGNOSIS — K469 Unspecified abdominal hernia without obstruction or gangrene: Secondary | ICD-10-CM | POA: Diagnosis not present

## 2023-04-27 DIAGNOSIS — Z6841 Body Mass Index (BMI) 40.0 and over, adult: Secondary | ICD-10-CM | POA: Insufficient documentation

## 2023-04-27 DIAGNOSIS — R109 Unspecified abdominal pain: Secondary | ICD-10-CM | POA: Diagnosis not present

## 2023-04-27 MED ORDER — WEGOVY 0.5 MG/0.5ML ~~LOC~~ SOAJ: 0.5000 mg | SUBCUTANEOUS | 0 refills | Status: DC

## 2023-04-27 NOTE — Assessment & Plan Note (Signed)
Blood pressure is better controlled, continue current medications

## 2023-04-27 NOTE — Assessment & Plan Note (Signed)
Will check abdomen ultrasound

## 2023-04-27 NOTE — Assessment & Plan Note (Signed)
She is encouraged to strive for BMI less than 30 to decrease cardiac risk. Advised to aim for at least 150 minutes of exercise per week. Congratulated on his 7 lb weight loss, will increase Wegovy to 0.5 mg weekly. Had been off for one week while out of town

## 2023-04-27 NOTE — Assessment & Plan Note (Addendum)
Pain to right side of abdomen with palpation and with movement, will check abdomen ultrasound and he needs to contact General Surgery to see any additional plans for his ventral hernia.
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
## 2023-04-27 NOTE — Assessment & Plan Note (Addendum)
Tenderness to left low back, I am not sure this is muscle vs inflammation. He is to go to [facility] for an xray this may be referred pain related to his early DJD in 2021

## 2023-04-27 NOTE — Patient Instructions (Signed)
[facility] [phone] call them to make aware feels like the are has enlarged.

## 2023-04-28 ENCOUNTER — Other Ambulatory Visit: Payer: Self-pay | Admitting: Nurse Practitioner

## 2023-04-28 DIAGNOSIS — M545 Low back pain, unspecified: Secondary | ICD-10-CM

## 2023-05-03 ENCOUNTER — Encounter: Payer: Self-pay | Admitting: Physical Medicine and Rehabilitation

## 2023-05-03 ENCOUNTER — Ambulatory Visit (INDEPENDENT_AMBULATORY_CARE_PROVIDER_SITE_OTHER): Payer: Managed Care, Other (non HMO) | Admitting: Physical Medicine and Rehabilitation

## 2023-05-03 DIAGNOSIS — M47816 Spondylosis without myelopathy or radiculopathy, lumbar region: Secondary | ICD-10-CM | POA: Diagnosis not present

## 2023-05-03 DIAGNOSIS — M47819 Spondylosis without myelopathy or radiculopathy, site unspecified: Secondary | ICD-10-CM

## 2023-05-03 DIAGNOSIS — G8929 Other chronic pain: Secondary | ICD-10-CM

## 2023-05-03 DIAGNOSIS — M545 Low back pain, unspecified: Secondary | ICD-10-CM

## 2023-05-03 NOTE — Progress Notes (Signed)
Functional Pain Scale - descriptive words and definitions  Mild (2)   Noticeable when not distracted/no impact on ADL's/sleep only slightly affected and able to   use both passive and active distraction for comfort. Mild range order  Average Pain 5  Lower back pain on both sides with no radiation in the legs. Started a week ago
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
## 2023-05-04 ENCOUNTER — Ambulatory Visit
Admission: RE | Admit: 2023-05-04 | Discharge: 2023-05-04 | Disposition: A | Discharge status: Home / Self Care | Payer: Managed Care, Other (non HMO) | Source: Ambulatory Visit | Attending: Nurse Practitioner | Admitting: Nurse Practitioner

## 2023-05-04 DIAGNOSIS — K469 Unspecified abdominal hernia without obstruction or gangrene: Secondary | ICD-10-CM

## 2023-05-24 ENCOUNTER — Other Ambulatory Visit: Payer: Self-pay | Admitting: Nurse Practitioner

## 2023-06-07 ENCOUNTER — Encounter: Payer: Managed Care, Other (non HMO) | Admitting: Nurse Practitioner

## 2023-08-02 ENCOUNTER — Encounter: Payer: Self-pay | Admitting: Nurse Practitioner

## 2023-08-02 ENCOUNTER — Ambulatory Visit: Payer: Managed Care, Other (non HMO) | Admitting: Nurse Practitioner

## 2023-08-02 ENCOUNTER — Telehealth: Payer: Self-pay | Admitting: Physical Medicine and Rehabilitation

## 2023-08-02 VITALS — BP 120/80 | HR 54 | Temp 98.7°F | Ht 68.0 in | Wt 314.8 lb

## 2023-08-02 DIAGNOSIS — E66813 Obesity, class 3: Secondary | ICD-10-CM

## 2023-08-02 DIAGNOSIS — M79675 Pain in left toe(s): Secondary | ICD-10-CM

## 2023-08-02 DIAGNOSIS — Z6841 Body Mass Index (BMI) 40.0 and over, adult: Secondary | ICD-10-CM

## 2023-08-02 DIAGNOSIS — R7303 Prediabetes: Secondary | ICD-10-CM | POA: Diagnosis not present

## 2023-08-02 DIAGNOSIS — I10 Essential (primary) hypertension: Secondary | ICD-10-CM

## 2023-08-02 DIAGNOSIS — G8929 Other chronic pain: Secondary | ICD-10-CM | POA: Diagnosis not present

## 2023-08-02 DIAGNOSIS — Z9989 Dependence on other enabling machines and devices: Secondary | ICD-10-CM

## 2023-08-02 DIAGNOSIS — M545 Low back pain, unspecified: Secondary | ICD-10-CM | POA: Diagnosis not present

## 2023-08-02 DIAGNOSIS — Z23 Encounter for immunization: Secondary | ICD-10-CM

## 2023-08-02 DIAGNOSIS — L608 Other nail disorders: Secondary | ICD-10-CM

## 2023-08-02 DIAGNOSIS — G4733 Obstructive sleep apnea (adult) (pediatric): Secondary | ICD-10-CM

## 2023-08-02 MED ORDER — WEGOVY 1 MG/0.5ML ~~LOC~~ SOAJ: 1.0000 mg | SUBCUTANEOUS | 2 refills | Status: DC

## 2023-08-02 MED ORDER — NAPROXEN 500 MG PO TABS: 500.0000 mg | ORAL_TABLET | Freq: Two times a day (BID) | ORAL | 0 refills | Status: DC

## 2023-08-02 MED ORDER — CEPHALEXIN 500 MG PO CAPS: 500.0000 mg | ORAL_CAPSULE | Freq: Four times a day (QID) | ORAL | 0 refills | Status: AC

## 2023-08-02 NOTE — Telephone Encounter (Signed)
Patient called needing to schedule an appointment with Dr. Alvester Morin for an injection in his back. The  number to contact patient is [phone]

## 2023-08-02 NOTE — Assessment & Plan Note (Addendum)
Usage and benefits of CPAP.

## 2023-08-02 NOTE — Progress Notes (Addendum)
Madelaine Bhat, CMA,acting as a Neurosurgeon for [name], FNP.,have documented all relevant documentation on the behalf of [name], FNP,as directed by  [name], FNP while in the presence of [name], FNP.  Subjective:  Patient ID: Troy Olson , male    DOB: 04-22-1971 , 52 y.o.   MRN: [id number]  Chief Complaint  Patient presents with   Hypertension          HPI  Patient presents today for a bp and pre dm follow up, Patient reports compliance with medication. Patient denies any chest pain, SOB, or headaches.  Patient reports his left great toe is having a lot of pain since a pedicure about a week ago. Patient reports at home is blood pressure has been high. This was at home 151/100. He will feel his heart racing at night. He continues to use the one he has and notices the pressure is not as high as it should be. He is still awaiting a new machine from [facility].   BP Readings from Last 3 Encounters: 08/02/23 : 120/88 04/27/23 : 124/80 02/09/23 : 130/82       Past Medical History:  Diagnosis Date   Abscess of axilla, left 10/12/2017   Allergies    Allergy    Arthritis    Asthma    Back pain    Edema of both lower extremities    Food allergy    HLD (hyperlipidemia)    Hypertension    Morbid obesity (HCC)    Prediabetes    Sleep apnea    CPAP     Family History  Problem Relation Age of Onset   Hypertension Father    Heart disease Father        stents placed   Cancer Sister    Ovarian cancer Sister        male cancer ? if ovaries   Asthma Son    Colon cancer Neg Hx    Esophageal cancer Neg Hx    Stomach cancer Neg Hx    Rectal cancer Neg Hx      Current Outpatient Medications:    albuterol (VENTOLIN HFA) 108 (90 Base) MCG/ACT inhaler, Inhale 2 puffs into the lungs every 6 (six) hours as needed. wheezing, Disp: 1 each, Rfl: 5   amLODipine (NORVASC) 2.5 MG tablet, Take 1 tablet (2.5 mg total) by mouth daily., Disp: 90 tablet, Rfl: 1   benzonatate  (TESSALON) 200 MG capsule, Take 200 mg by mouth 3 (three) times daily as needed., Disp: , Rfl:    Blood Pressure Monitoring (BLOOD PRESSURE MONITOR/L CUFF) MISC, Use to check blood pressure, Disp: 1 each, Rfl: 0   cetirizine (ZYRTEC) 10 MG tablet, Take 10 mg by mouth daily., Disp: , Rfl:    clobetasol cream (TEMOVATE) 0.05 %, Apply 1 Application topically 2 (two) times daily., Disp: 60 g, Rfl: 3   EPINEPHrine (EPIPEN 2-PAK) 0.3 mg/0.3 mL IJ SOAJ injection, Inject 0.3 mg into the muscle as needed for anaphylaxis., Disp: 1 each, Rfl: 0   hydrochlorothiazide (MICROZIDE) 12.5 MG capsule, TAKE 1 CAPSULE BY MOUTH TWICE  DAILY, Disp: 180 capsule, Rfl: 3   hydrocortisone (ANUSOL-HC) 25 MG suppository, Place 1 suppository (25 mg total) rectally every 12 (twelve) hours., Disp: 12 suppository, Rfl: 1   Magnesium 250 MG TABS, Take 1 tablet (250 mg total) by mouth daily. With evening meal, Disp: 30 tablet, Rfl: 0   metFORMIN (GLUCOPHAGE) 1000 MG tablet, TAKE 1 TABLET BY MOUTH TWICE  DAILY WITH A MEAL, Disp: 180 tablet, Rfl: 3   montelukast (SINGULAIR) 10 MG tablet, TAKE 1 TABLET BY MOUTH DAILY, Disp: 90 tablet, Rfl: 3   Semaglutide-Weight Management (WEGOVY) 1 MG/0.5ML SOAJ, Inject 1 mg into the skin once a week., Disp: 2 mL, Rfl: 2   ipratropium-albuterol (DUONEB) 0.5-2.5 (3) MG/3ML SOLN, Take 3 mLs by nebulization every 4 (four) hours as needed for up to 14 days., Disp: 180 mL, Rfl: 0   naproxen (NAPROSYN) 500 MG tablet, Take 1 tablet (500 mg total) by mouth 2 (two) times daily with a meal., Disp: 30 tablet, Rfl: 0   pravastatin (PRAVACHOL) 40 MG tablet, TAKE 1 TABLET BY MOUTH IN THE  EVENING, Disp: 90 tablet, Rfl: 3   terbinafine (LAMISIL) 250 MG tablet, Take 1 tablet (250 mg total) by mouth daily., Disp: 90 tablet, Rfl: 0   Allergies  Allergen Reactions   Food Allergy Formula Shortness Of Breath and Swelling    Any nuts of any kind.   Peanut-Containing Drug Products Shortness Of Breath, Diarrhea, Nausea  And Vomiting and Swelling     Review of Systems  Constitutional: Negative.   HENT: Negative.    Eyes: Negative.   Respiratory: Negative.    Cardiovascular: Negative.   Gastrointestinal: Negative.   Psychiatric/Behavioral: Negative.       Today's Vitals   08/02/23 1031 08/02/23 1107  BP: 120/88 120/80  Pulse: (!) 54   Temp: 98.7 F (37.1 C)   TempSrc: Oral   Weight: (!) 314 lb 12.8 oz (142.8 kg)   Height: 5\' 8"  (1.727 m)   PainSc: 3    PainLoc: Toe    Body mass index is 47.87 kg/m.  Wt Readings from Last 3 Encounters:  08/11/23 (!) 320 lb (145.2 kg)  08/02/23 (!) 314 lb 12.8 oz (142.8 kg)  04/27/23 (!) 313 lb (142 kg)      Objective:  Physical Exam Vitals reviewed.  Constitutional:      General: He is not in acute distress.    Appearance: Normal appearance. He is obese.  Pulmonary:     Effort: Pulmonary effort is normal. No respiratory distress.     Breath sounds: No wheezing.  Skin:    General: Skin is warm.     Capillary Refill: Capillary refill takes less than 2 seconds.  Neurological:     General: No focal deficit present.     Mental Status: He is alert and oriented to person, place, and time.     Cranial Nerves: No cranial nerve deficit.     Motor: No weakness.  Psychiatric:        Mood and Affect: Mood normal.        Behavior: Behavior normal.        Thought Content: Thought content normal.        Judgment: Judgment normal.         Assessment And Plan:  Essential hypertension Assessment & Plan: Blood pressure is well controlled. Continue current medications  Orders: -     Basic metabolic panel -     CBC with Differential/Platelet  Prediabetes Assessment & Plan: Hgba1c is stable, continue focusing on healthy diet and regular exercise  Orders: -     Basic metabolic panel -     Hemoglobin A1c  Chronic midline low back pain without sciatica Assessment & Plan: Refilled naproxyn and advised to contact [facility] about his  injections  Orders: -     Naproxen; Take 1 tablet (500 mg total)  by mouth 2 (two) times daily with a meal.  Dispense: 30 tablet; Refill: 0  Pain of toe of left foot Assessment & Plan: Tenderness to toenail with slight erythema, will treat with antibiotics and refer to podiatry.   Orders: -     Ambulatory referral to Podiatry -     Cephalexin; Take 1 capsule (500 mg total) by mouth 4 (four) times daily for 10 days.  Dispense: 40 capsule; Refill: 0  Discoloration of nailbeds -     Ambulatory referral to Podiatry  Need for influenza vaccination Assessment & Plan: Influenza vaccine administered Encouraged to take Tylenol as needed for fever or muscle aches.   Orders: -     Flu vaccine trivalent PF, 6mos and older(Flulaval,Afluria,Fluarix,Fluzone)  Class 3 severe obesity due to excess calories with serious comorbidity and body mass index (BMI) of 45.0 to 49.9 in adult [facility]) Assessment & Plan: he is encouraged to strive for BMI less than 30 to decrease cardiac risk. Advised to aim for at least 150 minutes of exercise per week.   Orders: -     Wegovy; Inject 1 mg into the skin once a week.  Dispense: 2 mL; Refill: 2  OSA on CPAP Assessment & Plan: Usage and benefits of CPAP.      No follow-ups on file.  Patient was given opportunity to ask questions. Patient verbalized understanding of the plan and was able to repeat key elements of the plan. All questions were answered to their satisfaction.    Jeanell Sparrow, FNP, have reviewed all documentation for this visit. The documentation on 08/02/23 for the exam, diagnosis, procedures, and orders are all accurate and complete.   IF YOU HAVE BEEN REFERRED TO A SPECIALIST, IT MAY TAKE 1-2 WEEKS TO SCHEDULE/PROCESS THE REFERRAL. IF YOU HAVE NOT HEARD FROM US/SPECIALIST IN TWO WEEKS, PLEASE GIVE [place] A CALL AT [phone].

## 2023-08-02 NOTE — Assessment & Plan Note (Signed)
Refilled naproxyn and advised to contact [facility] about his injections

## 2023-08-03 ENCOUNTER — Other Ambulatory Visit: Payer: Self-pay | Admitting: Physical Medicine and Rehabilitation

## 2023-08-03 DIAGNOSIS — M47819 Spondylosis without myelopathy or radiculopathy, site unspecified: Secondary | ICD-10-CM

## 2023-08-03 DIAGNOSIS — M47816 Spondylosis without myelopathy or radiculopathy, lumbar region: Secondary | ICD-10-CM

## 2023-08-03 DIAGNOSIS — M545 Low back pain, unspecified: Secondary | ICD-10-CM

## 2023-08-03 LAB — CBC WITH DIFFERENTIAL/PLATELET
Basophils Absolute: 0 10*3/uL (ref 0.0–0.2)
Basos: 1 %
EOS (ABSOLUTE): 0.1 10*3/uL (ref 0.0–0.4)
Eos: 3 %
Hematocrit: 43.4 % (ref 37.5–51.0)
Hemoglobin: 13.6 g/dL (ref 13.0–17.7)
Immature Grans (Abs): 0 10*3/uL (ref 0.0–0.1)
Immature Granulocytes: 0 %
Lymphocytes Absolute: 1.3 10*3/uL (ref 0.7–3.1)
Lymphs: 34 %
MCH: 27.1 pg (ref 26.6–33.0)
MCHC: 31.3 g/dL — ABNORMAL LOW (ref 31.5–35.7)
MCV: 87 fL (ref 79–97)
Monocytes Absolute: 0.5 10*3/uL (ref 0.1–0.9)
Monocytes: 12 %
Neutrophils Absolute: 1.9 10*3/uL (ref 1.4–7.0)
Neutrophils: 50 %
Platelets: 244 10*3/uL (ref 150–450)
RBC: 5.02 x10E6/uL (ref 4.14–5.80)
RDW: 14.6 % (ref 11.6–15.4)
WBC: 3.8 10*3/uL (ref 3.4–10.8)

## 2023-08-03 LAB — BASIC METABOLIC PANEL
BUN/Creatinine Ratio: 16 (ref 9–20)
BUN: 16 mg/dL (ref 6–24)
CO2: 25 mmol/L (ref 20–29)
Calcium: 9.3 mg/dL (ref 8.7–10.2)
Chloride: 101 mmol/L (ref 96–106)
Creatinine, Ser: 0.98 mg/dL (ref 0.76–1.27)
Glucose: 87 mg/dL (ref 70–99)
Potassium: 3.9 mmol/L (ref 3.5–5.2)
Sodium: 142 mmol/L (ref 134–144)
eGFR: 94 mL/min/{1.73_m2} (ref >59)

## 2023-08-03 LAB — HEMOGLOBIN A1C
Est. average glucose Bld gHb Est-mCnc: 123 mg/dL
Hgb A1c MFr Bld: 5.9 % — ABNORMAL HIGH (ref 4.8–5.6)

## 2023-08-03 NOTE — Telephone Encounter (Signed)
Is patient supposed to have injection?

## 2023-08-10 DIAGNOSIS — L608 Other nail disorders: Secondary | ICD-10-CM | POA: Insufficient documentation

## 2023-08-10 NOTE — Assessment & Plan Note (Signed)
he is encouraged to strive for BMI less than 30 to decrease cardiac risk. Advised to aim for at least 150 minutes of exercise per week.

## 2023-08-10 NOTE — Assessment & Plan Note (Signed)
Hgba1c is stable, continue focusing on healthy diet and regular exercise

## 2023-08-10 NOTE — Assessment & Plan Note (Signed)
Blood pressure is well controlled. Continue current medications. 

## 2023-08-10 NOTE — Assessment & Plan Note (Signed)
Tenderness to toenail with slight erythema, will treat with antibiotics and refer to podiatry.

## 2023-08-10 NOTE — Assessment & Plan Note (Signed)
Influenza vaccine administered Encouraged to take Tylenol as needed for fever or muscle aches.

## 2023-08-11 ENCOUNTER — Ambulatory Visit: Payer: Managed Care, Other (non HMO) | Admitting: Physical Medicine and Rehabilitation

## 2023-08-11 ENCOUNTER — Encounter: Payer: Self-pay | Admitting: Podiatry

## 2023-08-11 ENCOUNTER — Ambulatory Visit: Payer: Managed Care, Other (non HMO) | Admitting: Podiatry

## 2023-08-11 ENCOUNTER — Other Ambulatory Visit: Payer: Self-pay

## 2023-08-11 VITALS — Ht 68.0 in | Wt 320.0 lb

## 2023-08-11 DIAGNOSIS — M47816 Spondylosis without myelopathy or radiculopathy, lumbar region: Secondary | ICD-10-CM | POA: Diagnosis not present

## 2023-08-11 DIAGNOSIS — B351 Tinea unguium: Secondary | ICD-10-CM

## 2023-08-11 MED ORDER — METHYLPREDNISOLONE ACETATE 40 MG/ML IJ SUSP
40.0000 mg | Freq: Once | INTRAMUSCULAR | Status: AC
  Administered 2023-08-11: 40 mg

## 2023-08-11 MED ORDER — TERBINAFINE HCL 250 MG PO TABS: 250.0000 mg | ORAL_TABLET | Freq: Every day | ORAL | 0 refills | Status: DC

## 2023-08-11 NOTE — Progress Notes (Unsigned)
Functional Pain Scale - descriptive words and definitions  Mild (2)   Noticeable when not distracted/no impact on ADL's/sleep only slightly affected and able to   use both passive and active distraction for comfort. Mild range order  Average Pain 2 160/100  +Driver, -BT, -Dye Allergies.

## 2023-08-11 NOTE — Patient Instructions (Signed)
VISIT SUMMARY:  During today's visit, we addressed your concerns about toe pain and discoloration. You reported significant pain in your big toe following a pedicure, which has since improved with antibiotic treatment. Additionally, you have noticed discoloration on several toenails, which we discussed in detail.  YOUR PLAN:  -ONYCHOMYCOSIS: Onychomycosis is a fungal infection of the toenails that causes them to become thickened and discolored. We will start you on Terbinafine 250mg  daily for 3 months to treat this condition. You should stop taking the current antibiotic as the bacterial infection appears to be resolved. Please return in 4 months for a follow-up visit.  -MELANONYCHIA: Melanonychia is a condition characterized by dark streaks in the nails, which is usually benign. You should monitor your nails for any changes in color, size, or spread into the skin and report any concerns promptly.  INSTRUCTIONS:  Please return in 4 months for a follow-up visit to assess the progress of your treatment for onychomycosis. If you notice any changes in your melanonychia, such as changes in color, size, or spread into the skin, contact us immediately.

## 2023-08-11 NOTE — Patient Instructions (Signed)

## 2023-08-11 NOTE — Progress Notes (Signed)
  Subjective:  Patient ID: Troy Olson, male    DOB: 06/19/1971,  MRN: [id number]  Chief Complaint  Patient presents with   Nail Problem    Patient is here for nail fungus, treated with antibiotics ( bilateral)    Discussed the use of AI scribe software for clinical note transcription with the patient, who gave verbal consent to proceed.  History of Present Illness   The patient presents with a chief complaint of toe pain and discoloration. He reports an episode of excruciating pain in the big toe following a pedicure about a week and a half ago. He suspects that the pedicurist may have dug too deep, causing irritation. He sought medical attention and was prescribed an antibiotic, which he is currently taking. The pain has since subsided, and he is now able to tolerate touch on the affected toe.  In addition to the toe pain, the patient has noticed discoloration on several toenails, which he believes may be due to a fungal infection. He reports a history of trauma to one of the toenails in childhood, which resulted in the nail falling off and growing back discolored. He has not experienced similar trauma to the other affected nails.  The patient also reports a history of melanonychia, a benign condition characterized by dark streaks in the nails. He has been advised to monitor this condition for changes in color, size, or spread into the skin, which would necessitate a biopsy.          Objective:    Physical Exam   EXTREMITIES: Brown and yellow discoloration on a few toenails suggestive of nail fungus. MUSCULOSKELETAL: Thick and elongated dystrophic nails, with the great toenail showing longitudinal melanonychia and similar changes in multiple toenails. No signs of active bacterial infection or paronychia. SKIN: Warm and well-perfused feet with palpable pulses and good capillary refill time.            Results          Assessment:   1. Onychomycosis      Plan:  Patient  was evaluated and treated and all questions answered.  Assessment and Plan    Onychomycosis   Thickened, discolored toenails with a history of trauma exhibit melanonychia on several nails, likely benign. We discussed treatment options and potential side effects. Terbinafine 250mg  daily will be started for 3 months. The current antibiotic will be stopped as the bacterial infection appears resolved. He will return in 4 months for follow-up.  Melanonychia   Noted on several toenails, likely benign. He has been advised to monitor for changes in color, size, or spread into the skin and to report any concerns promptly.          Return in about 4 months (around 12/10/2023) for follow up after nail fungus treatment.

## 2023-08-12 NOTE — Procedures (Signed)
Lumbar Facet Joint Intra-Articular Injection(s) with Fluoroscopic Guidance  Patient: Troy Olson      Date of Birth: [birth year] MRN: [id number] PCP: [name], FNP      Visit Date: 08/11/2023   Universal Protocol:    Date/Time: 08/11/2023  Consent Given By: the patient  Position: PRONE   Additional Comments: Vital signs were monitored before and after the procedure. Patient was prepped and draped in the usual sterile fashion. The correct patient, procedure, and site was verified.   Injection Procedure Details:  Procedure Site One Meds Administered:  Meds ordered this encounter  Medications   methylPREDNISolone acetate (DEPO-MEDROL) injection 40 mg     Laterality: Bilateral  Location/Site:  L4-L5 L5-S1  Needle size: 22 guage  Needle type: Spinal  Needle Placement: Articular  Findings:  -Comments: Excellent flow of contrast producing a partial arthrogram.  Procedure Details: The fluoroscope beam is vertically oriented in AP, and the inferior recess is visualized beneath the lower pole of the inferior apophyseal process, which represents the target point for needle insertion. When direct visualization is difficult the target point is located at the medial projection of the vertebral pedicle. The region overlying each aforementioned target is locally anesthetized with a 1 to 2 ml. volume of 1% Lidocaine without Epinephrine.   The spinal needle was inserted into each of the above mentioned facet joints using biplanar fluoroscopic guidance. A 0.25 to 0.5 ml. volume of Isovue-250 was injected and a partial facet joint arthrogram was obtained. A single spot film was obtained of the resulting arthrogram.    One to 1.25 ml of the steroid/anesthetic solution was then injected into each of the facet joints noted above.   Additional Comments:  No complications occurred Dressing: 2 x 2 sterile gauze and Band-Aid    Post-procedure details: Patient was observed during the  procedure. Post-procedure instructions were reviewed.  Patient left the clinic in stable condition.

## 2023-08-12 NOTE — Progress Notes (Signed)
Troy Olson - 52 y.o. male MRN [id number]  Date of birth: 08-17-1971  Office Visit Note: Visit Date: 08/11/2023 PCP: [name], FNP Referred by: [name], FNP  Subjective: Chief Complaint  Patient presents with   Lower Back - Pain   HPI:  Troy Olson is a 52 y.o. male who comes in today at the request of Ellin Goodie, FNP for planned Bilateral  L4-5, L5-S1 Lumbar facet/medial branch block with fluoroscopic guidance.  The patient has failed conservative care including home exercise, medications, time and activity modification.  This injection will be diagnostic and hopefully therapeutic.  Please see requesting physician notes for further details and justification.  Exam has shown concordant pain with facet joint loading.   ROS Otherwise per HPI.  Assessment & Plan: Visit Diagnoses:    ICD-10-CM   1. Spondylosis without myelopathy or radiculopathy, lumbar region  M47.816 methylPREDNISolone acetate (DEPO-MEDROL) injection 40 mg    XR C-ARM NO REPORT    Facet Injection      Plan: No additional findings.   Meds & Orders:  Meds ordered this encounter  Medications   methylPREDNISolone acetate (DEPO-MEDROL) injection 40 mg    Orders Placed This Encounter  Procedures   Facet Injection   XR C-ARM NO REPORT    Follow-up: Return for visit to requesting provider as needed.   Procedures: No procedures performed  Lumbar Facet Joint Intra-Articular Injection(s) with Fluoroscopic Guidance  Patient: Troy Olson      Date of Birth: 1971/07/19 MRN: [id number] PCP: [name], FNP      Visit Date: 08/11/2023   Universal Protocol:    Date/Time: 08/11/2023  Consent Given By: the patient  Position: PRONE   Additional Comments: Vital signs were monitored before and after the procedure. Patient was prepped and draped in the usual sterile fashion. The correct patient, procedure, and site was verified.   Injection Procedure Details:  Procedure Site One Meds  Administered:  Meds ordered this encounter  Medications   methylPREDNISolone acetate (DEPO-MEDROL) injection 40 mg     Laterality: Bilateral  Location/Site:  L4-L5 L5-S1  Needle size: 22 guage  Needle type: Spinal  Needle Placement: Articular  Findings:  -Comments: Excellent flow of contrast producing a partial arthrogram.  Procedure Details: The fluoroscope beam is vertically oriented in AP, and the inferior recess is visualized beneath the lower pole of the inferior apophyseal process, which represents the target point for needle insertion. When direct visualization is difficult the target point is located at the medial projection of the vertebral pedicle. The region overlying each aforementioned target is locally anesthetized with a 1 to 2 ml. volume of 1% Lidocaine without Epinephrine.   The spinal needle was inserted into each of the above mentioned facet joints using biplanar fluoroscopic guidance. A 0.25 to 0.5 ml. volume of Isovue-250 was injected and a partial facet joint arthrogram was obtained. A single spot film was obtained of the resulting arthrogram.    One to 1.25 ml of the steroid/anesthetic solution was then injected into each of the facet joints noted above.   Additional Comments:  No complications occurred Dressing: 2 x 2 sterile gauze and Band-Aid    Post-procedure details: Patient was observed during the procedure. Post-procedure instructions were reviewed.  Patient left the clinic in stable condition.     Clinical History: Narrative & Impression CLINICAL DATA:  Low back pain for 1 month.   EXAM: LUMBAR SPINE - COMPLETE 4+ VIEW   COMPARISON:  May 03, 2020   FINDINGS: There is no evidence of lumbar spine fracture. Curvature of spine. Narrow intervertebral space noted at L4-5. Facet joint sclerosis noted at L4-5. Mild anterior spurring noted throughout lumbar spine.   IMPRESSION: Degenerative joint changes of lumbar spine. Progressed compared  to prior exam.     Electronically Signed   By: Sherian Rein M.D.   On: 04/27/2023 12:11     Objective:  VS:  HT:    WT:   BMI:     BP:   HR: bpm  TEMP: ( )  RESP:  Physical Exam Vitals and nursing note reviewed.  Constitutional:      General: He is not in acute distress.    Appearance: Normal appearance. He is obese. He is not ill-appearing.  HENT:     Head: Normocephalic and atraumatic.     Right Ear: External ear normal.     Left Ear: External ear normal.     Nose: No congestion.  Eyes:     Extraocular Movements: Extraocular movements intact.  Cardiovascular:     Rate and Rhythm: Normal rate.     Pulses: Normal pulses.  Pulmonary:     Effort: Pulmonary effort is normal. No respiratory distress.  Abdominal:     General: There is no distension.     Palpations: Abdomen is soft.  Musculoskeletal:        General: No tenderness or signs of injury.     Cervical back: Neck supple.     Right lower leg: No edema.     Left lower leg: No edema.     Comments: Patient has good distal strength without clonus.  Skin:    Findings: No erythema or rash.  Neurological:     General: No focal deficit present.     Mental Status: He is alert and oriented to person, place, and time.     Sensory: No sensory deficit.     Motor: No weakness or abnormal muscle tone.     Coordination: Coordination normal.  Psychiatric:        Mood and Affect: Mood normal.        Behavior: Behavior normal.      Imaging: XR C-ARM NO REPORT  Result Date: 08/11/2023 Please see Notes tab for imaging impression.

## 2023-09-14 ENCOUNTER — Encounter: Payer: Self-pay | Admitting: Podiatry

## 2023-09-15 MED ORDER — TERBINAFINE HCL 250 MG PO TABS: 250.0000 mg | ORAL_TABLET | Freq: Every day | ORAL | 0 refills | Status: AC

## 2023-10-09 ENCOUNTER — Other Ambulatory Visit: Payer: Self-pay | Admitting: Nurse Practitioner

## 2023-12-08 ENCOUNTER — Encounter: Payer: Self-pay | Admitting: Podiatry

## 2023-12-08 ENCOUNTER — Ambulatory Visit: Payer: Managed Care, Other (non HMO) | Admitting: Podiatry

## 2023-12-08 VITALS — Ht 68.0 in | Wt 320.0 lb

## 2023-12-08 DIAGNOSIS — B351 Tinea unguium: Secondary | ICD-10-CM

## 2023-12-08 NOTE — Progress Notes (Signed)
  Subjective:  Patient ID: Troy Olson, male    DOB: March 26, 1971,  MRN: [id number]  Chief Complaint  Patient presents with   Nail Problem    Pt is here to f/u on bilateral toenail fungus, pt states that he discontinue the meds after seeing results, states that '' it's like night and day'' no other complaints.    53 y.o. male presents with the above complaint. History confirmed with patient.   Objective:  Physical Exam: warm, good capillary refill, no trophic changes or ulcerative lesions, normal DP and PT pulses, normal sensory exam, and onychomycosis of multiple toenails with 50% clearance. Visit Assessment:   1. Onychomycosis      Plan:  Patient was evaluated and treated and all questions answered.  Doing well has good proximal clearance.  Thinks he took probably about 60 days worth.  Do not think he needs further antifungal therapy at this point.  He will continue to monitor I recommended he take photos monthly and evaluate at the beginning of summer if he sees any proximal spread of the onychomycosis and discoloration to let me know and we will plan to treat again with another 90-day course.  Follow-up with me as needed. Return if symptoms worsen or fail to improve.

## 2024-01-26 ENCOUNTER — Other Ambulatory Visit: Payer: Self-pay | Admitting: Physical Medicine and Rehabilitation

## 2024-01-26 ENCOUNTER — Telehealth: Payer: Self-pay

## 2024-01-26 DIAGNOSIS — M47816 Spondylosis without myelopathy or radiculopathy, lumbar region: Secondary | ICD-10-CM

## 2024-01-26 DIAGNOSIS — M545 Low back pain, unspecified: Secondary | ICD-10-CM

## 2024-01-26 NOTE — Telephone Encounter (Signed)
 80% relief/function ability 6 months of relief/improvement Current pais score--8 recent falls or injuries--None Location of pain- Lower back.

## 2024-02-06 ENCOUNTER — Other Ambulatory Visit: Payer: Self-pay | Admitting: Nurse Practitioner

## 2024-02-06 DIAGNOSIS — I10 Essential (primary) hypertension: Secondary | ICD-10-CM

## 2024-02-10 ENCOUNTER — Encounter: Admitting: Physical Medicine and Rehabilitation

## 2024-02-21 ENCOUNTER — Other Ambulatory Visit: Payer: Self-pay

## 2024-02-21 ENCOUNTER — Ambulatory Visit: Admitting: Physical Medicine and Rehabilitation

## 2024-02-21 VITALS — BP 156/96 | HR 79

## 2024-02-21 DIAGNOSIS — M47816 Spondylosis without myelopathy or radiculopathy, lumbar region: Secondary | ICD-10-CM | POA: Diagnosis not present

## 2024-02-21 MED ORDER — METHYLPREDNISOLONE ACETATE 40 MG/ML IJ SUSP
40.0000 mg | Freq: Once | INTRAMUSCULAR | Status: AC
  Administered 2024-02-21: 40 mg

## 2024-02-21 NOTE — Progress Notes (Signed)
 Troy Olson - 53 y.o. male MRN [id number]  Date of birth: 1971-01-17  Office Visit Note: Visit Date: 02/21/2024 PCP: [name], FNP Referred by: [name], FNP  Subjective: Chief Complaint  Patient presents with   Lower Back - Pain   HPI: Troy Olson is a 53 y.o. male who comes in today for planned repeat Bilateral L4-5 and L5-S1 Lumbar facet/medial branch block with fluoroscopic guidance.  The patient has failed conservative care including home exercise, medications, time and activity modification.  This injection will be diagnostic and hopefully therapeutic.  Please see requesting physician notes for further details and justification.  Exam shows concordant low back pain with facet joint loading and extension. Patient received more than 80% pain relief from prior injection.    Referring:Megan Broadus Canes, FNP     ROS Otherwise per HPI.  Assessment & Plan: Visit Diagnoses:    ICD-10-CM   1. Spondylosis without myelopathy or radiculopathy, lumbar region  M47.816 XR C-ARM NO REPORT    Facet Injection    methylPREDNISolone  acetate (DEPO-MEDROL ) injection 40 mg       Plan: No additional findings.   Meds & Orders:  Meds ordered this encounter  Medications   methylPREDNISolone  acetate (DEPO-MEDROL ) injection 40 mg    Orders Placed This Encounter  Procedures   Facet Injection   XR C-ARM NO REPORT    Follow-up: Return if symptoms worsen or fail to improve.   Procedures: No procedures performed  Lumbar Facet Joint Intra-Articular Injection(s) with Fluoroscopic Guidance  Patient: Troy Olson      Date of Birth: 1971/04/10 MRN: [id number] PCP: [name], FNP      Visit Date: 02/21/2024   Universal Protocol:    Date/Time: 02/21/2024  Consent Given By: the patient  Position: PRONE   Additional Comments: Vital signs were monitored before and after the procedure. Patient was prepped and draped in the usual sterile fashion. The correct patient,  procedure, and site was verified.   Injection Procedure Details:  Procedure Site One Meds Administered:  Meds ordered this encounter  Medications   methylPREDNISolone  acetate (DEPO-MEDROL ) injection 40 mg     Laterality: Bilateral  Location/Site:  L4-L5 L5-S1  Needle size: 22 guage  Needle type: Spinal  Needle Placement: Articular  Findings:  -Comments: Excellent flow of contrast producing a partial arthrogram.  Procedure Details: The fluoroscope beam is vertically oriented in AP, and the inferior recess is visualized beneath the lower pole of the inferior apophyseal process, which represents the target point for needle insertion. When direct visualization is difficult the target point is located at the medial projection of the vertebral pedicle. The region overlying each aforementioned target is locally anesthetized with a 1 to 2 ml. volume of 1% Lidocaine without Epinephrine .   The spinal needle was inserted into each of the above mentioned facet joints using biplanar fluoroscopic guidance. A 0.25 to 0.5 ml. volume of Isovue -250 was injected and a partial facet joint arthrogram was obtained. A single spot film was obtained of the resulting arthrogram.    One to 1.25 ml of the steroid/anesthetic solution was then injected into each of the facet joints noted above.   Additional Comments:  The patient tolerated the procedure well Dressing: 2 x 2 sterile gauze and Band-Aid    Post-procedure details: Patient was observed during the procedure. Post-procedure instructions were reviewed.  Patient left the clinic in stable condition.    Clinical History: Narrative & Impression CLINICAL DATA:  Low back pain  for 1 month.   EXAM: LUMBAR SPINE - COMPLETE 4+ VIEW   COMPARISON:  May 03, 2020   FINDINGS: There is no evidence of lumbar spine fracture. Curvature of spine. Narrow intervertebral space noted at L4-5. Facet joint sclerosis noted at L4-5. Mild anterior spurring  noted throughout lumbar spine.   IMPRESSION: Degenerative joint changes of lumbar spine. Progressed compared to prior exam.     Electronically Signed   By: Anna Barnes M.D.   On: 04/27/2023 12:11   He reports that he has been smoking cigars and cigarettes. He started smoking about 32 years ago. He has a 10 pack-year smoking history. He has never used smokeless tobacco.  Recent Labs    08/02/23 1110 02/22/24 1506  HGBA1C 5.9* 5.6    Objective:  VS:  HT:    WT:   BMI:     BP:(!) 156/96  HR:79bpm  TEMP: ( )  RESP:  Physical Exam Vitals and nursing note reviewed.  Constitutional:      General: He is not in acute distress.    Appearance: Normal appearance. He is obese. He is not ill-appearing.  HENT:     Head: Normocephalic and atraumatic.     Right Ear: External ear normal.     Left Ear: External ear normal.     Nose: No congestion.  Eyes:     Extraocular Movements: Extraocular movements intact.  Cardiovascular:     Rate and Rhythm: Normal rate.     Pulses: Normal pulses.  Pulmonary:     Effort: Pulmonary effort is normal. No respiratory distress.  Abdominal:     General: There is no distension.     Palpations: Abdomen is soft.  Musculoskeletal:        General: Tenderness present. No signs of injury.     Cervical back: Neck supple.     Right lower leg: No edema.     Left lower leg: No edema.     Comments: Patient has good distal strength without clonus. Patient somewhat slow to rise from a seated position to full extension.  There is concordant low back pain with facet loading and lumbar spine extension rotation.  There are no definitive trigger points but the patient is somewhat tender across the lower back and PSIS.  There is no pain with hip rotation.   Skin:    Findings: No erythema or rash.  Neurological:     General: No focal deficit present.     Mental Status: He is alert and oriented to person, place, and time.     Cranial Nerves: No cranial nerve  deficit.     Sensory: No sensory deficit.     Motor: No weakness or abnormal muscle tone.     Coordination: Coordination normal.     Gait: Gait abnormal.  Psychiatric:        Mood and Affect: Mood normal.        Behavior: Behavior normal.     Ortho Exam  Imaging: No results found.  Past Medical/Family/Surgical/Social History: Medications & Allergies reviewed per EMR, new medications updated. Patient Active Problem List   Diagnosis Date Noted   Herpes zoster vaccination declined 02/22/2024   Discoloration of nailbeds 08/10/2023   Need for influenza vaccination 08/02/2023   Pain of toe of left foot 08/02/2023   Chronic midline low back pain without sciatica 08/02/2023   OSA on CPAP 08/02/2023   Abdominal hernia without obstruction and without gangrene 04/27/2023   Pain in abdomen on  palpation 04/27/2023   Acute bilateral low back pain without sciatica 04/27/2023   Class 3 severe obesity due to excess calories with serious comorbidity and body mass index (BMI) of 45.0 to 49.9 in adult 04/27/2023   Essential hypertension 04/27/2023   Uncontrolled hypertension 01/28/2023   Prediabetes    Asthma in adult without complication 01/24/2019   Moderate persistent asthma 03/16/2012   Past Medical History:  Diagnosis Date   Abscess of axilla, left 10/12/2017   Allergies    Allergy    Arthritis    Asthma    Back pain    Edema of both lower extremities    Food allergy    HLD (hyperlipidemia)    Hypertension    Morbid obesity (HCC)    Prediabetes    Sleep apnea    CPAP   Family History  Problem Relation Age of Onset   Hypertension Father    Heart disease Father        stents placed   Cancer Sister    Ovarian cancer Sister        male cancer ? if ovaries   Asthma Son    Colon cancer Neg Hx    Esophageal cancer Neg Hx    Stomach cancer Neg Hx    Rectal cancer Neg Hx    Past Surgical History:  Procedure Laterality Date   COLONOSCOPY  01/27/2021   Normal   MOUTH  SURGERY     widsom teeth   Social History   Occupational History   Occupation: Truck driver  Tobacco Use   Smoking status: Some Days    Current packs/day: 0.00    Average packs/day: 0.5 packs/day for 20.0 years (10.0 ttl pk-yrs)    Types: Cigars, Cigarettes    Start date: 03/13/1991    Last attempt to quit: 03/13/2011    Years since quitting: 12.9   Smokeless tobacco: Never   Tobacco comments:    Smokes cigars occasionally.  Vaping Use   Vaping status: Never Used  Substance and Sexual Activity   Alcohol use: Yes    Comment: occasionally--dark liquer 4-5 drinks consumed weekly   Drug use: No   Sexual activity: Yes    Birth control/protection: None

## 2024-02-21 NOTE — Progress Notes (Signed)
 Del Favia, CMA,acting as a Neurosurgeon for Troy Epley, FNP.,have documented all relevant documentation on the behalf of Troy Epley, FNP,as directed by  Troy Epley, FNP while in the presence of Troy Epley, FNP.  Subjective:  Patient ID: Troy Olson , male    DOB: 1971/03/20 , 53 y.o.   MRN: [id number]  Chief Complaint  Patient presents with   Hypertension    Patient presents today for a bp and pre dm follow up, Patient reports compliance with medication. Patient denies any chest pain, SOB, or headaches. Patient has no concerns today.     HPI  He went to the dermatologist due to having a boil to his left inner thigh and given two antibiotics   He also went to orthopedics and had steroid injections on Monday.  He does not have a taste for the cakes and sweets. He is trying to drink more water.   He is taking Zepbound through [employer].  He continues to use his CPAP and has received his new one.  He is walking a little bit, his work hours are extended. He is doing more gardening.     Past Medical History:  Diagnosis Date   Abscess of axilla, left 10/12/2017   Allergies    Allergy    Arthritis    Asthma    Back pain    Edema of both lower extremities    Food allergy    HLD (hyperlipidemia)    Hypertension    Morbid obesity (HCC)    Prediabetes    Sleep apnea    CPAP     Family History  Problem Relation Age of Onset   Hypertension Father    Heart disease Father        stents placed   Cancer Sister    Ovarian cancer Sister        male cancer ? if ovaries   Asthma Son    Colon cancer Neg Hx    Esophageal cancer Neg Hx    Stomach cancer Neg Hx    Rectal cancer Neg Hx      Current Outpatient Medications:    albuterol  (VENTOLIN  HFA) 108 (90 Base) MCG/ACT inhaler, Inhale 2 puffs into the lungs every 6 (six) hours as needed. wheezing, Disp: 1 each, Rfl: 5   benzonatate (TESSALON) 200 MG capsule, Take 200 mg by mouth 3 (three) times daily as needed.,  Disp: , Rfl:    Blood Pressure Monitoring (BLOOD PRESSURE MONITOR/L CUFF) MISC, Use to check blood pressure, Disp: 1 each, Rfl: 0   cetirizine (ZYRTEC) 10 MG tablet, Take 10 mg by mouth daily., Disp: , Rfl:    clobetasol  cream (TEMOVATE ) 0.05 %, Apply 1 Application topically 2 (two) times daily., Disp: 60 g, Rfl: 3   EPINEPHrine  (EPIPEN  2-PAK) 0.3 mg/0.3 mL IJ SOAJ injection, Inject 0.3 mg into the muscle as needed for anaphylaxis., Disp: 1 each, Rfl: 0   hydrochlorothiazide  (MICROZIDE ) 12.5 MG capsule, TAKE 1 CAPSULE BY MOUTH TWICE  DAILY, Disp: 180 capsule, Rfl: 3   ipratropium-albuterol  (DUONEB) 0.5-2.5 (3) MG/3ML SOLN, Take 3 mLs by nebulization every 4 (four) hours as needed for up to 14 days., Disp: 180 mL, Rfl: 0   Magnesium  250 MG TABS, Take 1 tablet (250 mg total) by mouth daily. With evening meal, Disp: 30 tablet, Rfl: 0   metFORMIN  (GLUCOPHAGE ) 1000 MG tablet, TAKE 1 TABLET BY MOUTH TWICE  DAILY WITH A MEAL, Disp: 180 tablet, Rfl: 3   montelukast  (  SINGULAIR ) 10 MG tablet, TAKE 1 TABLET BY MOUTH DAILY, Disp: 90 tablet, Rfl: 3   naproxen  (NAPROSYN ) 500 MG tablet, Take 1 tablet (500 mg total) by mouth 2 (two) times daily with a meal., Disp: 30 tablet, Rfl: 0   pravastatin  (PRAVACHOL ) 40 MG tablet, TAKE 1 TABLET BY MOUTH IN THE  EVENING, Disp: 90 tablet, Rfl: 3   ZEPBOUND 7.5 MG/0.5ML Pen, SMARTSIG:7.5 Milligram(s) SUB-Q Once a Week, Disp: , Rfl:    amLODipine  (NORVASC ) 2.5 MG tablet, Take 1 tablet (2.5 mg total) by mouth daily., Disp: 90 tablet, Rfl: 1   Allergies  Allergen Reactions   Food Allergy Formula Shortness Of Breath and Swelling    Any nuts of any kind.   Peanut-Containing Drug Products Shortness Of Breath, Diarrhea, Nausea And Vomiting and Swelling     Review of Systems  Constitutional: Negative.   HENT: Negative.    Eyes: Negative.   Respiratory: Negative.    Cardiovascular: Negative.   Gastrointestinal: Negative.   Psychiatric/Behavioral: Negative.       Today's  Vitals   02/22/24 1424 02/22/24 1500  BP: (!) 120/100 (!) 120/90  Pulse: 79   Temp: 98.1 F (36.7 C)   TempSrc: Oral   Weight: 298 lb 6.4 oz (135.4 kg)   Height: 5\' 8"  (1.727 m)   PainSc: 0-No pain    Body mass index is 45.37 kg/m.  Wt Readings from Last 3 Encounters:  02/22/24 298 lb 6.4 oz (135.4 kg)  12/08/23 (!) 320 lb (145.2 kg)  08/11/23 (!) 320 lb (145.2 kg)     Objective:  Physical Exam Vitals and nursing note reviewed.  Constitutional:      General: He is not in acute distress.    Appearance: Normal appearance. He is obese.  Pulmonary:     Effort: Pulmonary effort is normal. No respiratory distress.     Breath sounds: No wheezing.  Skin:    General: Skin is warm.     Capillary Refill: Capillary refill takes less than 2 seconds.  Neurological:     General: No focal deficit present.     Mental Status: He is alert and oriented to person, place, and time.     Cranial Nerves: No cranial nerve deficit.     Motor: No weakness.  Psychiatric:        Mood and Affect: Mood normal.        Behavior: Behavior normal.        Thought Content: Thought content normal.        Judgment: Judgment normal.         Assessment And Plan:  Essential hypertension Assessment & Plan: Blood pressure is better controlled, had to repeat and was improved. Continue current medications  Orders: -     BMP8+eGFR  Prediabetes Assessment & Plan: Hgba1c is stable, continue focusing on healthy diet and regular exercise  Orders: -     Hemoglobin A1c  Uncontrolled hypertension Assessment & Plan: Blood pressure is better controlled, had to repeat and was improved. Continue current medications  Orders: -     amLODIPine  Besylate; Take 1 tablet (2.5 mg total) by mouth daily.  Dispense: 90 tablet; Refill: 1  Herpes zoster vaccination declined Assessment & Plan: Declines shingrix, educated on disease process and is aware if he changes his mind to notify office    Class 3 severe  obesity due to excess calories with serious comorbidity and body mass index (BMI) of 45.0 to 49.9 in adult Assessment & Plan:  He is encouraged to strive for BMI less than 30 to decrease cardiac risk. Advised to aim for at least 150 minutes of exercise per week. He is doing well with his weight loss. He is taking Zepbound through [employer].     OSA on CPAP Assessment & Plan: Continue wearing CPAP and doing well.    Encounter for lipid screening for cardiovascular disease -     Lipid panel    Return for 6 month bp check.  Patient was given opportunity to ask questions. Patient verbalized understanding of the plan and was able to repeat key elements of the plan. All questions were answered to their satisfaction.   Inge Mangle, FNP, have reviewed all documentation for this visit. The documentation on 02/22/24 for the exam, diagnosis, procedures, and orders are all accurate and complete.    IF YOU HAVE BEEN REFERRED TO A SPECIALIST, IT MAY TAKE 1-2 WEEKS TO SCHEDULE/PROCESS THE REFERRAL. IF YOU HAVE NOT HEARD FROM US /SPECIALIST IN TWO WEEKS, PLEASE GIVE US  A CALL AT [phone].

## 2024-02-21 NOTE — Progress Notes (Signed)
 Pain Scale   Average Pain 5 Patient advising he's lower back pain come and goes, patient states on occ the lower back pain is intense. Patient advising his pain is better today.        +Driver, -BT, -Dye Allergies.

## 2024-02-21 NOTE — Patient Instructions (Signed)

## 2024-02-22 ENCOUNTER — Ambulatory Visit: Payer: Self-pay | Admitting: Nurse Practitioner

## 2024-02-22 ENCOUNTER — Encounter: Payer: Self-pay | Admitting: Nurse Practitioner

## 2024-02-22 VITALS — BP 120/90 | HR 79 | Temp 98.1°F | Ht 68.0 in | Wt 298.4 lb

## 2024-02-22 DIAGNOSIS — G4733 Obstructive sleep apnea (adult) (pediatric): Secondary | ICD-10-CM

## 2024-02-22 DIAGNOSIS — E66813 Obesity, class 3: Secondary | ICD-10-CM

## 2024-02-22 DIAGNOSIS — Z136 Encounter for screening for cardiovascular disorders: Secondary | ICD-10-CM

## 2024-02-22 DIAGNOSIS — I10 Essential (primary) hypertension: Secondary | ICD-10-CM

## 2024-02-22 DIAGNOSIS — R7303 Prediabetes: Secondary | ICD-10-CM

## 2024-02-22 DIAGNOSIS — Z13220 Encounter for screening for lipoid disorders: Secondary | ICD-10-CM

## 2024-02-22 DIAGNOSIS — Z6841 Body Mass Index (BMI) 40.0 and over, adult: Secondary | ICD-10-CM

## 2024-02-22 DIAGNOSIS — Z2821 Immunization not carried out because of patient refusal: Secondary | ICD-10-CM

## 2024-02-22 MED ORDER — AMLODIPINE BESYLATE 2.5 MG PO TABS: 2.5000 mg | ORAL_TABLET | Freq: Every day | ORAL | 1 refills | Status: DC

## 2024-02-23 LAB — BMP8+EGFR
BUN/Creatinine Ratio: 11 (ref 9–20)
BUN: 11 mg/dL (ref 6–24)
CO2: 25 mmol/L (ref 20–29)
Calcium: 9.4 mg/dL (ref 8.7–10.2)
Chloride: 100 mmol/L (ref 96–106)
Creatinine, Ser: 1 mg/dL (ref 0.76–1.27)
Glucose: 74 mg/dL (ref 70–99)
Potassium: 4.1 mmol/L (ref 3.5–5.2)
Sodium: 140 mmol/L (ref 134–144)
eGFR: 92 mL/min/{1.73_m2} (ref >59)

## 2024-02-23 LAB — LIPID PANEL
Chol/HDL Ratio: 3.5 ratio (ref 0.0–5.0)
Cholesterol, Total: 184 mg/dL (ref 100–199)
HDL: 52 mg/dL (ref >39)
LDL Chol Calc (NIH): 121 mg/dL — ABNORMAL HIGH (ref 0–99)
Triglycerides: 56 mg/dL (ref 0–149)
VLDL Cholesterol Cal: 11 mg/dL (ref 5–40)

## 2024-02-23 LAB — HEMOGLOBIN A1C
Est. average glucose Bld gHb Est-mCnc: 114 mg/dL
Hgb A1c MFr Bld: 5.6 % (ref 4.8–5.6)

## 2024-02-28 NOTE — Procedures (Signed)
 Lumbar Facet Joint Intra-Articular Injection(s) with Fluoroscopic Guidance  Patient: Troy Olson      Date of Birth: 15-Oct-1970 MRN: [id number] PCP: [name], FNP      Visit Date: 02/21/2024   Universal Protocol:    Date/Time: 02/21/2024  Consent Given By: the patient  Position: PRONE   Additional Comments: Vital signs were monitored before and after the procedure. Patient was prepped and draped in the usual sterile fashion. The correct patient, procedure, and site was verified.   Injection Procedure Details:  Procedure Site One Meds Administered:  Meds ordered this encounter  Medications   methylPREDNISolone  acetate (DEPO-MEDROL ) injection 40 mg     Laterality: Bilateral  Location/Site:  L4-L5 L5-S1  Needle size: 22 guage  Needle type: Spinal  Needle Placement: Articular  Findings:  -Comments: Excellent flow of contrast producing a partial arthrogram.  Procedure Details: The fluoroscope beam is vertically oriented in AP, and the inferior recess is visualized beneath the lower pole of the inferior apophyseal process, which represents the target point for needle insertion. When direct visualization is difficult the target point is located at the medial projection of the vertebral pedicle. The region overlying each aforementioned target is locally anesthetized with a 1 to 2 ml. volume of 1% Lidocaine without Epinephrine .   The spinal needle was inserted into each of the above mentioned facet joints using biplanar fluoroscopic guidance. A 0.25 to 0.5 ml. volume of Isovue -250 was injected and a partial facet joint arthrogram was obtained. A single spot film was obtained of the resulting arthrogram.    One to 1.25 ml of the steroid/anesthetic solution was then injected into each of the facet joints noted above.   Additional Comments:  The patient tolerated the procedure well Dressing: 2 x 2 sterile gauze and Band-Aid    Post-procedure details: Patient was  observed during the procedure. Post-procedure instructions were reviewed.  Patient left the clinic in stable condition.

## 2024-03-05 ENCOUNTER — Ambulatory Visit: Payer: Self-pay | Admitting: Nurse Practitioner

## 2024-03-05 NOTE — Assessment & Plan Note (Signed)
 He is encouraged to strive for BMI less than 30 to decrease cardiac risk. Advised to aim for at least 150 minutes of exercise per week. He is doing well with his weight loss. He is taking Zepbound through [employer].

## 2024-03-05 NOTE — Assessment & Plan Note (Signed)
 Hgba1c is stable, continue focusing on healthy diet and regular exercise

## 2024-03-05 NOTE — Assessment & Plan Note (Signed)
 Declines shingrix, educated on disease process and is aware if he changes his mind to notify office

## 2024-03-05 NOTE — Assessment & Plan Note (Signed)
 Blood pressure is better controlled, had to repeat and was improved. Continue current medications

## 2024-03-05 NOTE — Assessment & Plan Note (Signed)
 Continue wearing CPAP and doing well.

## 2024-04-10 ENCOUNTER — Other Ambulatory Visit: Payer: Self-pay

## 2024-04-10 ENCOUNTER — Encounter: Payer: Self-pay | Admitting: Nurse Practitioner

## 2024-04-10 ENCOUNTER — Ambulatory Visit: Admitting: Nurse Practitioner

## 2024-04-10 VITALS — BP 130/70 | HR 90 | Temp 98.0°F | Ht 68.0 in | Wt 293.6 lb

## 2024-04-10 DIAGNOSIS — M545 Low back pain, unspecified: Secondary | ICD-10-CM

## 2024-04-10 DIAGNOSIS — T753XXA Motion sickness, initial encounter: Secondary | ICD-10-CM | POA: Diagnosis not present

## 2024-04-10 DIAGNOSIS — Z91018 Allergy to other foods: Secondary | ICD-10-CM | POA: Insufficient documentation

## 2024-04-10 DIAGNOSIS — R238 Other skin changes: Secondary | ICD-10-CM | POA: Diagnosis not present

## 2024-04-10 MED ORDER — SCOPOLAMINE 1 MG/3DAYS TD PT72: 1.0000 | MEDICATED_PATCH | TRANSDERMAL | Status: DC

## 2024-04-10 MED ORDER — SCOPOLAMINE 1 MG/3DAYS TD PT72: 1.0000 | MEDICATED_PATCH | TRANSDERMAL | 0 refills | Status: DC

## 2024-04-10 MED ORDER — NAPROXEN 500 MG PO TABS: 500.0000 mg | ORAL_TABLET | Freq: Two times a day (BID) | ORAL | 0 refills | Status: AC

## 2024-04-10 MED ORDER — EPINEPHRINE 0.3 MG/0.3ML IJ SOAJ: 0.3000 mg | INTRAMUSCULAR | 5 refills | Status: AC | PRN

## 2024-04-10 NOTE — Progress Notes (Signed)
 LILLETTE Kristeen JINNY Gladis, CMA,acting as a Neurosurgeon for Gaines Ada, FNP.,have documented all relevant documentation on the behalf of Gaines Ada, FNP,as directed by  Gaines Ada, FNP while in the presence of Gaines Ada, FNP.  Subjective:  Patient ID: Troy Olson , male    DOB: [birth year] , 53 y.o.   MRN: [id number]  Chief Complaint  Patient presents with   leg abscess     Patient reports he was told he had a leg abscess in March. Patient reports he went to urgent care they attempted to treat with antibiotics but it didn't help they recommended a dermatologist.    Allergy Testing    Patient would also like a allergy test he reports he is having a lot of skin changes.    Kinetosis    Patient reports he is going deep sea fishing and needs something for the motion sickness. He reports he has tried OTC meds but it didn't help.     HPI  Here for referral to Dermatology due to abscess to left thigh in March - treated with an antibiotic but is unsure of the name. , went to Urgent care ([facility] in [place]). He was seen at [facility] 2 weeks ago, he was diagnosed with HS - he felt the provider was not trying to help at that time. He is requesting a food allergy test to decrease chance of HS flare. He went to [place] and had a sac and still leaking.     Past Medical History:  Diagnosis Date   Abscess of axilla, left 10/12/2017   Allergies    Allergy    Arthritis    Asthma    Back pain    Edema of both lower extremities    Food allergy    HLD (hyperlipidemia)    Hypertension    Morbid obesity (HCC)    Prediabetes    Sleep apnea    CPAP     Family History  Problem Relation Age of Onset   Hypertension Father    Heart disease Father        stents placed   Cancer Sister    Ovarian cancer Sister        male cancer ? if ovaries   Asthma Son    Colon cancer Neg Hx    Esophageal cancer Neg Hx    Stomach cancer Neg Hx    Rectal cancer Neg Hx      Current Outpatient  Medications:    albuterol  (VENTOLIN  HFA) 108 (90 Base) MCG/ACT inhaler, Inhale 2 puffs into the lungs every 6 (six) hours as needed. wheezing, Disp: 1 each, Rfl: 5   amLODipine  (NORVASC ) 2.5 MG tablet, Take 1 tablet (2.5 mg total) by mouth daily., Disp: 90 tablet, Rfl: 1   benzonatate (TESSALON) 200 MG capsule, Take 200 mg by mouth 3 (three) times daily as needed., Disp: , Rfl:    Blood Pressure Monitoring (BLOOD PRESSURE MONITOR/L CUFF) MISC, Use to check blood pressure, Disp: 1 each, Rfl: 0   cetirizine (ZYRTEC) 10 MG tablet, Take 10 mg by mouth daily., Disp: , Rfl:    clobetasol  cream (TEMOVATE ) 0.05 %, Apply 1 Application topically 2 (two) times daily., Disp: 60 g, Rfl: 3   hydrochlorothiazide  (MICROZIDE ) 12.5 MG capsule, TAKE 1 CAPSULE BY MOUTH TWICE  DAILY, Disp: 180 capsule, Rfl: 3   ipratropium-albuterol  (DUONEB) 0.5-2.5 (3) MG/3ML SOLN, Take 3 mLs by nebulization every 4 (four) hours as needed for up to 14 days., Disp:  180 mL, Rfl: 0   Magnesium  250 MG TABS, Take 1 tablet (250 mg total) by mouth daily. With evening meal, Disp: 30 tablet, Rfl: 0   metFORMIN  (GLUCOPHAGE ) 1000 MG tablet, TAKE 1 TABLET BY MOUTH TWICE  DAILY WITH A MEAL, Disp: 180 tablet, Rfl: 3   montelukast  (SINGULAIR ) 10 MG tablet, TAKE 1 TABLET BY MOUTH DAILY, Disp: 90 tablet, Rfl: 3   naproxen  (NAPROSYN ) 500 MG tablet, Take 1 tablet (500 mg total) by mouth 2 (two) times daily with a meal., Disp: 30 tablet, Rfl: 0   pravastatin  (PRAVACHOL ) 40 MG tablet, TAKE 1 TABLET BY MOUTH IN THE  EVENING, Disp: 90 tablet, Rfl: 3   scopolamine (TRANSDERM-SCOP) 1 MG/3DAYS, Place 1 patch (1.5 mg total) onto the skin every 3 (three) days., Disp: 4 patch, Rfl: 0   ZEPBOUND 7.5 MG/0.5ML Pen, SMARTSIG:7.5 Milligram(s) SUB-Q Once a Week, Disp: , Rfl:    EPINEPHrine  (EPIPEN  2-PAK) 0.3 mg/0.3 mL IJ SOAJ injection, Inject 0.3 mg into the muscle as needed for anaphylaxis., Disp: 1 each, Rfl: 5   Allergies  Allergen Reactions   Food Allergy  Formula Shortness Of Breath and Swelling    Any nuts of any kind.   Peanut-Containing Drug Products Shortness Of Breath, Diarrhea, Nausea And Vomiting and Swelling     Review of Systems  Constitutional: Negative.   HENT: Negative.    Eyes: Negative.   Respiratory: Negative.    Cardiovascular: Negative.   Gastrointestinal: Negative.   Skin:  Positive for rash (left thigh with healed boils with scarring).  Neurological: Negative.   Psychiatric/Behavioral: Negative.       Today's Vitals   04/10/24 0834  BP: 130/70  Pulse: 90  Temp: 98 F (36.7 C)  TempSrc: Oral  Weight: 293 lb 9.6 oz (133.2 kg)  Height: 5' 8 (1.727 m)  PainSc: 0-No pain   Body mass index is 44.64 kg/m.  Wt Readings from Last 3 Encounters:  04/10/24 293 lb 9.6 oz (133.2 kg)  02/22/24 298 lb 6.4 oz (135.4 kg)  12/08/23 (!) 320 lb (145.2 kg)     Objective:  Physical Exam Vitals and nursing note reviewed.  Constitutional:      General: He is not in acute distress.    Appearance: Normal appearance. He is obese.  Pulmonary:     Effort: Pulmonary effort is normal. No respiratory distress.     Breath sounds: No wheezing.   Skin:    General: Skin is warm and dry.     Capillary Refill: Capillary refill takes less than 2 seconds.     Findings: Rash (left inner thigh with healing boil scarring and dry skin.) present.   Neurological:     General: No focal deficit present.     Mental Status: He is alert and oriented to person, place, and time.     Cranial Nerves: No cranial nerve deficit.     Motor: No weakness.   Psychiatric:        Mood and Affect: Mood normal.        Behavior: Behavior normal.        Thought Content: Thought content normal.        Judgment: Judgment normal.      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 He is aware they are booked out to the end of  the year. Will check for any food allergies that may be contributing.   Orders: -     Ambulatory referral to Dermatology -     Allergens (94) Foods  Motion sickness, initial encounter -     Scopolamine; Place 1 patch (1.5 mg total) onto the skin every 3 (three) days.  Dispense: 4 patch; Refill: 0  Food allergy Assessment & Plan: Refill for epipen  sent.   Orders: -     EPINEPHrine ; Inject 0.3 mg into the muscle as needed for anaphylaxis.  Dispense: 1 each; Refill: 5    Return for keep same next.  Patient was given opportunity to ask questions. Patient verbalized understanding of the plan and was able to repeat key elements of the plan. All questions were answered to their satisfaction.    LILLETTE Gaines Ada, FNP, have reviewed all documentation for this visit. The documentation on 04/10/24 for the exam, diagnosis, procedures, and orders are all accurate and complete.   IF YOU HAVE BEEN REFERRED TO A SPECIALIST, IT MAY TAKE 1-2 WEEKS TO SCHEDULE/PROCESS THE REFERRAL. IF YOU HAVE NOT HEARD FROM US /SPECIALIST IN TWO WEEKS, PLEASE GIVE US  A CALL AT [phone].

## 2024-04-10 NOTE — Addendum Note (Signed)
 Addended by: GLADIS KRISTEEN PARAS on: 04/10/2024 09:22 AM   Modules accepted: Orders

## 2024-04-10 NOTE — Addendum Note (Signed)
 Addended by: GLADIS KRISTEEN PARAS on: 04/10/2024 09:24 AM   Modules accepted: Orders

## 2024-04-10 NOTE — Assessment & Plan Note (Signed)
 Refill for epipen sent

## 2024-04-10 NOTE — Assessment & Plan Note (Signed)
 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 He is aware they are booked out to the end of the year. Will check for any food allergies that may be contributing.

## 2024-04-16 ENCOUNTER — Other Ambulatory Visit: Payer: Self-pay | Admitting: Nurse Practitioner

## 2024-05-16 NOTE — Progress Notes (Unsigned)
 New Patient Note  RE: Troy Olson MRN: [id number] DOB: 10-26-1970 Date of Office Visit: 05/17/2024  Consult requested by: Georgina Speaks, FNP Primary care provider: Georgina Speaks, FNP  Chief Complaint: No chief complaint on file.  History of Present Illness: I had the pleasure of seeing Troy Olson for initial evaluation at the [facility] on 05/17/2024. He is a 53 y.o. male, who is referred here by Georgina Speaks, FNP for the evaluation of food allergies.  Discussed the use of AI scribe software for clinical note transcription with the patient, who gave verbal consent to proceed.  History of Present Illness             He reports food allergy to ***. The reaction occurred at the age of ***, after he ate *** amount of ***. Symptoms started within *** and was in the form of *** hives, swelling, wheezing, abdominal pain, diarrhea, vomiting. ***Denies any associated cofactors such as exertion, infection, NSAID use, or alcohol consumption. The symptoms lasted for ***. He was evaluated in ED and received ***. Since this episode, he does *** not report other accidental exposures to ***. He does *** not have access to epinephrine  autoinjector and *** needed to use it.   Past work up includes: ***. Dietary History: patient has been eating other foods including ***milk, ***eggs, ***peanut, ***treenuts, ***sesame, ***shellfish, ***fish, ***soy, ***wheat, ***meats, ***fruits and ***vegetables.  He reports reading labels and avoiding *** in diet completely. He tolerates ***baked egg and baked milk products.   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 He is aware they are booked out to the end of the year. Will check for any food allergies that may be contributing.   Assessment and Plan: Manjot is a 53 y.o. male  with: ***  Assessment and Plan               No follow-ups on file.  No orders of the defined types were placed in this encounter.  Lab Orders  No laboratory test(s) ordered today    Other allergy screening: Asthma: {Blank single:19197::yes,no} Rhino conjunctivitis: {Blank single:19197::yes,no} Food allergy: {Blank single:19197::yes,no} Medication allergy: {Blank single:19197::yes,no} Hymenoptera allergy: {Blank single:19197::yes,no} Urticaria: {Blank single:19197::yes,no} Eczema:{Blank single:19197::yes,no} History of recurrent infections suggestive of immunodeficency: {Blank single:19197::yes,no}  Diagnostics: Spirometry:  Tracings reviewed. His effort: {Blank single:19197::Good reproducible efforts.,It was hard to get consistent efforts and there is a question as to whether this reflects a maximal maneuver.,Poor effort, data can not be interpreted.} FVC: ***L FEV1: ***L, ***% predicted FEV1/FVC ratio: ***% Interpretation: {Blank single:19197::Spirometry consistent with mild obstructive disease,Spirometry consistent with moderate obstructive disease,Spirometry consistent with severe obstructive disease,Spirometry consistent with possible restrictive disease,Spirometry consistent with mixed obstructive and restrictive disease,Spirometry uninterpretable due to technique,Spirometry consistent with normal pattern,No overt abnormalities noted given today's efforts}.  Please see scanned spirometry results for details.  Skin Testing: {Blank single:19197::Select foods,Environmental allergy panel,Environmental allergy panel and select foods,Food allergy panel,None,Deferred due to recent antihistamines use}. *** Results discussed with patient/family.   Past Medical History: Patient Active Problem List   Diagnosis Date Noted  . Other skin changes 04/10/2024  . Motion sickness 04/10/2024  . Food allergy  04/10/2024  . Herpes zoster vaccination declined 02/22/2024  . Discoloration of nailbeds 08/10/2023  . Need for influenza vaccination 08/02/2023  . Pain of toe of left foot 08/02/2023  . Chronic midline low  back pain without sciatica 08/02/2023  . OSA on CPAP 08/02/2023  . Abdominal hernia without obstruction and without gangrene 04/27/2023  . Pain in abdomen on palpation 04/27/2023  . Acute bilateral low back pain without sciatica 04/27/2023  . Class 3 severe obesity due to excess calories with serious comorbidity and body mass index (BMI) of 45.0 to 49.9 in adult 04/27/2023  . Essential hypertension 01/28/2023  . Prediabetes   . Asthma in adult without complication 01/24/2019  . Moderate persistent asthma 03/16/2012   Past Medical History:  Diagnosis Date  . Abscess of axilla, left 10/12/2017  . Allergies   . Allergy   . Arthritis   . Asthma   . Back pain   . Edema of both lower extremities   . Food allergy   . HLD (hyperlipidemia)   . Hypertension   . Morbid obesity (HCC)   . Prediabetes   . Sleep apnea    CPAP   Past Surgical History: Past Surgical History:  Procedure Laterality Date  . COLONOSCOPY  01/27/2021   Normal  . MOUTH SURGERY     widsom teeth   Medication List:  Current Outpatient Medications  Medication Sig Dispense Refill  . albuterol  (VENTOLIN  HFA) 108 (90 Base) MCG/ACT inhaler Inhale 2 puffs into the lungs every 6 (six) hours as needed. wheezing 1 each 5  . amLODipine  (NORVASC ) 2.5 MG tablet Take 1 tablet (2.5 mg total) by mouth daily. 90 tablet 1  . benzonatate (TESSALON) 200 MG capsule Take 200 mg by mouth 3 (three) times daily as needed.    . Blood Pressure Monitoring (BLOOD PRESSURE MONITOR/L CUFF) MISC Use to check blood pressure 1 each 0  . cetirizine (ZYRTEC) 10 MG tablet Take 10 mg by mouth daily.    . clobetasol  cream (TEMOVATE ) 0.05 % Apply 1 Application topically 2 (two) times daily. 60 g 3  . EPINEPHrine  (EPIPEN  2-PAK) 0.3 mg/0.3 mL IJ  SOAJ injection Inject 0.3 mg into the muscle as needed for anaphylaxis. 1 each 5  . hydrochlorothiazide  (MICROZIDE ) 12.5 MG capsule TAKE 1 CAPSULE BY MOUTH TWICE  DAILY 180 capsule 3  . ipratropium-albuterol  (DUONEB) 0.5-2.5 (3) MG/3ML SOLN Take 3 mLs by nebulization every 4 (four) hours as needed for up to 14 days. 180 mL 0  . Magnesium  250 MG TABS Take 1 tablet (250 mg total) by mouth daily. With evening meal 30 tablet 0  . metFORMIN  (GLUCOPHAGE ) 1000 MG tablet TAKE 1 TABLET BY MOUTH TWICE  DAILY WITH A MEAL 180 tablet 3  . montelukast  (SINGULAIR ) 10 MG tablet TAKE 1 TABLET BY MOUTH DAILY 90 tablet 3  . naproxen  (NAPROSYN ) 500 MG tablet Take 1 tablet (500 mg total) by mouth 2 (two) times daily with a meal. 30 tablet 0  . pravastatin  (PRAVACHOL ) 40 MG tablet TAKE 1 TABLET BY MOUTH IN THE  EVENING 90 tablet 3  . scopolamine  (TRANSDERM-SCOP) 1 MG/3DAYS Place 1 patch (1.5 mg total) onto the skin every 3 (three) days. 4 patch 0  . ZEPBOUND 7.5 MG/0.5ML Pen SMARTSIG:7.5 Milligram(s) SUB-Q Once a Week     No current facility-administered medications for this visit.   Allergies: Allergies  Allergen Reactions  . Food Allergy Formula Shortness Of Breath and Swelling    Any nuts of any kind.  . Peanut-Containing Drug Products Shortness Of Breath, Diarrhea, Nausea And Vomiting and Swelling   Social History: Social History   Socioeconomic History  . Marital status: Married    Spouse name: Rosaline   .  Number of children: 3  . Years of education: Not on file  . Highest education level: Not on file  Occupational History  . Occupation: Truck [employer] doctor  Tobacco Use  . Smoking status: Some Days    Current packs/day: 0.00    Average packs/day: 0.5 packs/day for 20.0 years (10.0 ttl pk-yrs)    Types: Cigars, Cigarettes    Start date: 03/13/1991    Last attempt to quit: 03/13/2011    Years since quitting: 13.1  . Smokeless tobacco: Never  . Tobacco comments:    Smokes cigars occasionally.  Vaping Use   . Vaping status: Never Used  Substance and Sexual Activity  . Alcohol use: Yes    Comment: occasionally--dark liquer 4-5 drinks consumed weekly  . Drug use: No  . Sexual activity: Yes    Birth control/protection: None  Other Topics Concern  . Not on file  Social History Narrative   Patient is married with 2 sons and 1 daughter   He owns a dump truck   Former smoker   3 alcoholic beverages daily   1 caffeinated beverage daily   No drug use or other tobacco   Social Drivers of Corporate investment banker Strain: Not on file  Food Insecurity: Not on file  Transportation Needs: Not on file  Physical Activity: Not on file  Stress: Not on file  Social Connections: Not on file   Lives in a ***. Smoking: *** Occupation: ***  Environmental HistorySurveyor, minerals in the house: Network engineer in the family room: {Blank single:19197::yes,no} Carpet in the bedroom: {Blank single:19197::yes,no} Heating: {Blank single:19197::electric,gas,heat pump} Cooling: {Blank single:19197::central,window,heat pump} Pet: {Blank single:19197::yes ***,no}  Family History: Family History  Problem Relation Age of Onset  . Hypertension Father   . Heart disease Father        stents placed  . Cancer Sister   . Ovarian cancer Sister        male cancer ? if ovaries  . Asthma Son   . Colon cancer Neg Hx   . Esophageal cancer Neg Hx   . Stomach cancer Neg Hx   . Rectal cancer Neg Hx    Problem                               Relation Asthma                                   *** Eczema                                *** Food allergy                          *** Allergic rhino conjunctivitis     ***  Review of Systems  Constitutional:  Negative for appetite change, chills, fever and unexpected weight change.  HENT:  Negative for congestion and rhinorrhea.   Eyes:  Negative for itching.  Respiratory:  Negative for cough, chest tightness,  shortness of breath and wheezing.   Cardiovascular:  Negative for chest pain.  Gastrointestinal:  Negative for abdominal pain.  Genitourinary:  Negative for difficulty urinating.  Skin:  Negative for rash.  Neurological:  Negative for headaches.    Objective: There were no vitals taken  for this visit. There is no height or weight on file to calculate BMI. Physical Exam Vitals and nursing note reviewed.  Constitutional:      Appearance: Normal appearance. He is well-developed.  HENT:     Head: Normocephalic and atraumatic.     Right Ear: Tympanic membrane and external ear normal.     Left Ear: Tympanic membrane and external ear normal.     Nose: Nose normal.     Mouth/Throat:     Mouth: Mucous membranes are moist.     Pharynx: Oropharynx is clear.  Eyes:     Conjunctiva/sclera: Conjunctivae normal.  Cardiovascular:     Rate and Rhythm: Normal rate and regular rhythm.     Heart sounds: Normal heart sounds. No murmur heard.    No friction rub. No gallop.  Pulmonary:     Effort: Pulmonary effort is normal.     Breath sounds: Normal breath sounds. No wheezing, rhonchi or rales.  Musculoskeletal:     Cervical back: Neck supple.  Skin:    General: Skin is warm.     Findings: No rash.  Neurological:     Mental Status: He is alert and oriented to person, place, and time.  Psychiatric:        Behavior: Behavior normal.   The plan was reviewed with the patient/family, and all questions/concerned were addressed.  It was my pleasure to see Broden today and participate in his care. Please feel free to contact me with any questions or concerns.  Sincerely,  Orlan Cramp, DO Allergy & Immunology  [facility]  [place] office: [phone] [facility] office: [phone]

## 2024-05-17 ENCOUNTER — Ambulatory Visit: Admitting: Allergy

## 2024-05-17 ENCOUNTER — Other Ambulatory Visit: Payer: Self-pay

## 2024-05-17 ENCOUNTER — Encounter: Payer: Self-pay | Admitting: Allergy

## 2024-05-17 VITALS — BP 112/88 | HR 89 | Temp 98.4°F | Ht 67.72 in | Wt 275.6 lb

## 2024-05-17 DIAGNOSIS — R21 Rash and other nonspecific skin eruption: Secondary | ICD-10-CM

## 2024-05-17 DIAGNOSIS — T7800XD Anaphylactic reaction due to unspecified food, subsequent encounter: Secondary | ICD-10-CM

## 2024-05-17 DIAGNOSIS — J3089 Other allergic rhinitis: Secondary | ICD-10-CM | POA: Diagnosis not present

## 2024-05-17 DIAGNOSIS — Z8709 Personal history of other diseases of the respiratory system: Secondary | ICD-10-CM | POA: Diagnosis not present

## 2024-05-17 DIAGNOSIS — T7800XA Anaphylactic reaction due to unspecified food, initial encounter: Secondary | ICD-10-CM

## 2024-05-17 MED ORDER — TRIAMCINOLONE ACETONIDE 0.1 % EX OINT
1.0000 | TOPICAL_OINTMENT | Freq: Two times a day (BID) | CUTANEOUS | 2 refills | Status: AC | PRN
Start: 2024-05-17 — End: ?

## 2024-05-17 NOTE — Patient Instructions (Addendum)
 Skin  Usually food allergies do NOT cause the type of rash you showed me today. No need for food allergy testing for this. I'm not sure what caused the rash.   Keep track of rashes and take pictures. Write down what you had done during flares.  See below for proper skin care. Use fragrance free and dye free products. No dryer sheets or fabric softener.   Use triamcinolone  0.1% ointment twice a day as needed for rash flares. Do not use on the face, neck, armpits or groin area. Do not use more than 3 weeks in a row.   Food  Continue strict avoidance of peanuts and tree nuts. For mild symptoms you can take over the counter antihistamines (zyrtec 10mg  to 20mg ) and monitor symptoms closely.  If symptoms worsen or if you have severe symptoms including breathing issues, throat closure, significant swelling, whole body hives, severe diarrhea and vomiting, lightheadedness then use epinephrine  and seek immediate medical care afterwards. Emergency action plan given.  Environmental allergies Continue Singulair  (montelukast ) 10mg  daily at night. Use over the counter antihistamines such as Zyrtec (cetirizine), Claritin (loratadine), Allegra (fexofenadine), or Xyzal (levocetirizine) daily as needed. May take twice a day during allergy flares. May switch antihistamines every few months. Monitor symptoms - consider retesting if symptoms worsen.  Asthma May use albuterol  rescue inhaler 2 puffs every 4 to 6 hours as needed for shortness of breath, chest tightness, coughing, and wheezing. Monitor frequency of use - if you need to use it more than twice per week on a consistent basis let us  know.   Get bloodwork We are ordering labs, so please allow 1-2 weeks for the results to come back. With the newly implemented Cures Act, the labs might be visible to you at the same time that they become visible to me. However, I will not address the results until all of the results are back, so please be patient.  In  the meantime, continue recommendations in your patient instructions, including avoidance measures (if applicable), until you hear from me.  Return if symptoms worsen or fail to improve. Or sooner if needed.    Skin care recommendations  Bath time: Always use lukewarm water. AVOID very hot or cold water. Keep bathing time to 5-10 minutes. Do NOT use bubble bath. Use a mild soap and use just enough to wash the dirty areas. Do NOT scrub skin vigorously.  After bathing, pat dry your skin with a towel. Do NOT rub or scrub the skin.  Moisturizers and prescriptions:  ALWAYS apply moisturizers immediately after bathing (within 3 minutes). This helps to lock-in moisture. Use the moisturizer several times a day over the whole body. Good summer moisturizers include: Aveeno, CeraVe, Cetaphil. Good winter moisturizers include: Aquaphor, Vaseline, Cerave, Cetaphil, Eucerin, Vanicream. When using moisturizers along with medications, the moisturizer should be applied about one hour after applying the medication to prevent diluting effect of the medication or moisturize around where you applied the medications. When not using medications, the moisturizer can be continued twice daily as maintenance.  Laundry and clothing: Avoid laundry products with added color or perfumes. Use unscented hypo-allergenic laundry products such as Tide free, Cheer free & gentle, and All free and clear.  If the skin still seems dry or sensitive, you can try double-rinsing the clothes. Avoid tight or scratchy clothing such as wool. Do not use fabric softeners or dyer sheets.

## 2024-05-19 LAB — IGE NUT PROF. W/COMPONENT RFLX

## 2024-05-23 ENCOUNTER — Ambulatory Visit: Payer: Self-pay | Admitting: Allergy

## 2024-05-23 LAB — COMPREHENSIVE METABOLIC PANEL WITH GFR
ALT: 14 IU/L (ref 0–44)
AST: 16 IU/L (ref 0–40)
Albumin: 4.6 g/dL (ref 3.8–4.9)
Alkaline Phosphatase: 66 IU/L (ref 44–121)
BUN/Creatinine Ratio: 11 (ref 9–20)
BUN: 11 mg/dL (ref 6–24)
Bilirubin Total: 0.7 mg/dL (ref 0.0–1.2)
CO2: 22 mmol/L (ref 20–29)
Calcium: 10 mg/dL (ref 8.7–10.2)
Chloride: 100 mmol/L (ref 96–106)
Creatinine, Ser: 1.01 mg/dL (ref 0.76–1.27)
Globulin, Total: 3 g/dL (ref 1.5–4.5)
Glucose: 79 mg/dL (ref 70–99)
Potassium: 4.6 mmol/L (ref 3.5–5.2)
Sodium: 141 mmol/L (ref 134–144)
Total Protein: 7.6 g/dL (ref 6.0–8.5)
eGFR: 90 mL/min/1.73 (ref >59)

## 2024-05-23 LAB — CBC WITH DIFFERENTIAL/PLATELET
Basophils Absolute: 0 x10E3/uL (ref 0.0–0.2)
Basos: 0 %
EOS (ABSOLUTE): 0.1 x10E3/uL (ref 0.0–0.4)
Eos: 2 %
Hematocrit: 47.4 % (ref 37.5–51.0)
Hemoglobin: 15.2 g/dL (ref 13.0–17.7)
Immature Grans (Abs): 0 x10E3/uL (ref 0.0–0.1)
Immature Granulocytes: 0 %
Lymphocytes Absolute: 1 x10E3/uL (ref 0.7–3.1)
Lymphs: 27 %
MCH: 28.4 pg (ref 26.6–33.0)
MCHC: 32.1 g/dL (ref 31.5–35.7)
MCV: 88 fL (ref 79–97)
Monocytes Absolute: 0.3 x10E3/uL (ref 0.1–0.9)
Monocytes: 8 %
Neutrophils Absolute: 2.3 x10E3/uL (ref 1.4–7.0)
Neutrophils: 63 %
Platelets: 262 x10E3/uL (ref 150–450)
RBC: 5.36 x10E6/uL (ref 4.14–5.80)
RDW: 14.3 % (ref 11.6–15.4)
WBC: 3.6 x10E3/uL (ref 3.4–10.8)

## 2024-05-23 LAB — IGE NUT PROF. W/COMPONENT RFLX
F017-IgE Hazelnut (Filbert): 0.15 kU/L — AB
F018-IgE Brazil Nut: 0.1 kU/L — AB
F020-IgE Almond: <0.1 kU/L
F202-IgE Cashew Nut: <0.1 kU/L
F203-IgE Pistachio Nut: 0.13 kU/L — AB
F256-IgE Walnut: 0.17 kU/L — AB
Macadamia Nut, IgE: <0.1 kU/L
Peanut, IgE: 14.5 kU/L — AB
Pecan Nut IgE: <0.1 kU/L

## 2024-05-23 LAB — PANEL 604726
Cor A 1 IgE: <0.1 kU/L
Cor A 14 IgE: <0.1 kU/L
Cor A 8 IgE: <0.1 kU/L
Cor A 9 IgE: <0.1 kU/L

## 2024-05-23 LAB — TRYPTASE: Tryptase: 6.8 ug/L (ref 2.2–13.2)

## 2024-05-23 LAB — PANEL 604721
Jug R 1 IgE: <0.1 kU/L
Jug R 3 IgE: <0.1 kU/L

## 2024-05-23 LAB — PEANUT COMPONENTS
F352-IgE Ara h 8: <0.1 kU/L
F422-IgE Ara h 1: 11.6 kU/L — AB
F423-IgE Ara h 2: 3.03 kU/L — AB
F424-IgE Ara h 3: <0.1 kU/L
F427-IgE Ara h 9: <0.1 kU/L
F447-IgE Ara h 6: 5.53 kU/L — AB

## 2024-05-23 LAB — C-REACTIVE PROTEIN: CRP: 6 mg/L (ref 0–10)

## 2024-05-23 LAB — ALLERGEN COMPONENT COMMENTS

## 2024-05-23 LAB — ALPHA-GAL PANEL
Allergen Lamb IgE: <0.1 kU/L
Beef IgE: <0.1 kU/L
IgE (Immunoglobulin E), Serum: 839 [IU]/mL — ABNORMAL HIGH (ref 6–495)
O215-IgE Alpha-Gal: <0.1 kU/L
Pork IgE: <0.1 kU/L

## 2024-05-23 LAB — ANTINUCLEAR ANTIBODIES, IFA: ANA Titer 1: POSITIVE — AB

## 2024-05-23 LAB — PANEL 604350: Ber E 1 IgE: 0.19 kU/L — AB

## 2024-05-23 LAB — FANA STAINING PATTERNS: Speckled Pattern: 1:80 {titer}

## 2024-05-23 LAB — SEDIMENTATION RATE: Sed Rate: 34 mm/h — ABNORMAL HIGH (ref 0–30)

## 2024-05-26 ENCOUNTER — Other Ambulatory Visit: Payer: Self-pay | Admitting: Nurse Practitioner

## 2024-07-10 ENCOUNTER — Emergency Department

## 2024-07-10 ENCOUNTER — Emergency Department
Admission: EM | Admit: 2024-07-10 | Discharge: 2024-07-10 | Disposition: A | Discharge status: Home / Self Care | Attending: Emergency Medicine | Admitting: Emergency Medicine

## 2024-07-10 ENCOUNTER — Other Ambulatory Visit: Payer: Self-pay

## 2024-07-10 DIAGNOSIS — M549 Dorsalgia, unspecified: Secondary | ICD-10-CM | POA: Diagnosis not present

## 2024-07-10 DIAGNOSIS — R0789 Other chest pain: Secondary | ICD-10-CM | POA: Diagnosis present

## 2024-07-10 DIAGNOSIS — R079 Chest pain, unspecified: Secondary | ICD-10-CM

## 2024-07-10 DIAGNOSIS — I10 Essential (primary) hypertension: Secondary | ICD-10-CM | POA: Insufficient documentation

## 2024-07-10 LAB — HEPATIC FUNCTION PANEL
ALT: 20 U/L (ref 0–44)
AST: 23 U/L (ref 15–41)
Albumin: 3.9 g/dL (ref 3.5–5.0)
Alkaline Phosphatase: 45 U/L (ref 38–126)
Bilirubin, Direct: 0.1 mg/dL (ref 0.0–0.2)
Indirect Bilirubin: 0.7 mg/dL (ref 0.3–0.9)
Total Bilirubin: 0.8 mg/dL (ref 0.0–1.2)
Total Protein: 7.7 g/dL (ref 6.5–8.1)

## 2024-07-10 LAB — CBC
HCT: 41.7 % (ref 39.0–52.0)
Hemoglobin: 14.5 g/dL (ref 13.0–17.0)
MCH: 28.9 pg (ref 26.0–34.0)
MCHC: 34.8 g/dL (ref 30.0–36.0)
MCV: 83.1 fL (ref 80.0–100.0)
Platelets: 252 K/uL (ref 150–400)
RBC: 5.02 MIL/uL (ref 4.22–5.81)
RDW: 13.6 % (ref 11.5–15.5)
WBC: 3.8 K/uL — ABNORMAL LOW (ref 4.0–10.5)
nRBC: 0 % (ref 0.0–0.2)

## 2024-07-10 LAB — BASIC METABOLIC PANEL WITH GFR
Anion gap: 11 (ref 5–15)
BUN: 10 mg/dL (ref 6–20)
CO2: 24 mmol/L (ref 22–32)
Calcium: 9.4 mg/dL (ref 8.9–10.3)
Chloride: 103 mmol/L (ref 98–111)
Creatinine, Ser: 1.02 mg/dL (ref 0.61–1.24)
GFR, Estimated: >60 mL/min (ref >60)
Glucose, Bld: 87 mg/dL (ref 70–99)
Potassium: 3.6 mmol/L (ref 3.5–5.1)
Sodium: 138 mmol/L (ref 135–145)

## 2024-07-10 LAB — LIPASE, BLOOD: Lipase: 35 U/L (ref 11–51)

## 2024-07-10 LAB — TROPONIN I (HIGH SENSITIVITY)
Troponin I (High Sensitivity): 3 ng/L (ref <18)
Troponin I (High Sensitivity): 4 ng/L (ref <18)

## 2024-07-10 MED ORDER — IOHEXOL 350 MG/ML SOLN
100.0000 mL | Freq: Once | INTRAVENOUS | Status: AC | PRN
Start: 2024-07-10 — End: 2024-07-10
  Administered 2024-07-10: 100 mL via INTRAVENOUS

## 2024-07-10 NOTE — Discharge Instructions (Signed)
 You were seen in the Emergency Department today for evaluation of your chest pain. Fortunately, your labs, EKG, and CT were overall reassuring against a emergency cause for your pain. I have placed a referral to cardiology for further evaluation of your chest pain.  Return to the ER for any new or worsening symptoms including worsening chest pain, difficulty breathing, or any other new or concerning symptoms that you believe warrants immediate attention.

## 2024-07-10 NOTE — ED Triage Notes (Signed)
 BIBEMS, [place]. C/o CP that started this am. Described as pressure. Intermittent back pain x1 week. GCS 15, 172/111, 188/117 L/R. Complaint with hydrochlorothiazide . Ambualtory with EMS. 12 lead unremarkable.

## 2024-07-10 NOTE — ED Provider Notes (Signed)
 [facility] Provider Note    Event Date/Time   First MD Initiated Contact with Patient 07/10/24 [phone]     (approximate)   History   Chest Pain   HPI  Troy Olson is a 53 year old male presenting to the emergency department for evaluation of chest pain.  Patient reports that over the past week he has had intermittent back pain.  When he was driving earlier today he had severe chest pain described as a pressure in the center of his chest radiating to his back.  History of hypertension, reports compliance with his medication.     Physical Exam   Triage Vital Signs: ED Triage Vitals  Encounter Vitals Group     BP 07/10/24 0955 (!) 146/115     Girls Systolic BP Percentile --      Girls Diastolic BP Percentile --      Boys Systolic BP Percentile --      Boys Diastolic BP Percentile --      Pulse Rate 07/10/24 0955 95     Resp 07/10/24 0955 19     Temp 07/10/24 0955 98 F (36.7 C)     Temp Source 07/10/24 0955 Oral     SpO2 07/10/24 0955 99 %     Weight 07/10/24 0951 264 lb (119.7 kg)     Height 07/10/24 0951 5' 8 (1.727 m)     Head Circumference --      Peak Flow --      Pain Score 07/10/24 0950 0     Pain Loc --      Pain Education --      Exclude from Growth Chart --     Most recent vital signs: Vitals:   07/10/24 1230 07/10/24 1300  BP: 137/89 129/85  Pulse: 75 77  Resp: 17 11  Temp:    SpO2: 99% 95%     General: Awake, interactive  CV:  Regular rate, good peripheral perfusion.  Resp:  Unlabored respirations, lungs clear to auscultation Abd:  Nondistended, soft, nontender Neuro:  Symmetric facial movement, fluid speech, moving extremity spontaneously and equally   ED Results / Procedures / Treatments   Labs (all labs ordered are listed, but only abnormal results are displayed) Labs Reviewed  CBC - Abnormal; Notable for the following components:      Result Value   WBC 3.8 (*)    All other components within normal limits   BASIC METABOLIC PANEL WITH GFR  HEPATIC FUNCTION PANEL  LIPASE, BLOOD  TROPONIN I (HIGH SENSITIVITY)  TROPONIN I (HIGH SENSITIVITY)     EKG EKG independently reviewed and interpreted by myself demonstrates:  EKG demonstrates normal sinus rhythm at a rate of 80, PR 194, QRS 136, QTc 433, bifascicular block noted, not new  RADIOLOGY Imaging independently reviewed and interpreted by myself demonstrates:  CTA dissection protocol without visible dissection or other acute abnormality Chest x-Kelsee Preslar without focal consolidation  Formal Radiology Read:  CT Angio Chest/Abd/Pel for Dissection W and/or Wo Contrast Result Date: 07/10/2024 CLINICAL DATA:  Acute aortic syndrome (AAS) suspected. Chest pain. Intermittent back pain for 1 week. EXAM: CT ANGIOGRAPHY CHEST, ABDOMEN AND PELVIS TECHNIQUE: Non-contrast CT of the chest was initially obtained. Multidetector CT imaging through the chest, abdomen and pelvis was performed using the standard protocol during bolus administration of intravenous contrast. Multiplanar reconstructed images and MIPs were obtained and reviewed to evaluate the vascular anatomy. RADIATION DOSE REDUCTION: This exam was performed according to the departmental dose-optimization  program which includes automated exposure control, adjustment of the mA and/or kV according to patient size and/or use of iterative reconstruction technique. CONTRAST:  OMNIPAQUE IOHEXOL 350 MG/ML SOLN COMPARISON:  CT angiography chest from 06/16/2016. FINDINGS: CTA CHEST FINDINGS Cardiovascular: No intramural hematoma noted in the thoracic aorta on the unenhanced images. Thoracic aorta is normal in caliber without aneurysm, dissection, vasculitis or significant stenosis. Normal cardiac size. No pericardial effusion. There is satisfactory opacification of bilateral pulmonary arteries. No embolism seen up to the proximal subsegmental pulmonary artery level. Mediastinum/Nodes: Visualized thyroid gland appears  grossly unremarkable. No solid / cystic mediastinal masses. The esophagus is nondistended precluding optimal assessment. No axillary, mediastinal or hilar lymphadenopathy by size criteria. Lungs/Pleura: The central tracheo-bronchial tree is patent. No mass or consolidation. No pleural effusion or pneumothorax. No suspicious lung nodules. Musculoskeletal: The visualized soft tissues of the chest wall are grossly unremarkable. No suspicious osseous lesions. There are mild multilevel degenerative changes in the visualized spine. Review of the MIP images confirms the above findings. CTA ABDOMEN AND PELVIS FINDINGS VASCULAR Aorta: Normal caliber aorta without aneurysm, dissection, vasculitis or significant stenosis. Celiac: Patent without evidence of aneurysm, dissection, vasculitis or significant stenosis. SMA: Patent without evidence of aneurysm, dissection, vasculitis or significant stenosis. Renals: Both renal arteries are patent without evidence of aneurysm, dissection, vasculitis, fibromuscular dysplasia or significant stenosis. IMA: Patent without evidence of aneurysm, dissection, vasculitis or significant stenosis. Inflow: Patent without evidence of aneurysm, dissection, vasculitis or significant stenosis. Veins: No obvious venous abnormality within the limitations of this arterial phase study. Review of the MIP images confirms the above findings. NON-VASCULAR Hepatobiliary: The liver is normal in size. Non-cirrhotic configuration. No suspicious mass. No intrahepatic or extrahepatic bile duct dilation. No calcified gallstones. Normal gallbladder wall thickness. No pericholecystic inflammatory changes. Pancreas: Unremarkable. No pancreatic ductal dilatation or surrounding inflammatory changes. Spleen: Within normal limits. No focal lesion. Adrenals/Urinary Tract: Adrenal glands are unremarkable. No suspicious renal mass. No hydronephrosis. No renal or ureteric calculi. Unremarkable urinary bladder. Stomach/Bowel:  No disproportionate dilation of the small or large bowel loops. No evidence of abnormal bowel wall thickening or inflammatory changes. The appendix is unremarkable. Vascular/Lymphatic: No ascites or pneumoperitoneum. No abdominal or pelvic lymphadenopathy, by size criteria. No aneurysmal dilation of the major abdominal arteries. Reproductive: Normal size prostate. Symmetric seminal vesicles. Other: There is a small fat containing periumbilical hernia. The soft tissues and abdominal wall are otherwise unremarkable. Musculoskeletal: No suspicious osseous lesions. There are mild - moderate multilevel degenerative changes in the visualized spine. Review of the MIP images confirms the above findings. IMPRESSION: 1. No acute aortic syndrome. 2. No acute inflammatory process identified within the chest, abdomen or pelvis. 3. Multiple other nonacute observations, as described above. Electronically Signed   By: Ree Molt M.D.   On: 07/10/2024 12:21   DG Chest 2 View Result Date: 07/10/2024 CLINICAL DATA:  Acute chest pain beginning this morning. EXAM: CHEST - 2 VIEW COMPARISON:  07/03/2020 FINDINGS: The heart size and mediastinal contours are within normal limits. Both lungs are clear. The visualized skeletal structures are unremarkable. IMPRESSION: No active cardiopulmonary disease. Electronically Signed   By: Norleen DELENA Kil M.D.   On: 07/10/2024 10:38    PROCEDURES:  Critical Care performed: No  Procedures   MEDICATIONS ORDERED IN ED: Medications  iohexol (OMNIPAQUE) 350 MG/ML injection 100 mL (100 mLs Intravenous Contrast Given 07/10/24 1139)     IMPRESSION / MDM / ASSESSMENT AND PLAN / ED COURSE  I reviewed the  triage vital signs and the nursing notes.  Differential diagnosis includes, but is not limited to, ACS, pneumonia, pneumothorax, aortic dissection, pancreatitis, lower suspicion gastritis given left lateral location of pain, sideration for musculoskeletal pain, but pain cannot be reproduced  with palpation or positional changes on exam  Patient's presentation is most consistent with acute presentation with potential threat to life or bodily function.  53 year old male presenting to the emergency department for evaluation of chest pain.  Pain largely resolved at time of presentation.  Overall reassuring vitals.  Reassuring labs including CBC, CMP, LFTs, lipase, negative troponin x 2.  EKG with bifascicular block without appreciable superimposed ischemic changes.  With chest pain radiating to the back with sudden onset, dissection protocol CT was ordered which fortunately was without acute findings.  Patient reassessed and denies any worsening symptoms.  Received aspirin with EMS.  With improved symptoms, do think discharge with close outpatient follow-up is reasonable.  Patient is comfortable this plan.  Will place referral to cardiology.  Patient discharged in stable condition.      FINAL CLINICAL IMPRESSION(S) / ED DIAGNOSES   Final diagnoses:  Nonspecific chest pain     Rx / DC Orders   ED Discharge Orders          Ordered    Ambulatory referral to Cardiology       Comments: If you have not heard from the Cardiology office within the next 72 hours please call [phone].   07/10/24 1358             Note:  This document was prepared using Dragon voice recognition software and may include unintentional dictation errors.   Levander Slate, MD 07/10/24 [phone]

## 2024-07-10 NOTE — Progress Notes (Signed)
   07/10/24 1030  Spiritual Encounters  Type of Visit Initial  Care provided to: Pt and family  Reason for visit Routine spiritual support  OnCall Visit Yes   Chaplain saw patient and family in [facility] and introduced herself and asked if they had any needs.  Patient said he'd like blankets and Chaplain said she'd get warm ones.  Chaplain offered family member nourishment or water and she declined.  Family member shared patient had a peanut  allergy but Chaplain didn't offer the patient any food or water.  Chaplain said if there are any spiritual care needs they can share with the staff and have the Chaplain paged.    Rev. Rana M. Nicholaus, M.Div. Chaplain Resident [facility]

## 2024-07-13 ENCOUNTER — Encounter: Payer: Self-pay | Admitting: Cardiology

## 2024-07-17 ENCOUNTER — Encounter: Payer: Self-pay | Admitting: Nurse Practitioner

## 2024-07-17 ENCOUNTER — Ambulatory Visit: Payer: Self-pay | Admitting: Nurse Practitioner

## 2024-07-17 VITALS — BP 120/70 | HR 82 | Temp 98.0°F | Ht 68.0 in | Wt 270.0 lb

## 2024-07-17 DIAGNOSIS — Z23 Encounter for immunization: Secondary | ICD-10-CM

## 2024-07-17 DIAGNOSIS — Z2821 Immunization not carried out because of patient refusal: Secondary | ICD-10-CM

## 2024-07-17 DIAGNOSIS — I10 Essential (primary) hypertension: Secondary | ICD-10-CM

## 2024-07-17 DIAGNOSIS — M545 Low back pain, unspecified: Secondary | ICD-10-CM

## 2024-07-17 DIAGNOSIS — Z09 Encounter for follow-up examination after completed treatment for conditions other than malignant neoplasm: Secondary | ICD-10-CM

## 2024-07-17 DIAGNOSIS — G4733 Obstructive sleep apnea (adult) (pediatric): Secondary | ICD-10-CM

## 2024-07-17 DIAGNOSIS — R7303 Prediabetes: Secondary | ICD-10-CM

## 2024-07-17 DIAGNOSIS — Z139 Encounter for screening, unspecified: Secondary | ICD-10-CM

## 2024-07-17 DIAGNOSIS — G8929 Other chronic pain: Secondary | ICD-10-CM

## 2024-07-17 DIAGNOSIS — R079 Chest pain, unspecified: Secondary | ICD-10-CM

## 2024-07-17 DIAGNOSIS — R5383 Other fatigue: Secondary | ICD-10-CM

## 2024-07-17 DIAGNOSIS — Z6841 Body Mass Index (BMI) 40.0 and over, adult: Secondary | ICD-10-CM

## 2024-07-17 NOTE — Progress Notes (Signed)
 LILLETTE Kristeen JINNY Gladis, CMA,acting as a Neurosurgeon for Gaines Ada, FNP.,have documented all relevant documentation on the behalf of Gaines Ada, FNP,as directed by  Gaines Ada, FNP while in the presence of Gaines Ada, FNP.  Subjective:  Patient ID: Troy Olson , male    DOB: [birth year] , 53 y.o.   MRN: [id number]  Chief Complaint  Patient presents with   Hospitalization Follow-up    Patient presents today for a ER follow up, Patient reports compliance with medication. Patient denies any chest pain, SOB, or headaches. Patient went to ER on 07/10/2024 for Nonspecific chest pain. Patient reports the pain is better but he has a soreness in his chest, he reports is unsure if it was anxiety. He is scheduled to see the cardiologist the 17th.   Back Pain    Patient states he is still having a lot of pain in his back from having arthritis in his back. He reports he goes to ortho.      HPI  Discussed the use of AI scribe software for clinical note transcription with the patient, who gave verbal consent to proceed.  History of Present Illness Troy Olson is a 53 year old male with hypertension and prediabetes who presents with chest pain and back pain.  He experiences severe chest pain described as 'squeezing' in the center of his chest radiating to his back, which occurred last Monday and lasted for about an hour. He did not take any aspirin or additional medications at that time, but was transported to the hospital by ambulance. He continues to experience intermittent nagging chest discomfort and back pain, which he describes as non-muscular in nature. No muscle tension associated with chest pain.  He has a history of hypertension. He is managing prediabetes with metformin . He acknowledges recent dietary lapses, which he believes contributed to a slight weight gain, although he has been losing weight steadily prior to this.  He uses a CPAP machine nightly for sleep apnea, although he has not had a sleep  study in over five years. He mentions that his CPAP machine is set to the maximum setting. 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  He reports ongoing back pain for which he has been receiving treatment at an orthopedic clinic. Previous treatments, including injections, have not been effective.  He experiences fatigue, noting that he feels tired around midday and sometimes takes naps, which is a new development for him. He has also experienced a decrease in interest in exercise.   Past Medical History:  Diagnosis Date   Abscess of axilla, left 10/12/2017   Allergies    Allergy    Arthritis    Asthma    Back pain    Eczema    Edema of both lower extremities    Food allergy    HLD (hyperlipidemia)    Hypertension    Morbid obesity (HCC)    Prediabetes    Sleep apnea    CPAP     Family History  Problem Relation Age of Onset   Hypertension Father    Heart disease Father        stents placed   Cancer Sister    Ovarian cancer Sister        male cancer ? if ovaries   Asthma Son    Colon cancer Neg Hx    Esophageal cancer Neg Hx    Stomach cancer Neg Hx    Rectal cancer Neg Hx  Current Outpatient Medications:    albuterol  (VENTOLIN  HFA) 108 (90 Base) MCG/ACT inhaler, Inhale 2 puffs into the lungs every 6 (six) hours as needed. wheezing, Disp: 1 each, Rfl: 5   amLODipine  (NORVASC ) 2.5 MG tablet, Take 1 tablet (2.5 mg total) by mouth daily., Disp: 90 tablet, Rfl: 1   Blood Pressure Monitoring (BLOOD PRESSURE MONITOR/L CUFF) MISC, Use to check blood pressure, Disp: 1 each, Rfl: 0   cetirizine (ZYRTEC) 10 MG tablet, Take 10 mg by mouth daily., Disp: , Rfl:    clobetasol  cream (TEMOVATE ) 0.05 %, Apply 1 Application topically 2 (two) times daily., Disp: 60 g, Rfl: 3   EPINEPHrine  (EPIPEN  2-PAK) 0.3 mg/0.3 mL IJ SOAJ injection, Inject 0.3 mg into the muscle as needed for anaphylaxis., Disp: 1 each, Rfl: 5   hydrochlorothiazide  (MICROZIDE ) 12.5  MG capsule, TAKE 1 CAPSULE BY MOUTH TWICE  DAILY, Disp: 180 capsule, Rfl: 3   ipratropium-albuterol  (DUONEB) 0.5-2.5 (3) MG/3ML SOLN, Take 3 mLs by nebulization every 4 (four) hours as needed for up to 14 days., Disp: 180 mL, Rfl: 0   Magnesium  250 MG TABS, Take 1 tablet (250 mg total) by mouth daily. With evening meal, Disp: 30 tablet, Rfl: 0   metFORMIN  (GLUCOPHAGE ) 1000 MG tablet, TAKE 1 TABLET BY MOUTH TWICE  DAILY WITH MEALS, Disp: 180 tablet, Rfl: 3   montelukast  (SINGULAIR ) 10 MG tablet, TAKE 1 TABLET BY MOUTH DAILY, Disp: 90 tablet, Rfl: 3   naproxen  (NAPROSYN ) 500 MG tablet, Take 1 tablet (500 mg total) by mouth 2 (two) times daily with a meal., Disp: 30 tablet, Rfl: 0   pravastatin  (PRAVACHOL ) 40 MG tablet, TAKE 1 TABLET BY MOUTH IN THE  EVENING, Disp: 90 tablet, Rfl: 3   scopolamine  (TRANSDERM-SCOP) 1 MG/3DAYS, Place 1 patch (1.5 mg total) onto the skin every 3 (three) days., Disp: 4 patch, Rfl: 0   triamcinolone  ointment (KENALOG ) 0.1 %, Apply 1 Application topically 2 (two) times daily as needed (rash flare). Do not use on the face, neck, armpits or groin area. Do not use more than 3 weeks in a row., Disp: 30 g, Rfl: 2   ZEPBOUND 7.5 MG/0.5ML Pen, SMARTSIG:7.5 Milligram(s) SUB-Q Once a Week, Disp: , Rfl:    traMADol (ULTRAM) 50 MG tablet, Take 1 tablet (50 mg total) by mouth every 8 (eight) hours as needed., Disp: 20 tablet, Rfl: 0   Allergies  Allergen Reactions   Food Allergy Formula Shortness Of Breath and Swelling    Any nuts of any kind.   Peanut -Containing Drug Products Shortness Of Breath, Diarrhea, Nausea And Vomiting and Swelling     Review of Systems  Constitutional: Negative.   HENT: Negative.    Eyes: Negative.   Respiratory: Negative.    Cardiovascular: Negative.   Gastrointestinal: Negative.   Psychiatric/Behavioral: Negative.       Today's Vitals   07/17/24 0850  BP: 120/70  Pulse: 82  Temp: 98 F (36.7 C)  TempSrc: Oral  Weight: 270 lb (122.5 kg)   Height: 5' 8 (1.727 m)  PainSc: 3   PainLoc: Sternum   Body mass index is 41.05 kg/m.  Wt Readings from Last 3 Encounters:  07/17/24 270 lb (122.5 kg)  07/10/24 264 lb (119.7 kg)  05/17/24 275 lb 9.6 oz (125 kg)     Objective:  Physical Exam Vitals and nursing note reviewed.  Constitutional:      General: He is not in acute distress.    Appearance: Normal appearance. He is obese.  Cardiovascular:     Rate and Rhythm: Normal rate and regular rhythm.     Pulses: Normal pulses.     Heart sounds: Normal heart sounds. No murmur heard. Pulmonary:     Effort: Pulmonary effort is normal. No respiratory distress.     Breath sounds: No wheezing.  Skin:    General: Skin is warm.     Capillary Refill: Capillary refill takes less than 2 seconds.  Neurological:     General: No focal deficit present.     Mental Status: He is alert and oriented to person, place, and time.     Cranial Nerves: No cranial nerve deficit.     Motor: No weakness.  Psychiatric:        Mood and Affect: Mood normal.        Behavior: Behavior normal.        Thought Content: Thought content normal.        Judgment: Judgment normal.         Assessment And Plan:  Nonspecific chest pain Assessment & Plan: Intermittent chest pain with severe episodes radiating to the back. Recent ER visit with negative tests. Current pain is nagging and intermittent, not related to muscle tension. - Follow up with cardiology on October 17th. - Advise to go to the ER if severe chest pain occurs again.   Herpes zoster vaccination declined  Need for influenza vaccination Assessment & Plan: Influenza vaccine administered Encouraged to take Tylenol as needed for fever or muscle aches.    Essential hypertension Assessment & Plan: Blood pressure is better controlled, had to repeat and was improved. Continue current medications   Prediabetes Assessment & Plan: Prediabetes managed with metformin . A1c was normal at last  check. Discussed the importance of dietary control to maintain glucose levels. - Continue metformin  until glucose levels are controlled for 6 months to a year. - Check A1c at next visit. - Encourage dietary modifications to manage glucose levels.   Morbid obesity with BMI of 40.0-44.9, adult [facility]) Assessment & Plan: Weight has fluctuated, with recent weight gain attributed to dietary choices. Previously lost 11 pounds over six weeks. - Encourage continued weight management efforts. - Discuss dietary habits and encourage healthy eating.   OSA on CPAP Assessment & Plan: Uses CPAP machine set to maximum settings. 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 Discussed the possibility of a new sleep study to ensure current settings are appropriate. - Order sleep study through [facility]. - Advise to continue using CPAP machine nightly.  Orders: -     Ambulatory referral to Sleep Studies  Other fatigue Assessment & Plan: Discussed potential testosterone level check due to fatigue and decreased interest in exercise. Natural decline in testosterone with age noted. Caution advised with testosterone therapy due to cardiac concerns. - Order testosterone level check. - Check A1c during lab visit.  Orders: -     Testosterone  Encounter for screening  Chronic midline low back pain without sciatica Assessment & Plan: Chronic back pain not relieved by current treatment. Considering nerve ablation as previous injections were ineffective. - Follow up with orthopedic specialist for potential nerve ablation.    Return for 6 month bp check.  Patient was given opportunity to ask questions. Patient verbalized understanding of the plan and was able to repeat key elements of the plan. All questions were answered to their satisfaction.    LILLETTE Gaines Ada, FNP, have reviewed all documentation for this visit. The documentation on  07/17/24 for the exam, diagnosis, procedures, and  orders are all accurate and complete.   IF YOU HAVE BEEN REFERRED TO A SPECIALIST, IT MAY TAKE 1-2 WEEKS TO SCHEDULE/PROCESS THE REFERRAL. IF YOU HAVE NOT HEARD FROM US /SPECIALIST IN TWO WEEKS, PLEASE GIVE US  A CALL AT [phone].

## 2024-07-18 ENCOUNTER — Other Ambulatory Visit: Payer: Self-pay | Admitting: Nurse Practitioner

## 2024-07-18 ENCOUNTER — Ambulatory Visit: Payer: Self-pay | Admitting: Nurse Practitioner

## 2024-07-18 ENCOUNTER — Telehealth: Payer: Self-pay

## 2024-07-18 DIAGNOSIS — R7989 Other specified abnormal findings of blood chemistry: Secondary | ICD-10-CM

## 2024-07-18 LAB — TESTOSTERONE: Testosterone: 259 ng/dL — ABNORMAL LOW (ref 264–916)

## 2024-07-18 NOTE — Telephone Encounter (Signed)
  LVM to verfiy card hx.

## 2024-07-19 ENCOUNTER — Other Ambulatory Visit: Payer: Self-pay

## 2024-07-19 DIAGNOSIS — R7989 Other specified abnormal findings of blood chemistry: Secondary | ICD-10-CM

## 2024-07-20 LAB — TESTOSTERONE,FREE AND TOTAL
Testosterone, Free: 12.1 pg/mL (ref 7.2–24.0)
Testosterone: 258 ng/dL — ABNORMAL LOW (ref 264–916)

## 2024-07-25 ENCOUNTER — Other Ambulatory Visit: Payer: Self-pay | Admitting: Nurse Practitioner

## 2024-07-25 ENCOUNTER — Other Ambulatory Visit: Payer: Self-pay | Admitting: Physical Medicine and Rehabilitation

## 2024-07-25 DIAGNOSIS — G8929 Other chronic pain: Secondary | ICD-10-CM

## 2024-07-25 DIAGNOSIS — M47816 Spondylosis without myelopathy or radiculopathy, lumbar region: Secondary | ICD-10-CM

## 2024-07-25 MED ORDER — TRAMADOL HCL 50 MG PO TABS: 50.0000 mg | ORAL_TABLET | Freq: Three times a day (TID) | ORAL | 0 refills | Status: AC | PRN

## 2024-07-26 ENCOUNTER — Ambulatory Visit: Payer: Self-pay | Admitting: Nurse Practitioner

## 2024-07-26 DIAGNOSIS — R079 Chest pain, unspecified: Secondary | ICD-10-CM | POA: Insufficient documentation

## 2024-07-26 DIAGNOSIS — R5383 Other fatigue: Secondary | ICD-10-CM | POA: Insufficient documentation

## 2024-07-26 DIAGNOSIS — R7989 Other specified abnormal findings of blood chemistry: Secondary | ICD-10-CM

## 2024-07-26 MED ORDER — TESTOSTERONE 4 MG/24HR TD PT24: 1.0000 | MEDICATED_PATCH | Freq: Every day | TRANSDERMAL | 3 refills | Status: AC

## 2024-07-26 NOTE — Assessment & Plan Note (Signed)
 Discussed potential testosterone level check due to fatigue and decreased interest in exercise. Natural decline in testosterone with age noted. Caution advised with testosterone therapy due to cardiac concerns. - Order testosterone level check. - Check A1c during lab visit.

## 2024-07-26 NOTE — Assessment & Plan Note (Signed)
 Weight has fluctuated, with recent weight gain attributed to dietary choices. Previously lost 11 pounds over six weeks. - Encourage continued weight management efforts. - Discuss dietary habits and encourage healthy eating.

## 2024-07-26 NOTE — Assessment & Plan Note (Signed)
 Blood pressure is better controlled, had to repeat and was improved. Continue current medications

## 2024-07-26 NOTE — Assessment & Plan Note (Signed)
 Influenza vaccine administered Encouraged to take Tylenol as needed for fever or muscle aches.

## 2024-07-26 NOTE — Assessment & Plan Note (Signed)
 Intermittent chest pain with severe episodes radiating to the back. Recent ER visit with negative tests. Current pain is nagging and intermittent, not related to muscle tension. - Follow up with cardiology on October 17th. - Advise to go to the ER if severe chest pain occurs again.

## 2024-07-26 NOTE — Assessment & Plan Note (Addendum)
 Uses CPAP machine set to maximum settings. 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 Discussed the possibility of a new sleep study to ensure current settings are appropriate. - Order sleep study through [facility]. - Advise to continue using CPAP machine nightly.

## 2024-07-26 NOTE — Assessment & Plan Note (Signed)
 Chronic back pain not relieved by current treatment. Considering nerve ablation as previous injections were ineffective. - Follow up with orthopedic specialist for potential nerve ablation.

## 2024-07-26 NOTE — Assessment & Plan Note (Addendum)
 Prediabetes managed with metformin . A1c was normal at last check. Discussed the importance of dietary control to maintain glucose levels. - Continue metformin  until glucose levels are controlled for 6 months to a year. - Check A1c at next visit. - Encourage dietary modifications to manage glucose levels.

## 2024-07-28 ENCOUNTER — Encounter: Payer: Self-pay | Admitting: Cardiology

## 2024-07-28 ENCOUNTER — Ambulatory Visit: Attending: Cardiology | Admitting: Cardiology

## 2024-07-28 VITALS — BP 119/80 | HR 73 | Ht 71.0 in | Wt 267.8 lb

## 2024-07-28 DIAGNOSIS — I10 Essential (primary) hypertension: Secondary | ICD-10-CM

## 2024-07-28 DIAGNOSIS — E7800 Pure hypercholesterolemia, unspecified: Secondary | ICD-10-CM

## 2024-07-28 DIAGNOSIS — R072 Precordial pain: Secondary | ICD-10-CM

## 2024-07-28 MED ORDER — METOPROLOL TARTRATE 100 MG PO TABS: ORAL_TABLET | ORAL | 0 refills | Status: DC

## 2024-07-28 NOTE — Progress Notes (Signed)
 Cardiology Office Note:    Date:  07/28/2024   ID:  Troy Olson, DOB 15-Jan-1971, MRN [id number]  PCP:  Georgina Speaks, FNP   [facility] Providers Cardiologist:  None     Referring MD: Levander Slate, MD   Chief Complaint  Patient presents with   Establish Care    New pt has been doing well with no complaints of chest pain, chest pressure or SOB, medciation reviewed verbally with patient, pt states he has some back and shoulder pain    History of Present Illness:    Troy Olson is a 53 y.o. male with a hx of hypertension, hyperlipidemia, former smoker x 20 years presenting with chest pain.  Complains of episodes of chest pain about 3 weeks ago while sitting in his truck.  Described pain as nagging, mid chest area prompting him to go to the ED.  Workup in the ED EKGs and troponin was unrevealing.  Still has occasional pain in the left axillary area and back.  His father had coronary stent placement around age 58.  Past Medical History:  Diagnosis Date   Abscess of axilla, left 10/12/2017   Allergies    Allergy    Arthritis    Asthma    Back pain    Eczema    Edema of both lower extremities    Food allergy    HLD (hyperlipidemia)    Hypertension    Morbid obesity (HCC)    Prediabetes    Sleep apnea    CPAP    Past Surgical History:  Procedure Laterality Date   COLONOSCOPY  01/27/2021   Normal   MOUTH SURGERY     widsom teeth    Current Medications: Current Meds  Medication Sig   albuterol  (VENTOLIN  HFA) 108 (90 Base) MCG/ACT inhaler Inhale 2 puffs into the lungs every 6 (six) hours as needed. wheezing   amLODipine  (NORVASC ) 2.5 MG tablet Take 1 tablet (2.5 mg total) by mouth daily.   Blood Pressure Monitoring (BLOOD PRESSURE MONITOR/L CUFF) MISC Use to check blood pressure   cetirizine (ZYRTEC) 10 MG tablet Take 10 mg by mouth daily.   clobetasol  cream (TEMOVATE ) 0.05 % Apply 1 Application topically 2 (two) times daily.   EPINEPHrine  (EPIPEN  2-PAK)  0.3 mg/0.3 mL IJ SOAJ injection Inject 0.3 mg into the muscle as needed for anaphylaxis.   hydrochlorothiazide  (MICROZIDE ) 12.5 MG capsule TAKE 1 CAPSULE BY MOUTH TWICE  DAILY   ipratropium-albuterol  (DUONEB) 0.5-2.5 (3) MG/3ML SOLN Take 3 mLs by nebulization every 4 (four) hours as needed for up to 14 days.   Magnesium  250 MG TABS Take 1 tablet (250 mg total) by mouth daily. With evening meal   metFORMIN  (GLUCOPHAGE ) 1000 MG tablet TAKE 1 TABLET BY MOUTH TWICE  DAILY WITH MEALS   metoprolol tartrate (LOPRESSOR) 100 MG tablet TAKE 1 TABLET 2 HR PRIOR TO CARDIAC PROCEDURE   montelukast  (SINGULAIR ) 10 MG tablet TAKE 1 TABLET BY MOUTH DAILY   naproxen  (NAPROSYN ) 500 MG tablet Take 1 tablet (500 mg total) by mouth 2 (two) times daily with a meal.   pravastatin  (PRAVACHOL ) 40 MG tablet TAKE 1 TABLET BY MOUTH IN THE  EVENING   scopolamine  (TRANSDERM-SCOP) 1 MG/3DAYS Place 1 patch (1.5 mg total) onto the skin every 3 (three) days.   testosterone (ANDRODERM) 4 MG/24HR PT24 patch Place 1 patch onto the skin daily.   traMADol (ULTRAM) 50 MG tablet Take 1 tablet (50 mg total) by mouth every 8 (eight)  hours as needed.   triamcinolone  ointment (KENALOG ) 0.1 % Apply 1 Application topically 2 (two) times daily as needed (rash flare). Do not use on the face, neck, armpits or groin area. Do not use more than 3 weeks in a row.   ZEPBOUND 7.5 MG/0.5ML Pen SMARTSIG:7.5 Milligram(s) SUB-Q Once a Week     Allergies:   Food allergy formula and Peanut -containing drug products   Social History   Socioeconomic History   Marital status: Married    Spouse name: Rosaline    Number of children: 3   Years of education: Not on file   Highest education level: Not on file  Occupational History   Occupation: Truck driver  Tobacco Use   Smoking status: Former    Current packs/day: 0.00    Average packs/day: 0.5 packs/day for 20.0 years (10.0 ttl pk-yrs)    Types: Cigars, Cigarettes    Start date: 03/13/1991    Quit  date: 03/13/2011    Years since quitting: 13.3   Smokeless tobacco: Never   Tobacco comments:    Smokes cigars occasionally.  Vaping Use   Vaping status: Never Used  Substance and Sexual Activity   Alcohol use: Yes    Comment: occasionally--dark liquer 4-5 drinks consumed weekly   Drug use: No   Sexual activity: Yes    Birth control/protection: None  Other Topics Concern   Not on file  Social History Narrative   Patient is married with 2 sons and 1 daughter   He owns a dump truck   Former smoker   3 alcoholic beverages daily   1 caffeinated beverage daily   No drug use or other tobacco   Social Drivers of Corporate investment banker Strain: Not on file  Food Insecurity: Not on file  Transportation Needs: Not on file  Physical Activity: Not on file  Stress: Not on file  Social Connections: Not on file     Family History: The patient's family history includes Asthma in his son; Cancer in his sister; Heart disease in his father; Hypertension in his father; Ovarian cancer in his sister. There is no history of Colon cancer, Esophageal cancer, Stomach cancer, or Rectal cancer.  ROS:   Please see the history of present illness.     All other systems reviewed and are negative.  EKGs/Labs/Other Studies Reviewed:    The following studies were reviewed today:  EKG Interpretation Date/Time:  Friday July 28 2024 11:01:55 EDT Ventricular Rate:  73 PR Interval:  192 QRS Duration:  130 QT Interval:  400 QTC Calculation: 440 R Axis:   -46  Text Interpretation: Normal sinus rhythm Right bundle branch block Left anterior fascicular block Bifascicular block Confirmed by Darliss Rogue ([id number]) on 07/28/2024 11:14:54 AM    Recent Labs: 07/10/2024: ALT 20; BUN 10; Creatinine, Ser 1.02; Hemoglobin 14.5; Platelets 252; Potassium 3.6; Sodium 138  Recent Lipid Panel    Component Value Date/Time   CHOL 184 02/22/2024 1506   TRIG 56 02/22/2024 1506   HDL 52 02/22/2024 1506    CHOLHDL 3.5 02/22/2024 1506   LDLCALC 121 (H) 02/22/2024 1506     Risk Assessment/Calculations:             Physical Exam:    VS:  BP 119/80 (BP Location: Left Arm, Patient Position: Sitting, Cuff Size: Normal)   Pulse 73   Ht 5' 11 (1.803 m)   Wt 267 lb 12.8 oz (121.5 kg)   SpO2 99%   BMI 37.35  kg/m     Wt Readings from Last 3 Encounters:  07/28/24 267 lb 12.8 oz (121.5 kg)  07/17/24 270 lb (122.5 kg)  07/10/24 264 lb (119.7 kg)     GEN:  Well nourished, well developed in no acute distress HEENT: Normal NECK: No JVD; No carotid bruits CARDIAC: RRR, no murmurs, rubs, gallops RESPIRATORY:  Clear to auscultation without rales, wheezing or rhonchi  ABDOMEN: Soft, non-tender, non-distended MUSCULOSKELETAL:  No edema; No deformity  SKIN: Warm and dry NEUROLOGIC:  Alert and oriented x 3 PSYCHIATRIC:  Normal affect   ASSESSMENT:    1. Precordial pain   2. Primary hypertension   3. Pure hypercholesterolemia    PLAN:    In order of problems listed above:  Chest pain, several risk factors for CAD.  Order echo, obtain coronary CT to evaluate CAD. Hypertension, BP controlled.  Continue Norvasc  2.5 mg daily, HCTZ 12.5 mg daily. Hyperlipidemia, continue Pravachol  40 mg daily.  Consider switching to high intensity statin if significant CAD revealed on coronary CTA.  Follow-up after cardiac testing    Medication Adjustments/Labs and Tests Ordered: Current medicines are reviewed at length with the patient today.  Concerns regarding medicines are outlined above.  Orders Placed This Encounter  Procedures   CT CORONARY MORPH W/CTA COR W/SCORE W/CA W/CM &/OR WO/CM   Basic metabolic panel with GFR   EKG 87-Ozji   ECHOCARDIOGRAM COMPLETE   Meds ordered this encounter  Medications   metoprolol tartrate (LOPRESSOR) 100 MG tablet    Sig: TAKE 1 TABLET 2 HR PRIOR TO CARDIAC PROCEDURE    Dispense:  1 tablet    Refill:  0    Patient Instructions  Medication Instructions:   - take one 100 mg metoprolol two hours prior to cardiac CT *If you need a refill on your cardiac medications before your next appointment, please call your pharmacy*  Lab Work: Your provider would like for you to have following labs drawn today BMP.   If you have labs (blood work) drawn today and your tests are completely normal, you will receive your results only by: MyChart Message (if you have MyChart) OR A paper copy in the mail If you have any lab test that is abnormal or we need to change your treatment, we will call you to review the results.  Testing/Procedures: Your physician has requested that you have an echocardiogram. Echocardiography is a painless test that uses sound waves to create images of your heart. It provides your doctor with information about the size and shape of your heart and how well your heart's chambers and valves are working.   You may receive an ultrasound enhancing agent through an IV if needed to better visualize your heart during the echo. This procedure takes approximately one hour.  There are no restrictions for this procedure.  This will take place at [address] [facility]) [address], [place] [address]  Please note: We ask at that you not bring children with you during ultrasound (echo/ vascular) testing. Due to room size and safety concerns, children are not allowed in the ultrasound rooms during exams. Our front office staff cannot provide observation of children in our lobby area while testing is being conducted. An adult accompanying a patient to their appointment will only be allowed in the ultrasound room at the discretion of the ultrasound technician under special circumstances. We apologize for any inconvenience.   Your cardiac CT will be scheduled at:  [facility] [address] [place], [place] [address] [phone]  Please arrive 15 mins early for check-in and test prep.  There is spacious parking  and easy access to the radiology department from the [facility] entrance. Please enter here and check-in with the desk attendant.    Please follow these instructions carefully (unless otherwise directed):  An IV will be required for this test and Nitroglycerin will be given.  Hold all erectile dysfunction medications at least 3 days (72 hrs) prior to test. (Ie viagra, cialis, sildenafil, tadalafil, etc)    On the Night Before the Test: Be sure to Drink plenty of water. Do not consume any caffeinated/decaffeinated beverages or chocolate 12 hours prior to your test. Do not take any antihistamines 12 hours prior to your test.  On the Day of the Test: Drink plenty of water until 1 hour prior to the test. Do not eat any food 1 hour prior to test. You may take your regular medications prior to the test.  Take metoprolol (Lopressor) two hours prior to test. If you take Furosemide/Hydrochlorothiazide /Spironolactone/Chlorthalidone, please HOLD on the morning of the test.  After the Test: Drink plenty of water. After receiving IV contrast, you may experience a mild flushed feeling. This is normal. On occasion, you may experience a mild rash up to 24 hours after the test. This is not dangerous. If this occurs, you can take Benadryl  25 mg, Zyrtec, Claritin, or Allegra and increase your fluid intake. (Patients taking Tikosyn should avoid Benadryl , and may take Zyrtec, Claritin, or Allegra) If you experience trouble breathing, this can be serious. If it is severe call 911 IMMEDIATELY. If it is mild, please call our office.  We will call to schedule your test 2-4 weeks out understanding that some insurance companies will need an authorization prior to the service being performed.   For more information and frequently asked questions, please visit our website : [email or web address]  For non-scheduling related questions, please contact the cardiac imaging nurse navigator  should you have any questions/concerns: Cardiac Imaging Nurse Navigators Direct Office Dial: [phone]   For scheduling needs, including cancellations and rescheduling, please call [place], [phone].    Follow-Up: At [facility], you and your health needs are our priority.  As part of our continuing mission to provide you with exceptional heart care, our providers are all part of one team.  This team includes your primary Cardiologist (physician) and Advanced Practice Providers or APPs (Physician Assistants and Nurse Practitioners) who all work together to provide you with the care you need, when you need it.  Your next appointment:   3 month(s)  Provider:   You may see Dr Darliss or one of the following Advanced Practice Providers on your designated Care Team:   Lonni Meager, NP Lesley Maffucci, PA-C Bernardino Bring, PA-C [employer], PA-C Tylene Lunch, NP Barnie Hila, NP    We recommend signing up for the patient portal called MyChart.  Sign up information is provided on this After Visit Summary.  MyChart is used to connect with patients for Virtual Visits (Telemedicine).  Patients are able to view lab/test results, encounter notes, upcoming appointments, etc.  Non-urgent messages can be sent to your provider as well.   To learn more about what you can do with MyChart, go to [email or web address].              Signed, Redell Darliss, MD  07/28/2024 12:10 PM    [facility]

## 2024-07-28 NOTE — Patient Instructions (Addendum)
 Medication Instructions:  - take one 100 mg metoprolol two hours prior to cardiac CT *If you need a refill on your cardiac medications before your next appointment, please call your pharmacy*  Lab Work: Your provider would like for you to have following labs drawn today BMP.   If you have labs (blood work) drawn today and your tests are completely normal, you will receive your results only by: MyChart Message (if you have MyChart) OR A paper copy in the mail If you have any lab test that is abnormal or we need to change your treatment, we will call you to review the results.  Testing/Procedures: Your physician has requested that you have an echocardiogram. Echocardiography is a painless test that uses sound waves to create images of your heart. It provides your doctor with information about the size and shape of your heart and how well your heart's chambers and valves are working.   You may receive an ultrasound enhancing agent through an IV if needed to better visualize your heart during the echo. This procedure takes approximately one hour.  There are no restrictions for this procedure.  This will take place at [address] [facility]) [address], [place] [address]  Please note: We ask at that you not bring children with you during ultrasound (echo/ vascular) testing. Due to room size and safety concerns, children are not allowed in the ultrasound rooms during exams. Our front office staff cannot provide observation of children in our lobby area while testing is being conducted. An adult accompanying a patient to their appointment will only be allowed in the ultrasound room at the discretion of the ultrasound technician under special circumstances. We apologize for any inconvenience.   Your cardiac CT will be scheduled at:  [facility] [address] [place], [place] [address] [phone]  Please arrive 15 mins early for check-in and test  prep.  There is spacious parking and easy access to the radiology department from the [facility] entrance. Please enter here and check-in with the desk attendant.    Please follow these instructions carefully (unless otherwise directed):  An IV will be required for this test and Nitroglycerin will be given.  Hold all erectile dysfunction medications at least 3 days (72 hrs) prior to test. (Ie viagra, cialis, sildenafil, tadalafil, etc)    On the Night Before the Test: Be sure to Drink plenty of water. Do not consume any caffeinated/decaffeinated beverages or chocolate 12 hours prior to your test. Do not take any antihistamines 12 hours prior to your test.  On the Day of the Test: Drink plenty of water until 1 hour prior to the test. Do not eat any food 1 hour prior to test. You may take your regular medications prior to the test.  Take metoprolol (Lopressor) two hours prior to test. If you take Furosemide/Hydrochlorothiazide /Spironolactone/Chlorthalidone, please HOLD on the morning of the test.  After the Test: Drink plenty of water. After receiving IV contrast, you may experience a mild flushed feeling. This is normal. On occasion, you may experience a mild rash up to 24 hours after the test. This is not dangerous. If this occurs, you can take Benadryl  25 mg, Zyrtec, Claritin, or Allegra and increase your fluid intake. (Patients taking Tikosyn should avoid Benadryl , and may take Zyrtec, Claritin, or Allegra) If you experience trouble breathing, this can be serious. If it is severe call 911 IMMEDIATELY. If it is mild, please call our office.  We will call to schedule your test 2-4 weeks out understanding that some insurance companies will need an authorization prior to the service being performed.   For more information and frequently asked questions, please visit our website : [email or web address]  For non-scheduling related questions, please contact the  cardiac imaging nurse navigator should you have any questions/concerns: Cardiac Imaging Nurse Navigators Direct Office Dial: [phone]   For scheduling needs, including cancellations and rescheduling, please call [place], [phone].    Follow-Up: At [facility], you and your health needs are our priority.  As part of our continuing mission to provide you with exceptional heart care, our providers are all part of one team.  This team includes your primary Cardiologist (physician) and Advanced Practice Providers or APPs (Physician Assistants and Nurse Practitioners) who all work together to provide you with the care you need, when you need it.  Your next appointment:   3 month(s)  Provider:   You may see Dr Darliss or one of the following Advanced Practice Providers on your designated Care Team:   Lonni Meager, NP Lesley Maffucci, PA-C Bernardino Bring, PA-C Cadence [place], PA-C Tylene Lunch, NP Barnie Hila, NP    We recommend signing up for the patient portal called MyChart.  Sign up information is provided on this After Visit Summary.  MyChart is used to connect with patients for Virtual Visits (Telemedicine).  Patients are able to view lab/test results, encounter notes, upcoming appointments, etc.  Non-urgent messages can be sent to your provider as well.   To learn more about what you can do with MyChart, go to [email or web address].

## 2024-07-29 ENCOUNTER — Other Ambulatory Visit

## 2024-07-29 LAB — BASIC METABOLIC PANEL WITH GFR
BUN/Creatinine Ratio: 13 (ref 9–20)
BUN: 11 mg/dL (ref 6–24)
CO2: 25 mmol/L (ref 20–29)
Calcium: 9.5 mg/dL (ref 8.7–10.2)
Chloride: 101 mmol/L (ref 96–106)
Creatinine, Ser: 0.88 mg/dL (ref 0.76–1.27)
Glucose: 79 mg/dL (ref 70–99)
Potassium: 4.1 mmol/L (ref 3.5–5.2)
Sodium: 139 mmol/L (ref 134–144)
eGFR: 104 mL/min/1.73 (ref >59)

## 2024-08-09 ENCOUNTER — Ambulatory Visit: Admitting: Neurology

## 2024-08-09 ENCOUNTER — Encounter: Payer: Self-pay | Admitting: Neurology

## 2024-08-09 VITALS — BP 140/88 | HR 90 | Ht 68.0 in | Wt 269.4 lb

## 2024-08-09 DIAGNOSIS — Z6841 Body Mass Index (BMI) 40.0 and over, adult: Secondary | ICD-10-CM

## 2024-08-09 DIAGNOSIS — R5383 Other fatigue: Secondary | ICD-10-CM

## 2024-08-09 DIAGNOSIS — E66813 Obesity, class 3: Secondary | ICD-10-CM

## 2024-08-09 DIAGNOSIS — Z9989 Dependence on other enabling machines and devices: Secondary | ICD-10-CM

## 2024-08-09 DIAGNOSIS — G4733 Obstructive sleep apnea (adult) (pediatric): Secondary | ICD-10-CM | POA: Diagnosis not present

## 2024-08-09 NOTE — Telephone Encounter (Signed)
 printed

## 2024-08-09 NOTE — Patient Instructions (Addendum)
 would like to thank Georgina Speaks, FNP and Georgina Speaks, Fnp [address] [place],  [place] [address] for allowing me to meet with @PTNAME @. 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     His apnea control appears very good -also no recent baseline is available.   He is obese.  Started on Zepbound in January - its covered on his health plan.  He already lost 52 pounds but lost energy (!).   Risk factors for OSA were present,  including : Body mass index is 40.96 kg/m., neck size of > 19 inches  and upper airway anatomy. ( High grade Mallampotti)       My Plan is to proceed with:   HST/ or TITRATION/ SPLIT/  rule out hypoxia as cause for persistent hypersomnia and fatigue.    Added SPEP to the labs.    I plan to follow up personally or through our NP within 4-5 months.    A total time of  5  minutes consistent of a part of face to face encounter , exam and interview,  and additional preparation time for chart review was spent .  At today's visit, we discussed treatment options, associated risk and benefits, and engage in counseling as needed including, but not limited to:  Sleep hygiene, Quality Sleep Habits, and Safety concerns for patients with daytime sleepiness who are warned to not operate machinery/ motor vehicles when drowsy.   Risk factors for sleep apnea were identified:  Body mass index is 40.96 kg/m..  Additionally, the following were reviewed: Past medical records, past medical and surgical history, family and social background, as well as relevant laboratory results, imaging findings, and medical notes, where applicable.  This note was generated by myself in part by using dictation software, and as a result, it may contain unintentional typos and errors.  Nevertheless, effort was made to accurately convey the pertinent aspects of  the patient's visit.     Hypersomnia Hypersomnia is a condition in which a person feels very tired during the day even though the person gets plenty of sleep at night. A person with this condition may take naps during the day and may find it very difficult to wake up from sleep. Hypersomnia may affect a person's ability to think, concentrate, drive, or remember things. What are the causes? The cause of this condition may not be known. Possible causes include: Taking certain medicines. Using drugs or alcohol. Sleep disorders, such as narcolepsy and sleep apnea. Injury to the head, brain, or spinal cord. Tumors. Certain medical conditions. These include: Depression. Diabetes. Gastroesophageal reflux disease (GERD). An underactive thyroid gland (hypothyroidism). What are the signs or symptoms? The main symptoms of hypersomnia include: Feeling very tired throughout the day, regardless of how much sleep you got the night before. Having trouble waking up. Others may find it difficult to wake you up when you are sleeping. Sleeping for longer and longer periods at a time. Taking naps throughout the day. Other symptoms may include: Feeling restless, anxious, or annoyed. Lacking energy. Having trouble with: Remembering. Speaking. Thinking. Loss of appetite. Seeing, hearing, tasting, smelling, or feeling things that are not real (hallucinations). How is this diagnosed? This condition may be diagnosed based on: Your symptoms and medical history. Your sleeping habits. Your health care provider may ask you to  write down your sleeping habits in a daily sleep log, along with any symptoms you have. A series of tests that are done while you sleep (sleep study or polysomnogram). A test that measures how quickly you can fall asleep during the day (daytime nap study or multiple sleep latency test). How is this treated? This condition may be treated by: Following a regular sleep routine. Making  lifestyle changes, such as changing your eating habits, getting regular exercise, and avoiding alcohol or caffeinated beverages. Taking medicines to make you more alert (stimulants) during the day. Treating any underlying medical causes of hypersomnia. Follow these instructions at home: Sleep habits Stick to a routine that includes going to bed and waking up at the same times every day and night. Practice a relaxing bedtime routine. This may include reading, meditation, deep breathing, or taking a warm bath before going to sleep. Exercise regularly as told by your health care provider. However, avoid exercising in the hours right before bedtime. Keep your sleep environment at a cooler temperature, darkened, and quiet. Sleep with pillows and a mattress that are comfortable and supportive. Schedule short 20-minute naps for when you feel sleepiest during the day. Talk with your employer or teachers about your hypersomnia. If possible, adjust your schedule so that: You have a regular daytime work schedule. You can take a scheduled nap during the day. You do not have to work or be active at night. Do not eat a heavy meal for a few hours before bedtime. Eat your meals at about the same times every day. Safety  Do not drive or use machinery if you are sleepy. Ask your health care provider if it is safe for you to drive. Wear a life jacket when swimming or spending time near water. General instructions  Take over-the-counter and prescription medicines only as told by your health care provider. This includes supplements. Avoid drinking alcohol or caffeinated beverages. Keep a sleep log that will help your health care provider manage your condition. This may include information about: What time you go to bed each night. How often you wake up at night. How many hours you sleep at night. How often and for how long you nap during the day. Any observations from others, such as leg movements during  sleep, sleep walking, or snoring. Keep all follow-up visits. This is important. Contact a health care provider if: You have new symptoms. Your symptoms get worse. Get help right away if: You have thoughts about hurting yourself or someone else. Get help right away if you feel like you may hurt yourself or others, or have thoughts about taking your own life. Go to your nearest emergency room or: Call 911. Call the National Suicide Prevention Lifeline at 959-740-2684 or 988. This is open 24 hours a day. Text the Crisis Text Line at 646-422-8281. Summary Hypersomnia refers to a condition in which you feel very tired during the day even though you get plenty of sleep at night. A person with this condition may take naps during the day and may find it very difficult to wake up from sleep. Hypersomnia may affect a person's ability to think, concentrate, drive, or remember things. Treatment may include a regular sleep routine and making some lifestyle changes. This information is not intended to replace advice given to you by your health care provider. Make sure you discuss any questions you have with your health care provider. Document Revised: 09/08/2021 Document Reviewed: 09/08/2021 Elsevier Patient Education  2024 [employer]Fatigue If  you have fatigue, you feel tired all the time and have a lack of energy or a lack of motivation. Fatigue may make it difficult to start or complete tasks because of exhaustion. Occasional or mild fatigue is often a normal response to activity or life. However, long-term (chronic) or extreme fatigue may be a symptom of a medical condition such as: Depression. Not having enough red blood cells or hemoglobin in the blood (anemia). A problem with a small gland located in the lower front part of the neck (thyroid disorder). Rheumatologic conditions. These are problems related to the body's defense system (immune system). Infections, especially certain viral  infections. Fatigue can also lead to negative health outcomes over time. Follow these instructions at home: Medicines Take over-the-counter and prescription medicines only as told by your health care provider. Take a multivitamin if told by your health care provider. Do not use herbal or dietary supplements unless they are approved by your health care provider. Eating and drinking  Avoid heavy meals in the evening. Eat a well-balanced diet, which includes lean proteins, whole grains, plenty of fruits and vegetables, and low-fat dairy products. Avoid eating or drinking too many products with caffeine in them. Avoid alcohol. Drink enough fluid to keep your urine pale yellow. Activity  Exercise regularly, as told by your health care provider. Use or practice techniques to help you relax, such as yoga, tai chi, meditation, or massage therapy. Lifestyle Change situations that cause you stress. Try to keep your work and personal schedules in balance. Do not use recreational or illegal drugs. General instructions Monitor your fatigue for any changes. Go to bed and get up at the same time every day. Avoid fatigue by pacing yourself during the day and getting enough sleep at night. Maintain a healthy weight. Contact a health care provider if: Your fatigue does not get better. You have a fever. You suddenly lose or gain weight. You have headaches. You have trouble falling asleep or sleeping through the night. You feel angry, guilty, anxious, or sad. You have swelling in your legs or another part of your body. Get help right away if: You feel confused, feel like you might faint, or faint. Your vision is blurry or you have a severe headache. You have severe pain in your abdomen, your back, or the area between your waist and hips (pelvis). You have chest pain, shortness of breath, or an irregular or fast heartbeat. You are unable to urinate, or you urinate less than normal. You have  abnormal bleeding from the rectum, nose, lungs, nipples, or, if you are male, the vagina. You vomit blood. You have thoughts about hurting yourself or others. These symptoms may be an emergency. Get help right away. Call 911. Do not wait to see if the symptoms will go away. Do not drive yourself to the hospital. Get help right away if you feel like you may hurt yourself or others, or have thoughts about taking your own life. Go to your nearest emergency room or: Call 911. Call the National Suicide Prevention Lifeline at 4795192614 or 988. This is open 24 hours a day. Text the Crisis Text Line at 364-410-5613. Summary If you have fatigue, you feel tired all the time and have a lack of energy or a lack of motivation. Fatigue may make it difficult to start or complete tasks because of exhaustion. Long-term (chronic) or extreme fatigue may be a symptom of a medical condition. Exercise regularly, as told by your health care provider. Change  situations that cause you stress. Try to keep your work and personal schedules in balance. This information is not intended to replace advice given to you by your health care provider. Make sure you discuss any questions you have with your health care provider. Document Revised: 07/21/2021 Document Reviewed: 07/21/2021 Elsevier Patient Education  2024 [employer].

## 2024-08-09 NOTE — Progress Notes (Addendum)
 @[facility]   Provider:  Dedra Gores, MD   Primary Care Physician:  Georgina Speaks, FNP [address] [place] [address]   Referring Provider: Georgina Speaks, Fnp [address] [place],  [place] [address]        Chief Concern for this Consultation:   Patient presents with          HPI: I have the pleasure of meeting with Troy Olson , on 08/09/24 , who is a 53 y.o.  male patient,  seen upon a referral by PCP  for a  Sleep Medicine Consultation.  The patient's referral information asked for a new evaluation, last PSG  was at [facility], in 2006. None since. Has been on CPAP ever since,  and now on his third machine , u issued just last year 05-2023.  SABRA  Chief concern according to patient:  Troy Olson presented with a medical history of :  Past Medical History:  Diagnosis Date   Abscess of axilla, left 10/12/2017   Allergies    Allergy    Arthritis, juvenile rheumatoid  arthritis. DDD    Asthma    Shoulder arthritis    Eczema    Edema of both lower extremities    Food allergy    HLD (hyperlipidemia)    Hypertension    Morbid obesity (HCC) BMI now 41. Op on Zepbound.     Prediabetes, not diabetic.     Sleep apnea, OSTRUCTIVE  dx 2005  or 2006     CPAP  Hypotestosteronemia-   dx 1 month ago ( 2025)    Sleep relevant medical/ surgical and symptom history:  doing well with his current , reliable new CPAP and  is CPAP dependent.    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   This patient had a previous sleep study/ studies in the year 2005 (!)  he had an anaphylactic reaction to a peanut  containing taco. 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 His cholesterol had been high  over the last 5 years but is now corrected his LDL is still 121 his HDL was 52 total cholesterol 184.  He had a positive ANA titer.  The antibody panel showed speckled patterns and this is a widely connective autoimmune factor it could be rheumatic disease connective tissue disease Sjogren's or lupus scleroderma myositis etc.  IGE positive, not alpha gal ?      Family medical history: There are no biological family members affected by Sleep apnea.     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 . he lives in a private home, in a household with spouse,  and has 2 dogs, college age daughter . The patient currently works sometimes  in shifts (night/ rotating,) until . The workplace involves outdoor activity, Nicotine use: cigars - daily.   ETOH use: / weekends 2-4 drinks a week. ,  Caffeine intake in form of: Coffee (/), Soft drinks (/), Tea ( green- one in AM ) or Energy drinks ( including those containing  taurine ). Caffeine is last consumed at 9 AM.  Exercises not regularly .      Sleep habits and routines are as follows:  The patient's dinner time is around 6-7 PM.  Evening time is spent by  watching TV. The patient goes to bed at, or close to, 10-11 PM.  The bedroom is shared with spouse  and is described as cool, quiet, and dark.  The patient reports that it takes 10 minutes to fall asleep, then continues to sleep for 6 hours, uninterrupted or woken up once, by the need to void (Nocturia).   The preferred sleep position is side sleep , with support of 1 pillow, (non- adjustable bed/ recliner ). The total estimated sleep time is circa 6-7 hours.  Dreams are reportedly rare/ and can be vivid. Dream enactment has not been reported.   5 AM is the usual week- day rise time. The patient wakes up spontaneously before an alarm set at 5.25 .  he reports mostly/ feeling refreshed and restored in the morning, waking with symptoms such as dry mouth ( NEW ! CPAP  without humidifier) ,  stiffness or pain, and fatigue.  No sleep paralysis has been experienced.  Naps in daytime are NEW -  taken infrequently (there is a desire to nap and opportunity), lasting from 15 to 30 minutes  and have a refreshing quality. These do not interfere with nocturnal sleep.    PCP : Troy Olson is a 53 year old male with hypertension and prediabetes who presents with chest pain and back pain.   He experiences severe chest pain described as 'squeezing' in the center of his chest radiating to his back, which occurred last Monday and lasted for about an hour. He did not take any aspirin or additional medications at that time, but was transported to the hospital by ambulance. He continues to experience intermittent nagging chest discomfort and back pain, which he describes as non-muscular in nature. No muscle tension associated with chest pain.   He has a history of hypertension. He is managing prediabetes with metformin . He acknowledges recent dietary lapses, which he believes contributed to a slight weight gain, although he has been losing weight steadily prior to this.   He uses a CPAP machine nightly for sleep apnea, although he has not had a sleep study in over five years. He mentions that his CPAP machine is set to the maximum setting. 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   He reports ongoing back pain for which he has been receiving treatment at an orthopedic clinic. Previous treatments, including injections, have not been effective.   He experiences fatigue, noting that he feels tired around midday and sometimes takes naps, which is a new development for him. He has also experienced a decrease in interest in exercise.   Review of Systems: Out of a complete 14 system review, the patient complains of only the following symptoms, and all other reviewed systems are negative.:  How likely are you to doze in the following situations: 0 = not  likely, 1 = slight chance, 2 = moderate chance, 3 = high chance Sitting and Reading? Watching Television? Sitting inactive in a public place (theater or meeting)? As a passenger in a car for an hour without a break? Lying down in the afternoon when circumstances permit? Sitting and talking to someone? Sitting quietly after lunch without alcohol? In a car, while stopped for a few minutes in traffic?   Total ESS =11 / 24 points.    FSS endorsed at 41/ 63 points.  GDS:  Social History   Socioeconomic History   Marital  status: Married    Spouse name: Rosaline    Number of children: 3   Years of education: Not on file   Highest education level: Not on file  Occupational History   Occupation: Truck driver  Tobacco Use   Smoking status: Former    Current packs/day: 0.00    Average packs/day: 0.5 packs/day for 20.0 years (10.0 ttl pk-yrs)    Types: Cigars, Cigarettes    Start date: 03/13/1991    Quit date: 03/13/2011    Years since quitting: 13.4   Smokeless tobacco: Never   Tobacco comments:    Smokes cigars occasionally.  Vaping Use   Vaping status: Never Used  Substance and Sexual Activity   Alcohol use: Yes    Comment: occasionally--dark liquer 4-5 drinks consumed weekly   Drug use: No   Sexual activity: Yes    Birth control/protection: None  Other Topics Concern   Not on file  Social History Narrative   Patient is married with 2 sons and 1 daughter   He owns a dump truck   Former smoker   3 alcoholic beverages daily   1-2 caffeinated beverage daily   No drug use or other tobacco   Social Drivers of Community Education Officer: Not on [employer] Insecurity: Not on file  Transportation Needs: Not on file  Physical Activity: Not on file  Stress: Not on file  Social Connections: Not on file    Family History  Problem Relation Age of Onset   Hypertension Father    Heart disease Father        stents placed   Cancer Sister    Ovarian cancer Sister         male cancer ? if ovaries   Asthma Son    Colon cancer Neg Hx    Esophageal cancer Neg Hx    Stomach cancer Neg Hx    Rectal cancer Neg Hx    Stroke Neg Hx    Seizures Neg Hx    Sleep apnea Neg Hx     Past Medical History:  Diagnosis Date   Abscess of axilla, left 10/12/2017   Allergies    Allergy    Arthritis    Asthma    Back pain    Eczema    Edema of both lower extremities    Food allergy    HLD (hyperlipidemia)    Hypertension    Morbid obesity (HCC)    Prediabetes    Sleep apnea    CPAP    Past Surgical History:  Procedure Laterality Date   COLONOSCOPY  01/27/2021   Normal   MOUTH SURGERY     widsom teeth     Current Outpatient Medications on File Prior to Visit  Medication Sig Dispense Refill   albuterol  (VENTOLIN  HFA) 108 (90 Base) MCG/ACT inhaler Inhale 2 puffs into the lungs every 6 (six) hours as needed. wheezing 1 each 5   amLODipine  (NORVASC ) 2.5 MG tablet Take 1 tablet (2.5 mg total) by mouth daily. 90 tablet 1   Blood Pressure Monitoring (BLOOD PRESSURE MONITOR/L CUFF) MISC Use to check blood pressure 1 each 0   cetirizine (ZYRTEC) 10 MG tablet Take 10 mg by mouth daily.     EPINEPHrine  (EPIPEN  2-PAK) 0.3 mg/0.3 mL IJ SOAJ injection Inject 0.3 mg into the muscle as needed for anaphylaxis. 1 each 5   hydrochlorothiazide  (MICROZIDE ) 12.5 MG capsule TAKE 1 CAPSULE BY MOUTH TWICE  DAILY 180 capsule 3  ipratropium-albuterol  (DUONEB) 0.5-2.5 (3) MG/3ML SOLN Take 3 mLs by nebulization every 4 (four) hours as needed for up to 14 days. 180 mL 0   metFORMIN  (GLUCOPHAGE ) 1000 MG tablet TAKE 1 TABLET BY MOUTH TWICE  DAILY WITH MEALS 180 tablet 3   metoprolol tartrate (LOPRESSOR) 100 MG tablet TAKE 1 TABLET 2 HR PRIOR TO CARDIAC PROCEDURE 1 tablet 0   montelukast  (SINGULAIR ) 10 MG tablet TAKE 1 TABLET BY MOUTH DAILY 90 tablet 3   naproxen  (NAPROSYN ) 500 MG tablet Take 1 tablet (500 mg total) by mouth 2 (two) times daily with a meal. 30 tablet 0   pravastatin   (PRAVACHOL ) 40 MG tablet TAKE 1 TABLET BY MOUTH IN THE  EVENING 90 tablet 3   testosterone (ANDRODERM) 4 MG/24HR PT24 patch Place 1 patch onto the skin daily. (Patient taking differently: Place 1 patch onto the skin daily. Has not started) 30 patch 3   traMADol (ULTRAM) 50 MG tablet Take 1 tablet (50 mg total) by mouth every 8 (eight) hours as needed. 20 tablet 0   triamcinolone  ointment (KENALOG ) 0.1 % Apply 1 Application topically 2 (two) times daily as needed (rash flare). Do not use on the face, neck, armpits or groin area. Do not use more than 3 weeks in a row. 30 g 2   ZEPBOUND 7.5 MG/0.5ML Pen SMARTSIG:7.5 Milligram(s) SUB-Q Once a Week     clobetasol  cream (TEMOVATE ) 0.05 % Apply 1 Application topically 2 (two) times daily. (Patient not taking: Reported on 08/09/2024) 60 g 3   Magnesium  250 MG TABS Take 1 tablet (250 mg total) by mouth daily. With evening meal (Patient not taking: Reported on 08/09/2024) 30 tablet 0   scopolamine  (TRANSDERM-SCOP) 1 MG/3DAYS Place 1 patch (1.5 mg total) onto the skin every 3 (three) days. (Patient not taking: Reported on 08/09/2024) 4 patch 0   No current facility-administered medications on file prior to visit.    Allergies  Allergen Reactions   Food Allergy Formula Shortness Of Breath and Swelling    Any nuts of any kind.   Peanut -Containing Drug Products Shortness Of Breath, Diarrhea, Nausea And Vomiting and Swelling    Vitals:   08/09/24 0847  BP: (!) 140/88  Pulse: 90  SpO2: 98%   DOT river on RESMED cpap, 97% compliance over 90 days! Residual  AHI 2.24 /h     Physical exam:   General: The patient was alert and appears not in acute distress.  Mood and affect are appropriate .  The patient's interactions are: Cooperative, makes eye contact, follows the instructions and answers questions coherently.  The patient is groomed and appropriately groomed and dressed. Head: Normocephalic, atraumatic.  Neck is supple. Mallampati:  3 plus - .   The neck circumference measured 20 inches. Nasal airflow was patent ,   Overbite / Retrognathia was noted.  Dental status:  Cardiovascular:  Regular rate and cardiac rhythm by palpable pulse. Respiratory: no audible wheezing, no tachypnoea.   Skin:  Without evidence of ankle edema. No discoloration.  Trunk:  BMI is 40 plus .  The patient's posture was erect.   Neurologic exam : The patient was awake and alert, oriented to place and time.   Attention span & concentration ability appeared normal.  Speech was fluent, without dysarthria, dysphonia or aphasia, and of normal volume.     Cranial nerves:  There was no loss of smell or taste reported  Pupils are round, equal in size and briskly reactive to light.  Funduscopic  exam was deferred.  Extraocular movements in vertical and horizontal planes were intact and without nystagmus. (No Diplopia reported). Visual fields by finger perimetry are intact. Hearing was intact to soft voice.    Facial sensation intact to fine touch.  Facial motor strength: Symmetric movement and tongue and uvula move midline.  Neck ROM: rotation, tilt and flexion extension were intact for age and shoulder shrug was symmetrical.    Motor exam:  Symmetric bulk, strength and ROM.   Normal tone without cog- wheeling, and symmetric grip strength.   Sensory:  Fine touch and vibration were tested by tuning fork and intact.  Proprioception tested in the upper extremities was normal.   Coordination: The patient reported no problems with button closure and no changes to penmanship.   The Finger-to-nose maneuver was intact without evidence of ataxia, dysmetria or tremor.   Gait and station: Patient could rise unassisted from a seated position, without bracing, and walked without assistive device.  Stance was of normal/ wide width. T  Deep tendon reflexes: Upper extremities did show symmetric DTRs. Lower extremity DTRs were symmetric and brisk/ attenuated.      I  would like to thank Georgina Speaks, FNP and Georgina Speaks, Fnp [address] [place],  [place] [address] for allowing me to meet with your patient .     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    His apnea control appears very good -also no recent baseline is available.   He is obese.  Started on Zepbound in January - its covered on his health plan.  He already lost 52 pounds but lost energy (!).  Risk factors for OSA were present,  including : Body mass index is 40.96 kg/m., neck size of > 19 inches  and upper airway anatomy. ( High grade Mallampotti)     My Plan is to proceed with:  HST/ or TITRATION/ SPLIT/  rule out hypoxia as cause for persistent hypersomnia and fatigue.   Added SPEP to the labs.   Modafinil  discussed , but no need at this time. (DOT driver  with OSA and residual hypersomnia, fatigue).   I plan to follow up personally or through our NP within 4-5 months.   A total time of  >55  minutes consistent of a part of face to face encounter , exam and interview,  and additional preparation time for chart review was spent Summary of labs .  At today's visit, we discussed treatment options, associated risk and benefits, and engage in counseling as needed including, but not limited to:  Sleep hygiene, Quality Sleep Habits, and Safety concerns for patients with daytime sleepiness who are warned to not operate machinery/ motor vehicles when drowsy.  Risk factors for sleep apnea were identified:  Body mass index is 40.96 kg/m..  Additionally, the following were reviewed: Past medical records, past medical and surgical history, family and social background, as well as relevant laboratory results, imaging findings, and medical notes, where applicable.  This note was generated by myself in part by using dictation software, and as a  result, it may contain unintentional typos and errors.  Nevertheless, effort was made to accurately convey the pertinent aspects of the patient's visit.   Dedra Gores, MD   [facility] and [facility] Sleep Board certified in Sleep Medicine  by The [employer] of Sleep Medicine and Diplomate of the [employer] of Sleep [employer] (AASM) . Board certified In Neurology, Diplomat of the ABPN,  Fellow of the [employer] of Neurology.

## 2024-08-12 LAB — PROTEIN ELECTROPHORESIS, SERUM
A/G Ratio: 1.1 (ref 0.7–1.7)
Albumin ELP: 4 g/dL (ref 2.9–4.4)
Alpha 1: 0.2 g/dL (ref 0.0–0.4)
Alpha 2: 0.7 g/dL (ref 0.4–1.0)
Beta: 1.3 g/dL (ref 0.7–1.3)
Gamma Globulin: 1.6 g/dL (ref 0.4–1.8)
Globulin, Total: 3.8 g/dL (ref 2.2–3.9)
Total Protein: 7.8 g/dL (ref 6.0–8.5)

## 2024-08-12 LAB — TSH+FREE T4
Free T4: 1.27 ng/dL (ref 0.82–1.77)
TSH: 1.11 u[IU]/mL (ref 0.450–4.500)

## 2024-08-14 ENCOUNTER — Encounter: Payer: Self-pay | Admitting: Radiology

## 2024-08-14 ENCOUNTER — Ambulatory Visit: Payer: Self-pay | Admitting: Neurology

## 2024-08-16 ENCOUNTER — Encounter (HOSPITAL_COMMUNITY): Payer: Self-pay

## 2024-08-17 ENCOUNTER — Ambulatory Visit: Payer: Self-pay | Admitting: Cardiology

## 2024-08-17 ENCOUNTER — Ambulatory Visit
Admission: RE | Admit: 2024-08-17 | Discharge: 2024-08-17 | Disposition: A | Discharge status: Home / Self Care | Source: Ambulatory Visit | Attending: Cardiology | Admitting: Cardiology

## 2024-08-17 DIAGNOSIS — R072 Precordial pain: Secondary | ICD-10-CM | POA: Diagnosis present

## 2024-08-17 MED ORDER — IOHEXOL 350 MG/ML SOLN
100.0000 mL | Freq: Once | INTRAVENOUS | Status: AC | PRN
  Administered 2024-08-17: 100 mL via INTRAVENOUS

## 2024-08-17 MED ORDER — NITROGLYCERIN 0.4 MG SL SUBL
0.8000 mg | SUBLINGUAL_TABLET | Freq: Once | SUBLINGUAL | Status: AC
  Administered 2024-08-17: 0.8 mg via SUBLINGUAL
  Filled 2024-08-17: qty 25

## 2024-08-17 NOTE — Progress Notes (Signed)
 Patient tolerated CT well. Vital signs stable encourage to drink water throughout day.Reasons explained and verbalized understanding. Ambulated steady gait.

## 2024-08-24 ENCOUNTER — Telehealth: Payer: Self-pay | Admitting: Neurology

## 2024-08-24 NOTE — Telephone Encounter (Signed)
 Split [employer] pending.

## 2024-08-28 ENCOUNTER — Ambulatory Visit: Admitting: Nurse Practitioner

## 2024-08-28 NOTE — Telephone Encounter (Signed)
 HST [employer] no auth req Split denied see below for the denial.

## 2024-09-12 ENCOUNTER — Ambulatory Visit: Attending: Cardiology

## 2024-09-12 DIAGNOSIS — R072 Precordial pain: Secondary | ICD-10-CM

## 2024-09-12 LAB — ECHOCARDIOGRAM COMPLETE
AR max vel: 2.16 cm2
AV Area VTI: 2.18 cm2
AV Area mean vel: 2.15 cm2
AV Mean grad: 4 mmHg
AV Peak grad: 7.7 mmHg
Ao pk vel: 1.39 m/s
Area-P 1/2: 3.99 cm2
S' Lateral: 3.45 cm

## 2024-09-15 ENCOUNTER — Ambulatory Visit (INDEPENDENT_AMBULATORY_CARE_PROVIDER_SITE_OTHER): Admitting: Neurology

## 2024-09-15 DIAGNOSIS — R5383 Other fatigue: Secondary | ICD-10-CM

## 2024-09-15 DIAGNOSIS — Z6841 Body Mass Index (BMI) 40.0 and over, adult: Secondary | ICD-10-CM

## 2024-09-15 DIAGNOSIS — G4733 Obstructive sleep apnea (adult) (pediatric): Secondary | ICD-10-CM | POA: Diagnosis not present

## 2024-09-15 DIAGNOSIS — Z9989 Dependence on other enabling machines and devices: Secondary | ICD-10-CM

## 2024-09-21 ENCOUNTER — Inpatient Hospital Stay
Admission: RE | Admit: 2024-09-21 | Discharge: 2024-09-21 | Attending: Physical Medicine and Rehabilitation | Admitting: Physical Medicine and Rehabilitation

## 2024-09-21 DIAGNOSIS — M47816 Spondylosis without myelopathy or radiculopathy, lumbar region: Secondary | ICD-10-CM

## 2024-09-21 DIAGNOSIS — G8929 Other chronic pain: Secondary | ICD-10-CM

## 2024-09-25 ENCOUNTER — Ambulatory Visit: Payer: Self-pay

## 2024-09-28 NOTE — Progress Notes (Signed)
 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  The patient's referral information asked for a new evaluation, last PSG  was at [facility] in 2006. None since.  Has been on CPAP ever since,  and now on his third machine , issued just last year 05-2023. No ENT surgery , seasonal rhinitis, Juvenile rheumatoid arthritis at age 89 ( Autoimmune disorder), DOT ; Panic attack , anxiety- ED evaluation was negative, cardiology is following.    This patient had a previous sleep study/ studies in the year 2005 (!)  he had an anaphylactic reaction to a peanut  containing taco. 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 His cholesterol had been high over the last 5 years but is now corrected his LDL is still 121 his HDL was 52 total cholesterol 184. He has Asthma.   He had a positive ANA titer.  The antibody panel showed speckled patterns and this is a widely connective autoimmune factor it could be rheumatic disease connective tissue disease Sjogren's or lupus scleroderma myositis etc.  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     His apnea control appears very good -also no recent baseline is available.   He is obese. Started on Zepbound in January - its covered on his health plan.  He already lost 52 pounds but  lost energy (!).   Risk factors for OSA were present,  including : Body mass index is 40.96 kg/m., neck size of > 19 inches  and upper airway anatomy. ( High grade Mallampotti)       My Plan is to proceed with:   HST/ or TITRATION/ SPLIT/  rule out hypoxia as cause for persistent hypersomnia and fatigue.    Added SPEP to the labs.    Modafinil  discussed , but no need at this time. (DOT driver  with OSA and residual hypersomnia, fatigue).     Epworth sleepiness score: 11 / 24 points.    FSS endorsed at 41/ 63 points.  BMI:  41 kg/m  Neck Circumference: 20    Sleep Summary:    Start of  Recording Time (hours, min):  09-19-2024 at 23:41 hours        Total Sleep Time (hours, min):   5 h 36 minutes   Sleep efficiency %;    81%                                   Respiratory Indices by AASM criteria of scoring;    Calculated pAHI (per hour):     30.6/h  Positional  respiratory activity  / snoring : AHI was worse in supine sleep at 36/h and was 11. 6 /h in non supine sleep.   Oxygen Saturation  in Sleep    Oxygen Saturation (%) Mean:     92% and  O2 Saturation Range (%) was from 68% through 98%                                       O2 Saturation (minutes) <89%:   20 minutes        Pulse Rate in Sleep : irregular heart rate.    Pulse Mean (bpm):  89 bpm               Pulse Range:  from 74 bpm through 103 bpm                IMPRESSION:  This HST confirms the presence of severe sleep apnea, ( presumed to be obstructive apnea )with supine sleep exacerbation and  irregular heart rates, loud intermittent snoring, and hypoxia of sleep.    RECOMMENDATION: I strongly recommend to continue CPAP use, and also to continue weight loss with / without medication.  The current CPAP device is rather new and as long as its working, we wont need to order another one.  Please order ONO on CPAP to rule out persistent hypoxia- this could explain  fatigue, as can autoimmune disorders.  Core strength training may be needed to reduce apnea/ hypopnea, hypoxia.  Avoiding supine sleep will reduce apnea to the mild category.     Any Patient endorsing a high level of sleepiness should be cautioned not to drive, work at heights, or operate dangerous machinery or heavy equipment when tired or sleepy.  Review of good sleep hygiene measures took place in the initial consultation but should be revisited ( Your guide to better sleep  a publication by the NIH is a good source of information).   The referring provider will be notified of the test results.    I certify that I have reviewed the raw data recording prior to the issuance of this report in accordance with the standards of the American Academy of Sleep Medicine (AASM).    INTERPRETING PHYSICIAN:   Dedra Gores, MD  [facility] Sleep Board certified by The [employer] of Sleep Medicine and Diplomate of the [employer] of Sleep Medicine. Board certified In Neurology through the ABPN, Fellow of the [employer] Neu[employer].          "

## 2024-10-06 ENCOUNTER — Ambulatory Visit: Admitting: Physical Medicine and Rehabilitation

## 2024-10-06 ENCOUNTER — Encounter: Payer: Self-pay | Admitting: Physical Medicine and Rehabilitation

## 2024-10-06 DIAGNOSIS — G8929 Other chronic pain: Secondary | ICD-10-CM

## 2024-10-06 DIAGNOSIS — M47819 Spondylosis without myelopathy or radiculopathy, site unspecified: Secondary | ICD-10-CM

## 2024-10-06 DIAGNOSIS — M545 Low back pain, unspecified: Secondary | ICD-10-CM | POA: Diagnosis not present

## 2024-10-06 DIAGNOSIS — M7918 Myalgia, other site: Secondary | ICD-10-CM | POA: Diagnosis not present

## 2024-10-06 DIAGNOSIS — M47816 Spondylosis without myelopathy or radiculopathy, lumbar region: Secondary | ICD-10-CM

## 2024-10-06 NOTE — Progress Notes (Signed)
 Pain Scale   Average Pain 10 Patient advising his pain comes and goes depending on activity. Patient here for MRI review.         +Driver, -BT, -Dye Allergies.
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
## 2024-10-10 DIAGNOSIS — Z9989 Dependence on other enabling machines and devices: Secondary | ICD-10-CM | POA: Insufficient documentation

## 2024-10-10 DIAGNOSIS — G4733 Obstructive sleep apnea (adult) (pediatric): Secondary | ICD-10-CM | POA: Insufficient documentation

## 2024-10-10 NOTE — Procedures (Signed)
 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  The patient's referral information asked for a new evaluation, last PSG  was at [facility] in 2006. None since.  Has been on CPAP ever since,  and now on his third machine , issued just last year 05-2023. 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    This patient had a previous sleep study/ studies in the year 2005 (!)  he had an anaphylactic reaction to a peanut  containing taco. 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 His cholesterol had been high over the last 5 years but is now corrected his LDL is still 121 his HDL was 52 total cholesterol 184. He has Asthma.   He had a positive ANA titer.  The antibody panel showed speckled patterns and this is a widely connective autoimmune factor it could be rheumatic disease connective tissue disease Sjogren's or lupus scleroderma myositis etc.  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     His apnea control appears very good -also no recent baseline is available.   He is obese. Started on Zepbound in January - its covered on his health plan.  He already lost 52 pounds but lost  energy (!).   Risk factors for OSA were present,  including : Body mass index is 40.96 kg/m., neck size of > 19 inches  and upper airway anatomy. ( High grade Mallampotti)       My Plan is to proceed with:   HST/ or TITRATION/ SPLIT/  rule out hypoxia as cause for persistent hypersomnia and fatigue.    Added SPEP to the labs.    Modafinil  discussed , but no need at this time. (DOT driver  with OSA and residual hypersomnia, fatigue).     Epworth sleepiness score: 11 / 24 points.    FSS endorsed at 41/ 63 points.  BMI:  41 kg/m  Neck Circumference: 20    Sleep Summary:    Start of  Recording Time (hours, min):  09-19-2024 at 23:41 hours        Total Sleep Time (hours, min):   5 h 36 minutes   Sleep efficiency %;    81%                                   Respiratory Indices by AASM criteria of scoring;    Calculated pAHI (per hour):     30.6/h  Positional  respiratory activity  / snoring : AHI was worse in supine sleep at 36/h and was 11. 6 /h in non supine sleep.   Oxygen Saturation  in Sleep    Oxygen Saturation (%) Mean:     92% and  O2 Saturation Range (%) was from 68% through 98%                                       O2 Saturation (minutes) <89%:   20 minutes        Pulse Rate in Sleep : irregular heart rate.    Pulse Mean (bpm):  89 bpm               Pulse Range:  from 74 bpm through 103 bpm                IMPRESSION:  This HST confirms the presence of severe sleep apnea, ( presumed to be obstructive apnea )with supine sleep exacerbation and  irregular heart rates, loud intermittent snoring, and hypoxia of sleep.    RECOMMENDATION: I strongly recommend to continue CPAP use, and also to continue weight loss with / without medication.  The current CPAP device is rather new and as long as its working, we wont need to order another one.  Please order ONO on CPAP to rule out persistent hypoxia- this could explain  fatigue, as can autoimmune disorders.  Core strength training may be needed to reduce apnea/ hypopnea, hypoxia.  Avoiding supine sleep will reduce apnea to the mild category.     Any Patient endorsing a high level of sleepiness should be cautioned not to drive, work at heights, or operate dangerous machinery or heavy equipment when tired or sleepy.  Review of good sleep hygiene measures took place in the initial consultation but should be revisited ( Your guide to better sleep  a publication by the NIH is a good source of information).   The referring provider will be notified of the test results.    I certify that I have reviewed the raw data recording prior to the issuance of this report in accordance with the standards of the American Academy of Sleep Medicine (AASM).    INTERPRETING PHYSICIAN:   Dedra Gores, MD  [facility] Sleep Board certified by The [employer] of Sleep Medicine and Diplomate of the [employer] of Sleep Medicine. Board certified In Neurology through the ABPN, Fellow of the [employer] of Neu[employer].

## 2024-10-17 ENCOUNTER — Ambulatory Visit: Payer: Self-pay | Attending: Physical Medicine and Rehabilitation | Admitting: Physical Therapy

## 2024-10-17 ENCOUNTER — Encounter: Payer: Self-pay | Admitting: Physical Therapy

## 2024-10-17 DIAGNOSIS — M545 Low back pain, unspecified: Secondary | ICD-10-CM | POA: Insufficient documentation

## 2024-10-17 DIAGNOSIS — M5459 Other low back pain: Secondary | ICD-10-CM | POA: Insufficient documentation

## 2024-10-17 DIAGNOSIS — M7918 Myalgia, other site: Secondary | ICD-10-CM | POA: Insufficient documentation

## 2024-10-17 DIAGNOSIS — M5416 Radiculopathy, lumbar region: Secondary | ICD-10-CM | POA: Insufficient documentation

## 2024-10-17 DIAGNOSIS — G8929 Other chronic pain: Secondary | ICD-10-CM | POA: Insufficient documentation

## 2024-10-17 DIAGNOSIS — M47816 Spondylosis without myelopathy or radiculopathy, lumbar region: Secondary | ICD-10-CM | POA: Insufficient documentation

## 2024-10-17 DIAGNOSIS — M47819 Spondylosis without myelopathy or radiculopathy, site unspecified: Secondary | ICD-10-CM | POA: Insufficient documentation
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
## 2024-10-20 ENCOUNTER — Other Ambulatory Visit: Payer: Self-pay | Admitting: Nurse Practitioner

## 2024-10-23 ENCOUNTER — Ambulatory Visit: Payer: Self-pay | Admitting: Physical Therapy

## 2024-10-23 ENCOUNTER — Encounter: Payer: Self-pay | Admitting: Nurse Practitioner

## 2024-10-23 ENCOUNTER — Other Ambulatory Visit: Payer: Self-pay

## 2024-10-23 ENCOUNTER — Encounter: Payer: Self-pay | Admitting: Physical Therapy

## 2024-10-23 VITALS — BP 137/96 | HR 86

## 2024-10-23 DIAGNOSIS — M5416 Radiculopathy, lumbar region: Secondary | ICD-10-CM

## 2024-10-23 DIAGNOSIS — I10 Essential (primary) hypertension: Secondary | ICD-10-CM

## 2024-10-23 DIAGNOSIS — M5459 Other low back pain: Secondary | ICD-10-CM

## 2024-10-23 MED ORDER — AMLODIPINE BESYLATE 2.5 MG PO TABS: 2.5000 mg | ORAL_TABLET | Freq: Every day | ORAL | 1 refills | Status: AC

## 2024-10-23 NOTE — Therapy (Addendum)
 " OUTPATIENT PHYSICAL THERAPY TREATMENT   Patient Name: Troy Olson MRN: [id number] DOB:Nov 16, 1970, 54 y.o., male Today's Date: 10/23/2024  END OF SESSION:  PT End of Session - 10/23/24 1742     Visit Number 2    Number of Visits 17    Date for Recertification  01/09/25    Authorization Type [employer] reporitng period from 10/17/2024    Progress Note Due on Visit 10    PT Start Time 1742    PT Stop Time 1824    PT Time Calculation (min) 42 min    Activity Tolerance Patient tolerated treatment well    Behavior During Therapy [facility] for tasks assessed/performed           Past Medical History:  Diagnosis Date   Abscess of axilla, left 10/12/2017   Allergies    Allergy    Arthritis    Asthma    Back pain    Eczema    Edema of both lower extremities    Food allergy    HLD (hyperlipidemia)    Hypertension    Morbid obesity (HCC)    Prediabetes    Sleep apnea    CPAP   Past Surgical History:  Procedure Laterality Date   COLONOSCOPY  01/27/2021   Normal   MOUTH SURGERY     widsom teeth   Patient Active Problem List   Diagnosis Date Noted   Dependence on CPAP ventilation 10/10/2024   Severe obstructive sleep apnea-hypopnea syndrome 10/10/2024   Other fatigue 07/26/2024   Morbid obesity with BMI of 40.0-44.9, adult (HCC) 07/26/2024   Nonspecific chest pain 07/26/2024   Other skin changes 04/10/2024   Motion sickness 04/10/2024   Food allergy 04/10/2024   Herpes zoster vaccination declined 02/22/2024   Discoloration of nailbeds 08/10/2023   Need for influenza vaccination 08/02/2023   Pain of toe of left foot 08/02/2023   Chronic midline low back pain without sciatica 08/02/2023   OSA on CPAP 08/02/2023   Abdominal hernia without obstruction and without gangrene 04/27/2023   Pain in abdomen on palpation 04/27/2023   Acute bilateral low back pain without sciatica 04/27/2023   Class 3 severe obesity due to excess calories with serious comorbidity and body  mass index (BMI) of 45.0 to 49.9 in adult (HCC) 04/27/2023   Essential hypertension 01/28/2023   Prediabetes    Asthma in adult without complication 01/24/2019   Moderate persistent asthma 03/16/2012    PCP: Georgina Speaks, FNP   REFERRING PROVIDER: Trudy Duwaine BRAVO, NP   REFERRING DIAG: chronic bilateral low back pain without sciatica, spondylosis without myelopathy or radiculopathy, lumbar facet arthropathy, myofascial pain syndrome  Rationale for Evaluation and Treatment: Rehabilitation  THERAPY DIAG:  Other low back pain  Radiculopathy, lumbar region  ONSET DATE: 2021  SUBJECTIVE:  PERTINENT HISTORY:  Patient is a 54 y.o. male who presents to outpatient physical therapy with a referral for medical diagnosis chronic bilateral low back pain without sciatica, spondylosis without myelopathy or radiculopathy, lumbar facet arthropathy, myofascial pain syndrome. This patient's chief complaints consist of chronic episodic low back pain with acute intermittent radiation down left LE leading to the following functional deficits: difficulty with sitting, lifting, traveling, prolonged positions, standing, sleeping, working, bowling, getting up in the morning, going to [employer] (HIIT workouts) daily, getting up and down from the floor (especially off his back). Relevant past medical history and comorbidities include the following: he has Moderate persistent asthma; Asthma in adult without complication; Prediabetes; Essential hypertension; Abdominal hernia without obstruction and without gangrene; Acute bilateral low back pain without sciatica; Class 3 severe obesity due to excess calories with serious comorbidity and body mass index (BMI) of 45.0 to 49.9 in adult [facility]);  Pain of toe of left foot; Chronic midline low back  pain without sciatica; OSA on CPAP; Discoloration of nailbeds; Motion sickness; Food allergy; Other fatigue; Nonspecific chest pain; Dependence on CPAP ventilation; and Severe obstructive sleep apnea-hypopnea syndrome on their problem list. he  has a past medical history of Abscess of axilla, left (10/12/2017), Allergies, Allergy, Arthritis, Asthma, Back pain, Eczema, Edema of both lower extremities, Food allergy, HLD (hyperlipidemia), Hypertension, Morbid obesity (HCC), Prediabetes, and Sleep apnea. he  has a past surgical history that includes Mouth surgery. Patient denies hx of cancer, stroke, seizures, lung problems, diabetes, unexplained weight loss, unexplained changes in bowel or bladder problems, unexplained stumbling or dropping things, osteoporosis, and spinal surgery  Exercise history: Was going to [employer] daily (did this for 1.5 years). He did not work out that intense before that. He did used to do some CrossFit in 2018 (for about 1 year). It has been about a year since he has been there. He has lost 65# and hoping that would help his back but it has not. He stopped [employer] because of his back pain (the floor exercises). Currently walking around the neighborhood a few times a week for about 30 minutes. He has DB sets from 2.5#-50# at home.   SUBJECTIVE STATEMENT: Patient states he went to the gym today and did some light things. He walking on the treadmill with a 9% incline for 30 minutes and some chest and triceps strengthening. He did not have any pain while at the gym but he felt a little tightness in his back after he got home and relaxed. He went to get up on Saturday and he had shooting pain down the left leg (faded over 20-30 minutes).   PAIN: NPRS:3/10 stiffness across low back and nothing in left leg currently.    From initial PT evaluation on 10/17/24: NPRS: Current: 1/10,  Best: 1/10, Worst: 7.5-8/10. Pain location: across low back radiating towards both iliac crests and up to the mid  thoracic spine. Gets intermittent pain down the lateral left leg.  Pain description: like someone is taking needles and poking me . Numbness/tingling: denies Aggravating factors: prolonged sitting, getting up in the morning, getting off the floor.  Relieving factors: once it gets warmed up, being still, ibuprofen (4 helped)   PRECAUTIONS: None  WEIGHT BEARING RESTRICTIONS: No  FALLS:  Has patient fallen in last 6 months? No  PATIENT GOALS: pain relief, getting back to going to [employer]  OBJECTIVE  Vitals:   10/23/24 1746  BP: (!) 137/96  Pulse: 86  SpO2: 100%  SELF-REPORTED FUNCTION THE PATIENT SPECIFIC FUNCTIONAL SCALE Place score of 0-10 (0 = unable to perform activity and 10 = able to perform activity at the same level as before injury or problem) Activity Date: 10/23/24    [facility] training 1/10    2.bowling 7/10    3. Getting up from floor 1/10    4.      Total Score 3/10    Total Score = Sum of activity scores/number of activities Minimally Detectable Change: 3 points (for single activity); 2 points (for average score) [facility] (nd). The Patient Specific Functional Scale . Retrieved from [email or web address]    SITTING Neurological Testing Neural Tension Testing SLR L: positive  R: negative   Repeated Motions Testing Pre-test Symptoms Test Movement Symptom During Symptom After Mechanical Response Key Functional Test   REIL produces tightness across low back Worse tension across low back  No change in SLR      TREATMENT                                                                                                                               Therapeutic exercise: therapeutic exercises that incorporate ONE parameter at one or more areas of the body to centralize symptoms, develop strength and endurance, range of motion, and flexibility required for successful completion of functional  activities.  Vitals measurement for system's review (see above).   SLR see above  Repeated motions testing (see above)  Neuromuscular Re-education: a technique or exercise performed with the goal of improving the level of communication between the body and the brain, such as for balance, motor control, muscle activation patterns, coordination, desensitization, quality of muscle contraction, proprioception, and/or kinesthetic sense needed for successful and safe completion of functional activities.   Hooklying pelvic tilt AROM in pain limited/free range 1x10 after finding pain limited range and learning how to perform Tactile cuing under lumbar spine  Hooklying diaphragmatic breathing with ADIM to improve coordination of abdominal brace 1x10  Improved with practice  Hooklying PPT with lat pull over, focusing on keeping abdominal muscles working, feeling for when low back arches or ribs flair and correcting it 1x10 AROM 1x10-20 with 5#DB in each hand Added to HEP  Prone (with folded pillow under abdomen) alternating hip extension with slight PPT, focused on stabilizing lumbar spine with TrA contraction and pelvic floor contraction, cuing thought of hip extension and moving into hip extension only as far as possible without pain provocation and while keeping lumbopelvic control locked in.  Reps to learn technique with step by step and multimodal cuing, then  1x10 each side with 5 second hold Added to HEP  Quadruped diaphragmatic breathing with ADIM to improve coordination of abdominal brace. Performed against gravity to help pt feel the pulling in 1x10  Improved with practice Added to HEP   Pt required multimodal cuing for proper technique and to facilitate improved neuromuscular control, strength, range of motion, and functional  ability resulting in improved performance and form.   PATIENT EDUCATION:  Education details: Education on diagnosis, prognosis, POC, anatomy and  physiology of current condition. Person educated: Patient Education method: Explanation Education comprehension: verbalized understanding and needs further education  HOME EXERCISE PROGRAM: Access Code: [email or web address] URL: [email or web address] Date: 10/23/2024 Prepared by: Camie Cleverly  Exercises - Quadruped Diaphragmatic Breathing  - 1 x daily - 2 sets - 10 breaths - Supine Shoulder Flexion with Dowel  - 1-2 x daily - 3 sets - 10 reps - Prone Hip Extension with Pillow Under Abdomen  - 1 x daily - 2-3 sets - 10 reps - 5 seconds hold  ASSESSMENT:  CLINICAL IMPRESSION: pt. came into PT reporting that he completed a gym workout earlier in the day and was slightly sore from the workout. Pt.presented with a (+) SLR on left side, increasing radicular sx down left leg. Pt completed repeated back extension exercise to centralize and reduce pain. When retesting SLR, pt. still presented with radicular sx with no benefits noted from repeated extension exercise, so this was discontinued as it increased low back pain. Pt noted to continue to have difficulty with transitional movements/bed mobility due to pain. pt. education was completed for exercise comprehension in PPT to improve lumbopelvic control and awareness to prepare for exercises to strengthen spine. Pt. completed core exercises for strengthening and controlled muscle activation, specifically targeting muscles such as the multifidi that help control anterior shear so he can bend forwards and stand up from sitting more easily without increased pain. Bridge was attempted but pt did not tolerate well due to low back pain, so less irritating prone exercise was completed instead. Patient did have some difficulty with drawing in his abdominals to provide stability at the core cannister instead of pressing out, suggesting decreased use of transverse abdominus and inefficient and ineffective core bracing strategy that could be contributing to his  pain. He was better able to pull in during diaphragmatic breathing exercises in hooklying and quadruped and will be ready to advance as soon as he masters the motor control of this low strain exercise. Pt tolerated exercises well and displayed a good attitude towards completing HEP which was provided today. He reported no increase in pain by end of session. Patient would benefit from continued management of limiting condition by skilled physical therapist to address remaining impairments and functional limitations to work towards stated goals and return to PLOF or maximal functional independence.   From initial PT evaluation on 10/17/2024: Patient is a 54 y.o. male referred to outpatient physical therapy with a medical diagnosis of chronic bilateral low back pain without sciatica, spondylosis without myelopathy or radiculopathy, lumbar facet arthropathy, myofascial pain syndrome who presents with signs and symptoms consistent with chronic intermittent low back pain with acute onset of left radicular symptoms to the foot. He had positive L slump test in flexed and extended position and difficulty with supine to sit with increased concordant pain in the low back. He had mild increased L leg pain with left lumbar sidebending and left rotation, and lacked lumbar extension AROM. He had diminished R quad and achilles DTR compared to L, and had weaker single leg heel raise on L LE compared to R. He had left lateral shift of the spine that did not appear to be relevant. Patient presents with significant pain, neural tension, ROM, motor control, knowledge, posture, muscle performance (strength/power/endurance), balance, and activity tolerance impairments that are limiting ability to complete usual activities such as sitting,  lifting, traveling, prolonged positions, standing, sleeping, working, bowling, getting up in the morning, going to [employer] (HIIT workouts) daily, getting up and down from the floor (especially off his back)  without difficulty. Patient will benefit from skilled physical therapy intervention to address current body structure impairments and activity limitations to improve function and work towards goals set in current POC in order to return to prior level of function or maximal functional improvement.   Mechanical sensitivities: neural tension   OBJECTIVE IMPAIRMENTS: decreased activity tolerance, decreased balance, decreased coordination, decreased endurance, decreased knowledge of condition, decreased ROM, decreased strength, and pain.   ACTIVITY LIMITATIONS: carrying, lifting, bending, sitting, standing, squatting, sleeping, transfers, bed mobility, and working out  PARTICIPATION LIMITATIONS: interpersonal relationship, driving, community activity, occupation, and working out  PERSONAL FACTORS: Past/current experiences, Time since onset of injury/illness/exacerbation, and 3+ comorbidities:  Moderate persistent asthma; Asthma in adult without complication; Prediabetes; Essential hypertension; Abdominal hernia without obstruction and without gangrene; Acute bilateral low back pain without sciatica; Class 3 severe obesity due to excess calories with serious comorbidity and body mass index (BMI) of 45.0 to 49.9 in adult [facility]);  Pain of toe of left foot; Chronic midline low back pain without sciatica; OSA on CPAP; Discoloration of nailbeds; Motion sickness; Food allergy; Other fatigue; Nonspecific chest pain; Dependence on CPAP ventilation; and Severe obstructive sleep apnea-hypopnea syndrome on their problem list. he  has a past medical history of Abscess of axilla, left (10/12/2017), Allergies, Allergy, Arthritis, Asthma, Back pain, Eczema, Edema of both lower extremities, Food allergy, HLD (hyperlipidemia), Hypertension, Morbid obesity (HCC), Prediabetes, and Sleep apnea. he  has a past surgical history that includes Mouth surgery are also affecting patient's functional outcome.   REHAB POTENTIAL:  Good  CLINICAL DECISION MAKING: Evolving/moderate complexity  EVALUATION COMPLEXITY: Moderate   GOALS: Goals reviewed with patient? No  SHORT TERM GOALS: Target date: 10/31/2024  Patient will be independent with initial home exercise program for self-management of symptoms. Baseline: Initial HEP to be provided at visit 2 as appropriate (10/17/2024); Goal status: In progress  LONG TERM GOALS: Target date: 01/09/2025  Patient will be independent with a long-term home exercise program for self-management of symptoms.  Baseline: Initial HEP to be provided at visit 2 as appropriate (10/17/2024); Goal status: In progress  2.  Patient will demonstrate improved Patient Specific Functional Scale (PSFS) to equal or greater than 8/10 to demonstrate improvement in overall condition and self-reported functional ability.  Baseline: to be measured at visit 2 as appropriate (10/17/2024); 3/10 (10/23/24) Goal status: In progress  3.  Patient will demonstrate the ability to perform as many burpees as possible in 1 minute without increased pain to help him return to [employer] HIIT classes.  Baseline: avoiding [employer] classes due to difficulty getting up from the floor (10/17/2024); Goal status: In progress  4.  Patient will demonstrate LE neurodynamic testing equal bilaterally and within normal limits for a healthy adult (no more than end range tension) to demonstrate good tolerance for exercise progressions and readiness to return to lifting, bending, and HIIT exercise.  Baseline: Slump test positive on L LE in flexion and extension (10/17/2024); Goal status: In progress  5.  Patient will demonstrate equal LE functional strength bilaterally in 10 consecutive single leg heel raises to demonstrate improved LE strength and myotome recovery for return to HIIT exercise.  Baseline: Severe compensations and unable to perform more than 3 heel raises on L compared to R which was able to complete 10 reps with fatigue  only at end of  set (10/17/2024); Goal status: In progress  6.  Patient will report NPRS equal or less than 3/10 during functional activities during the last 2 weeks to improve their abilitly to complete community, work and/or recreational activities with less limitation. Baseline: 8/10 (10/17/2024); Goal status: In progress   PLAN:  PT FREQUENCY: 2x/week  PT DURATION: 12 weeks  PLANNED INTERVENTIONS: 97164- PT Re-evaluation, 97750- Physical Performance Testing, 97110-Therapeutic exercises, 97530- Therapeutic activity, [id number]- Neuromuscular re-education, 97535- Self Care, [id number]- Manual therapy, [id number]- Electrical stimulation (unattended), 20560 (1-2 muscles), 20561 (3+ muscles)- Dry Needling, Patient/Family education, Balance training, Cryotherapy, and Moist heat.  PLAN FOR NEXT SESSION: continue to work on motor control of lumbopelvic region and progress to strengthening and more functional exercises as pt is able without increased pain or losing lumbopelvic control/correct bracing techniques, update HEP as appropriate, education on mechanical stressors and modifications of activities to avoid them, recover motor control and awareness of modifiers to mechanical sensitivities, retrain motor patterns, regain ROM, improve strength and resilience needed for  performing desired functional performance with appropriate ROM, strength, power, and endurance. Manual therapy as needed.  Camie SAUNDERS. Juli, PT, DPT, Cert. MDT, PRA-C 10/23/2024, 6:28 PM  [facility] [address] [place], [place] [address] P: [phone] I F: [phone]    "

## 2024-10-24 ENCOUNTER — Encounter: Payer: Self-pay | Admitting: Physical Therapy

## 2024-10-25 ENCOUNTER — Ambulatory Visit: Payer: Self-pay | Admitting: Physical Therapy

## 2024-10-25 NOTE — Telephone Encounter (Signed)
 [place] Dowd D, CMA  [place], Terri; [place], [place]; [place], [place] L New orders have been placed for the above pt, DOB: Jul 19, 2071 Thanks

## 2024-10-27 ENCOUNTER — Ambulatory Visit: Payer: Self-pay | Admitting: Cardiology

## 2024-10-31 ENCOUNTER — Encounter: Payer: Self-pay | Admitting: Physical Therapy

## 2024-10-31 ENCOUNTER — Telehealth: Payer: Self-pay | Admitting: Physical Therapy

## 2024-10-31 ENCOUNTER — Ambulatory Visit: Payer: Self-pay | Attending: Physical Medicine and Rehabilitation | Admitting: Physical Therapy

## 2024-10-31 DIAGNOSIS — M5416 Radiculopathy, lumbar region: Secondary | ICD-10-CM | POA: Insufficient documentation

## 2024-10-31 DIAGNOSIS — M5459 Other low back pain: Secondary | ICD-10-CM | POA: Diagnosis present

## 2024-10-31 NOTE — Therapy (Signed)
 " OUTPATIENT PHYSICAL THERAPY TREATMENT   Patient Name: Troy Olson MRN: [id number] DOB:[birth year], 54 y.o., male Today's Date: 10/31/2024  END OF SESSION:  PT End of Session - 10/31/24 0953     Visit Number 3    Number of Visits 17    Date for Recertification  01/09/25    Authorization Type [employer] reporitng period from 10/17/2024    Progress Note Due on Visit 10    PT Start Time 0952    PT Stop Time 1030    PT Time Calculation (min) 38 min    Activity Tolerance Patient tolerated treatment well    Behavior During Therapy [facility] for tasks assessed/performed            Past Medical History:  Diagnosis Date   Abscess of axilla, left 10/12/2017   Allergies    Allergy    Arthritis    Asthma    Back pain    Eczema    Edema of both lower extremities    Food allergy    HLD (hyperlipidemia)    Hypertension    Morbid obesity (HCC)    Prediabetes    Sleep apnea    CPAP   Past Surgical History:  Procedure Laterality Date   COLONOSCOPY  01/27/2021   Normal   MOUTH SURGERY     widsom teeth   Patient Active Problem List   Diagnosis Date Noted   Dependence on CPAP ventilation 10/10/2024   Severe obstructive sleep apnea-hypopnea syndrome 10/10/2024   Other fatigue 07/26/2024   Morbid obesity with BMI of 40.0-44.9, adult (HCC) 07/26/2024   Nonspecific chest pain 07/26/2024   Other skin changes 04/10/2024   Motion sickness 04/10/2024   Food allergy 04/10/2024   Herpes zoster vaccination declined 02/22/2024   Discoloration of nailbeds 08/10/2023   Need for influenza vaccination 08/02/2023   Pain of toe of left foot 08/02/2023   Chronic midline low back pain without sciatica 08/02/2023   OSA on CPAP 08/02/2023   Abdominal hernia without obstruction and without gangrene 04/27/2023   Pain in abdomen on palpation 04/27/2023   Acute bilateral low back pain without sciatica 04/27/2023   Class 3 severe obesity due to excess calories with serious comorbidity and  body mass index (BMI) of 45.0 to 49.9 in adult (HCC) 04/27/2023   Essential hypertension 01/28/2023   Prediabetes    Asthma in adult without complication 01/24/2019   Moderate persistent asthma 03/16/2012    PCP: Georgina Speaks, FNP   REFERRING PROVIDER: Trudy Duwaine BRAVO, NP   REFERRING DIAG: chronic bilateral low back pain without sciatica, spondylosis without myelopathy or radiculopathy, lumbar facet arthropathy, myofascial pain syndrome  Rationale for Evaluation and Treatment: Rehabilitation  THERAPY DIAG:  Other low back pain  Radiculopathy, lumbar region  ONSET DATE: 2021  SUBJECTIVE:  PERTINENT HISTORY:  Patient is a 54 y.o. male who presents to outpatient physical therapy with a referral for medical diagnosis chronic bilateral low back pain without sciatica, spondylosis without myelopathy or radiculopathy, lumbar facet arthropathy, myofascial pain syndrome. This patient's chief complaints consist of chronic episodic low back pain with acute intermittent radiation down left LE leading to the following functional deficits: difficulty with sitting, lifting, traveling, prolonged positions, standing, sleeping, working, bowling, getting up in the morning, going to [employer] (HIIT workouts) daily, getting up and down from the floor (especially off his back). Relevant past medical history and comorbidities include the following: he has Moderate persistent asthma; Asthma in adult without complication; Prediabetes; Essential hypertension; Abdominal hernia without obstruction and without gangrene; Acute bilateral low back pain without sciatica; Class 3 severe obesity due to excess calories with serious comorbidity and body mass index (BMI) of 45.0 to 49.9 in adult [facility]);  Pain of toe of left foot; Chronic midline low  back pain without sciatica; OSA on CPAP; Discoloration of nailbeds; Motion sickness; Food allergy; Other fatigue; Nonspecific chest pain; Dependence on CPAP ventilation; and Severe obstructive sleep apnea-hypopnea syndrome on their problem list. he  has a past medical history of Abscess of axilla, left (10/12/2017), Allergies, Allergy, Arthritis, Asthma, Back pain, Eczema, Edema of both lower extremities, Food allergy, HLD (hyperlipidemia), Hypertension, Morbid obesity (HCC), Prediabetes, and Sleep apnea. he  has a past surgical history that includes Mouth surgery. Patient denies hx of cancer, stroke, seizures, lung problems, diabetes, unexplained weight loss, unexplained changes in bowel or bladder problems, unexplained stumbling or dropping things, osteoporosis, and spinal surgery  Exercise history: Was going to [employer] daily (did this for 1.5 years). He did not work out that intense before that. He did used to do some CrossFit in 2018 (for about 1 year). It has been about a year since he has been there. He has lost 65# and hoping that would help his back but it has not. He stopped [employer] because of his back pain (the floor exercises). Currently walking around the neighborhood a few times a week for about 30 minutes. He has DB sets from 2.5#-50# at home.   SUBJECTIVE STATEMENT: Patient states he is feeling good today. He has not had any of the sharp pains that he was having before during the past week. He states his biggest problem is getting stiff after sitting too much. He has been home a little more this week. He went to the gym 3 times last week and he has been doing his HEP daily. He has not been having any leg symptoms.   Gym: light weights (chest/tri/upper back), incline treadmill.   PAIN: NPRS: 0/10  From initial PT evaluation on 10/17/24: NPRS: Current: 1/10,  Best: 1/10, Worst: 7.5-8/10. Pain location: across low back radiating towards both iliac crests and up to the mid thoracic spine. Gets  intermittent pain down the lateral left leg.  Pain description: like someone is taking needles and poking me . Numbness/tingling: denies Aggravating factors: prolonged sitting, getting up in the morning, getting off the floor.  Relieving factors: once it gets warmed up, being still, ibuprofen (4 helped)   PRECAUTIONS: None  WEIGHT BEARING RESTRICTIONS: No  FALLS:  Has patient fallen in last 6 months? No  PATIENT GOALS: pain relief, getting back to going to [employer]  OBJECTIVE   SITTING Neurological Testing Neural Tension Testing SLR L: tension at 45 degrees R: tension at 45 degrees  TREATMENT  Neuromuscular Re-education: a technique or exercise performed with the goal of improving the level of communication between the body and the brain, such as for balance, motor control, muscle activation patterns, coordination, desensitization, quality of muscle contraction, proprioception, and/or kinesthetic sense needed for successful and safe completion of functional activities.   SLR to assess tolerance  Quadruped diaphragmatic breathing with ADIM to improve coordination of abdominal brace. Performed against gravity to help pt feel the pulling in Paradoxical breathing Provided handout with visual of correct pattern and practiced more 1x10  Provided handout to take home  Hooklying PPT with lat pull over, focusing on keeping abdominal muscles working, feeling for when low back arches or ribs flair and correcting it 1x20 with 5#DB in each hand 1x18 with 10#DB in each hand Updated HEP   Prone (with folded pillow under abdomen) alternating hip extension with slight PPT, focused on stabilizing lumbar spine with TrA contraction and pelvic floor contraction, cuing thought of hip extension and moving into hip extension only as far as possible without pain provocation and  while keeping lumbopelvic control locked in.  1x10 each side with 5 second hold Good carry over with slight adjustments from PT to refine form and pillow placement  Quadruped multifidi hip hikes with one knee on folded towel 2x5 each side with 5 second hold Cuing for TrA and pelvic floor contraction during motion   Quadruped Alternating hip extension with neutral to slight PPT, stepwise with knee lift toe slide to knee extension before/after lift off. No shifting.  1x5 each side with 5 second hold Cuing for TrA and pelvic floor contraction during motion  Difficulty with control and had quick fatigue  Pt required multimodal cuing for proper technique and to facilitate improved neuromuscular control, strength, range of motion, and functional ability resulting in improved performance and form.   PATIENT EDUCATION:  Education details: Education on diagnosis, prognosis, POC, anatomy and physiology of current condition. Person educated: Patient Education method: Explanation Education comprehension: verbalized understanding and needs further education  HOME EXERCISE PROGRAM: Access Code: [email or web address] URL: [email or web address] Date: 10/31/2024 Prepared by: Camie Cleverly  Exercises - Quadruped Diaphragmatic Breathing  - 1 x daily - 2 sets - 10 breaths - Supine Shoulder Flexion with Dowel  - 1-2 x daily - 3 sets - 10 reps - Quadruped Hip Hike on Foam  - 1 x daily - 3 sets - 5 reps - 5 seconds hold  ASSESSMENT:  CLINICAL IMPRESSION: Patient able to progress exercises for lumbar stability. He had trouble maintaining form and fatigued quickly with quadruped hip extension and hip hikes were most appropriate progression. This was added to HEP. Patient would benefit from continued management of limiting condition by skilled physical therapist to address remaining impairments and functional limitations to work towards stated goals and return to PLOF or maximal functional independence.     From initial PT evaluation on 10/17/2024: Patient is a 54 y.o. male referred to outpatient physical therapy with a medical diagnosis of chronic bilateral low back pain without sciatica, spondylosis without myelopathy or radiculopathy, lumbar facet arthropathy, myofascial pain syndrome who presents with signs and symptoms consistent with chronic intermittent low back pain with acute onset of left radicular symptoms to the foot. He had positive L slump test in flexed and extended position and difficulty with supine to sit with increased concordant pain in the low back. He had mild increased L leg pain with left lumbar sidebending and left rotation, and lacked lumbar extension AROM. He had diminished  R quad and achilles DTR compared to L, and had weaker single leg heel raise on L LE compared to R. He had left lateral shift of the spine that did not appear to be relevant. Patient presents with significant pain, neural tension, ROM, motor control, knowledge, posture, muscle performance (strength/power/endurance), balance, and activity tolerance impairments that are limiting ability to complete usual activities such as sitting, lifting, traveling, prolonged positions, standing, sleeping, working, bowling, getting up in the morning, going to [employer] (HIIT workouts) daily, getting up and down from the floor (especially off his back) without difficulty. Patient will benefit from skilled physical therapy intervention to address current body structure impairments and activity limitations to improve function and work towards goals set in current POC in order to return to prior level of function or maximal functional improvement.   Mechanical sensitivities: neural tension   OBJECTIVE IMPAIRMENTS: decreased activity tolerance, decreased balance, decreased coordination, decreased endurance, decreased knowledge of condition, decreased ROM, decreased strength, and pain.   ACTIVITY LIMITATIONS: carrying, lifting, bending,  sitting, standing, squatting, sleeping, transfers, bed mobility, and working out  PARTICIPATION LIMITATIONS: interpersonal relationship, driving, community activity, occupation, and working out  PERSONAL FACTORS: Past/current experiences, Time since onset of injury/illness/exacerbation, and 3+ comorbidities:  Moderate persistent asthma; Asthma in adult without complication; Prediabetes; Essential hypertension; Abdominal hernia without obstruction and without gangrene; Acute bilateral low back pain without sciatica; Class 3 severe obesity due to excess calories with serious comorbidity and body mass index (BMI) of 45.0 to 49.9 in adult [facility]);  Pain of toe of left foot; Chronic midline low back pain without sciatica; OSA on CPAP; Discoloration of nailbeds; Motion sickness; Food allergy; Other fatigue; Nonspecific chest pain; Dependence on CPAP ventilation; and Severe obstructive sleep apnea-hypopnea syndrome on their problem list. he  has a past medical history of Abscess of axilla, left (10/12/2017), Allergies, Allergy, Arthritis, Asthma, Back pain, Eczema, Edema of both lower extremities, Food allergy, HLD (hyperlipidemia), Hypertension, Morbid obesity (HCC), Prediabetes, and Sleep apnea. he  has a past surgical history that includes Mouth surgery are also affecting patient's functional outcome.   REHAB POTENTIAL: Good  CLINICAL DECISION MAKING: Evolving/moderate complexity  EVALUATION COMPLEXITY: Moderate   GOALS: Goals reviewed with patient? No  SHORT TERM GOALS: Target date: 10/31/2024  Patient will be independent with initial home exercise program for self-management of symptoms. Baseline: Initial HEP to be provided at visit 2 as appropriate (10/17/2024); Goal status: In progress  LONG TERM GOALS: Target date: 01/09/2025  Patient will be independent with a long-term home exercise program for self-management of symptoms.  Baseline: Initial HEP to be provided at visit 2 as appropriate  (10/17/2024); Goal status: In progress  2.  Patient will demonstrate improved Patient Specific Functional Scale (PSFS) to equal or greater than 8/10 to demonstrate improvement in overall condition and self-reported functional ability.  Baseline: to be measured at visit 2 as appropriate (10/17/2024); 3/10 (10/23/24) Goal status: In progress  3.  Patient will demonstrate the ability to perform as many burpees as possible in 1 minute without increased pain to help him return to [employer] HIIT classes.  Baseline: avoiding [employer] classes due to difficulty getting up from the floor (10/17/2024); Goal status: In progress  4.  Patient will demonstrate LE neurodynamic testing equal bilaterally and within normal limits for a healthy adult (no more than end range tension) to demonstrate good tolerance for exercise progressions and readiness to return to lifting, bending, and HIIT exercise.  Baseline: Slump test positive on L LE  in flexion and extension (10/17/2024); Goal status: In progress  5.  Patient will demonstrate equal LE functional strength bilaterally in 10 consecutive single leg heel raises to demonstrate improved LE strength and myotome recovery for return to HIIT exercise.  Baseline: Severe compensations and unable to perform more than 3 heel raises on L compared to R which was able to complete 10 reps with fatigue only at end of set (10/17/2024); Goal status: In progress  6.  Patient will report NPRS equal or less than 3/10 during functional activities during the last 2 weeks to improve their abilitly to complete community, work and/or recreational activities with less limitation. Baseline: 8/10 (10/17/2024); Goal status: In progress   PLAN:  PT FREQUENCY: 2x/week  PT DURATION: 12 weeks  PLANNED INTERVENTIONS: 97164- PT Re-evaluation, 97750- Physical Performance Testing, 97110-Therapeutic exercises, 97530- Therapeutic activity, [id number]- Neuromuscular re-education, 97535- Self Care, 02859- Manual therapy,  [id number]- Electrical stimulation (unattended), 20560 (1-2 muscles), 20561 (3+ muscles)- Dry Needling, Patient/Family education, Balance training, Cryotherapy, and Moist heat.  PLAN FOR NEXT SESSION: continue to work on motor control of lumbopelvic region and progress to strengthening and more functional exercises as pt is able without increased pain or losing lumbopelvic control/correct bracing techniques, update HEP as appropriate, education on mechanical stressors and modifications of activities to avoid them, recover motor control and awareness of modifiers to mechanical sensitivities, retrain motor patterns, regain ROM, improve strength and resilience needed for  performing desired functional performance with appropriate ROM, strength, power, and endurance. Manual therapy as needed.  Camie SAUNDERS. Juli, PT, DPT, Cert. MDT, PRA-C 10/31/24, 12:13 PM  [facility] [address] [place], [place] [address] P: [phone] I F: [phone]    "

## 2024-10-31 NOTE — Telephone Encounter (Signed)
 Spoke with patient  notifying patient of missed PT visit scheduled at 8:15 today.  He was apologetic and said he forgot and requested to reschedule. He was able to reschedule for 9:45am today.   Camie SAUNDERS. Juli, PT, DPT, Cert. MDT, PRA-C 10/31/24, 8:42 AM  [facility] [address] [place], [place] [address] P: [phone] I F: [phone]

## 2024-11-01 ENCOUNTER — Ambulatory Visit: Attending: Medical | Admitting: Medical

## 2024-11-01 ENCOUNTER — Encounter: Payer: Self-pay | Admitting: Medical

## 2024-11-01 VITALS — BP 134/92 | HR 88 | Ht 68.0 in | Wt 272.8 lb

## 2024-11-01 DIAGNOSIS — Z6841 Body Mass Index (BMI) 40.0 and over, adult: Secondary | ICD-10-CM | POA: Diagnosis not present

## 2024-11-01 DIAGNOSIS — I10 Essential (primary) hypertension: Secondary | ICD-10-CM

## 2024-11-01 DIAGNOSIS — E782 Mixed hyperlipidemia: Secondary | ICD-10-CM

## 2024-11-01 DIAGNOSIS — R072 Precordial pain: Secondary | ICD-10-CM

## 2024-11-01 NOTE — Patient Instructions (Signed)
 Medication Instructions:   Your physician recommends that you continue on your current medications as directed. Please refer to the Current Medication list given to you today.    *If you need a refill on your cardiac medications before your next appointment, please call your pharmacy*  Lab Work:  None ordered at this time   If you have labs (blood work) drawn today and your tests are completely normal, you will receive your results only by:  MyChart Message (if you have MyChart) OR  A paper copy in the mail If you have any lab test that is abnormal or we need to change your treatment, we will call you to review the results.  Testing/Procedures:  None ordered at this time   Referrals:  None ordered at this time   Follow-Up:  At [facility], you and your health needs are our priority.  As part of our continuing mission to provide you with exceptional heart care, our providers are all part of one team.  This team includes your primary Cardiologist (physician) and Advanced Practice Providers or APPs (Physician Assistants and Nurse Practitioners) who all work together to provide you with the care you need, when you need it.  Your next appointment:   1 year(s)  Provider:    You may see one of the following Advanced Practice Providers on your designated Care Team:   Lonni Meager, NP Lesley Maffucci, PA-C Bernardino Bring, PA-C Cadence [place], PA-C Tylene Lunch, NP Barnie Hila, NP    We recommend signing up for the patient portal called MyChart.  Sign up information is provided on this After Visit Summary.  MyChart is used to connect with patients for Virtual Visits (Telemedicine).  Patients are able to view lab/test results, encounter notes, upcoming appointments, etc.  Non-urgent messages can be sent to your provider as well.   To learn more about what you can do with MyChart, go to [email or web address].

## 2024-11-01 NOTE — Progress Notes (Signed)
" °  Cardiology Office Note   Date:  11/01/2024  ID:  Troy Olson, DOB 07-01-1971, MRN [id number] PCP: Georgina Speaks, FNP  [facility] Providers Cardiologist:  None   History of Present Illness Troy Olson is a 54 y.o. male with a h/o HTN, HLD, former smoker who presents for chest pain.   The patient was seen 07/28/24 by Dr. Darliss for ER follow-up for chest pain. Work up in the ER was unrevealing.  Cardiac CTA showed a coronary calcium of 0. Echo showed LVEF 60-65%, no WMA, mild MR.   Today, the patient reports he is overall feeling good. He feels he may had anxiety that was causing his symptoms. He denies chest pain, SOB, lower leg edema, orthopnea, pnd, lightheadedness, dizziness. He recently started going back to the gym. He started taking zepbound. He wants to lose 40 more pounds.  Studies Reviewed      Echo 09/2024 1. Left ventricular ejection fraction, by estimation, is 60 to 65%. The  left ventricle has normal function. The left ventricle has no regional  wall motion abnormalities. Left ventricular diastolic parameters were  normal. The average left ventricular  global longitudinal strain is -17.4 %. The global longitudinal strain is  normal.   2. Right ventricular systolic function is normal. The right ventricular  size is normal. Tricuspid regurgitation signal is inadequate for assessing  PA pressure.   3. The mitral valve is normal in structure. Mild mitral valve  regurgitation. No evidence of mitral stenosis.   4. The aortic valve is tricuspid. Aortic valve regurgitation is not  visualized. No aortic stenosis is present.   5. The inferior vena cava is normal in size with greater than 50%  respiratory variability, suggesting right atrial pressure of 3 mmHg.   Cardiac CTA 08/2024  IMPRESSION: 1. Coronary calcium score of 0.   2. Normal coronary origin with right dominance.   3. No evidence of CAD.   4. CAD-RADS 0. Consider non-atherosclerotic causes of  chest pain.     Physical Exam VS:  BP (!) 134/92 (BP Location: Left Arm, Patient Position: Sitting, Cuff Size: Large)   Pulse 88   Ht 5' 8 (1.727 m)   Wt 272 lb 12.8 oz (123.7 kg)   SpO2 99%   BMI 41.48 kg/m        Wt Readings from Last 3 Encounters:  11/01/24 272 lb 12.8 oz (123.7 kg)  08/09/24 269 lb 6.4 oz (122.2 kg)  07/28/24 267 lb 12.8 oz (121.5 kg)    GEN: Well nourished, well developed in no acute distress NECK: No JVD; No carotid bruits CARDIAC: RRR, no murmurs, rubs, gallops RESPIRATORY:  Clear to auscultation without rales, wheezing or rhonchi  ABDOMEN: Soft, non-tender, non-distended EXTREMITIES:  No edema; No deformity   ASSESSMENT AND PLAN  Chest pain Echo showed normal pump function and normal diastolic function. Cardiac CTA showed coronary calcium score of 0. He denies further chest pain. He felt chest pain was likely from anxiety. No further work-up at this time.   HTN BP is good. He is trying to male lifestyle changes for weight loss. Continue amlodipine  2.5mg  daily and hydrochlorothiazide  12.5mg  daily.   HLD LDL 121. Continue Pravastatin  40mg  daily.   Obesity He reports weight loss of 50lbs and wants to lost another 40lbs. He joined a gym and is taking Zepbound.     Dispo: Follow-up in    Signed, [name], PA-C   "

## 2024-11-02 ENCOUNTER — Ambulatory Visit: Payer: Self-pay | Admitting: Physical Therapy

## 2024-11-02 NOTE — Therapy (Unsigned)
 " OUTPATIENT PHYSICAL THERAPY TREATMENT   Patient Name: Troy Olson MRN: [id number] DOB:1971/04/08, 54 y.o., male Today's Date: 11/02/2024  END OF SESSION:      Past Medical History:  Diagnosis Date   Abscess of axilla, left 10/12/2017   Allergies    Allergy    Arthritis    Asthma    Back pain    Eczema    Edema of both lower extremities    Food allergy    HLD (hyperlipidemia)    Hypertension    Morbid obesity (HCC)    Prediabetes    Sleep apnea    CPAP   Past Surgical History:  Procedure Laterality Date   COLONOSCOPY  01/27/2021   Normal   MOUTH SURGERY     widsom teeth   Patient Active Problem List   Diagnosis Date Noted   Dependence on CPAP ventilation 10/10/2024   Severe obstructive sleep apnea-hypopnea syndrome 10/10/2024   Other fatigue 07/26/2024   Morbid obesity with BMI of 40.0-44.9, adult (HCC) 07/26/2024   Nonspecific chest pain 07/26/2024   Other skin changes 04/10/2024   Motion sickness 04/10/2024   Food allergy 04/10/2024   Herpes zoster vaccination declined 02/22/2024   Discoloration of nailbeds 08/10/2023   Need for influenza vaccination 08/02/2023   Pain of toe of left foot 08/02/2023   Chronic midline low back pain without sciatica 08/02/2023   OSA on CPAP 08/02/2023   Abdominal hernia without obstruction and without gangrene 04/27/2023   Pain in abdomen on palpation 04/27/2023   Acute bilateral low back pain without sciatica 04/27/2023   Class 3 severe obesity due to excess calories with serious comorbidity and body mass index (BMI) of 45.0 to 49.9 in adult (HCC) 04/27/2023   Essential hypertension 01/28/2023   Prediabetes    Asthma in adult without complication 01/24/2019   Moderate persistent asthma 03/16/2012    PCP: Georgina Speaks, FNP   REFERRING PROVIDER: Trudy Duwaine BRAVO, NP   REFERRING DIAG: chronic bilateral low back pain without sciatica, spondylosis without myelopathy or radiculopathy, lumbar facet arthropathy, myofascial  pain syndrome  Rationale for Evaluation and Treatment: Rehabilitation  THERAPY DIAG:  No diagnosis found.  ONSET DATE: 2021  SUBJECTIVE:                                                                                                                                                                                           PERTINENT HISTORY:  Patient is a 54 y.o. male who presents to outpatient physical therapy with a referral for medical diagnosis chronic bilateral low back pain without sciatica, spondylosis without myelopathy or radiculopathy, lumbar facet arthropathy,  myofascial pain syndrome. This patient's chief complaints consist of chronic episodic low back pain with acute intermittent radiation down left LE leading to the following functional deficits: difficulty with sitting, lifting, traveling, prolonged positions, standing, sleeping, working, bowling, getting up in the morning, going to [employer] (HIIT workouts) daily, getting up and down from the floor (especially off his back). Relevant past medical history and comorbidities include the following: he has Moderate persistent asthma; Asthma in adult without complication; Prediabetes; Essential hypertension; Abdominal hernia without obstruction and without gangrene; Acute bilateral low back pain without sciatica; Class 3 severe obesity due to excess calories with serious comorbidity and body mass index (BMI) of 45.0 to 49.9 in adult [facility]);  Pain of toe of left foot; Chronic midline low back pain without sciatica; OSA on CPAP; Discoloration of nailbeds; Motion sickness; Food allergy; Other fatigue; Nonspecific chest pain; Dependence on CPAP ventilation; and Severe obstructive sleep apnea-hypopnea syndrome on their problem list. he  has a past medical history of Abscess of axilla, left (10/12/2017), Allergies, Allergy, Arthritis, Asthma, Back pain, Eczema, Edema of both lower extremities, Food allergy, HLD (hyperlipidemia), Hypertension, Morbid obesity  (HCC), Prediabetes, and Sleep apnea. he  has a past surgical history that includes Mouth surgery. Patient denies hx of cancer, stroke, seizures, lung problems, diabetes, unexplained weight loss, unexplained changes in bowel or bladder problems, unexplained stumbling or dropping things, osteoporosis, and spinal surgery  Exercise history: Was going to [employer] daily (did this for 1.5 years). He did not work out that intense before that. He did used to do some CrossFit in 2018 (for about 1 year). It has been about a year since he has been there. He has lost 65# and hoping that would help his back but it has not. He stopped [employer] because of his back pain (the floor exercises). Currently walking around the neighborhood a few times a week for about 30 minutes. He has DB sets from 2.5#-50# at home.   SUBJECTIVE STATEMENT: Patient states he is feeling good today. He has not had any of the sharp pains that he was having before during the past week. He states his biggest problem is getting stiff after sitting too much. He has been home a little more this week. He went to the gym 3 times last week and he has been doing his HEP daily. He has not been having any leg symptoms.   Gym: light weights (chest/tri/upper back), incline treadmill.   PAIN: NPRS: 0/10  From initial PT evaluation on 10/17/24: NPRS: Current: 1/10,  Best: 1/10, Worst: 7.5-8/10. Pain location: across low back radiating towards both iliac crests and up to the mid thoracic spine. Gets intermittent pain down the lateral left leg.  Pain description: like someone is taking needles and poking me . Numbness/tingling: denies Aggravating factors: prolonged sitting, getting up in the morning, getting off the floor.  Relieving factors: once it gets warmed up, being still, ibuprofen (4 helped)   PRECAUTIONS: None  WEIGHT BEARING RESTRICTIONS: No  FALLS:  Has patient fallen in last 6 months? No  PATIENT GOALS: pain relief, getting back to going to  [employer]  OBJECTIVE   SITTING Neurological Testing Neural Tension Testing SLR L: tension at 45 degrees R: tension at 45 degrees  TREATMENT  Neuromuscular Re-education: a technique or exercise performed with the goal of improving the level of communication between the body and the brain, such as for balance, motor control, muscle activation patterns, coordination, desensitization, quality of muscle contraction, proprioception, and/or kinesthetic sense needed for successful and safe completion of functional activities.   SLR to assess tolerance  Quadruped diaphragmatic breathing with ADIM to improve coordination of abdominal brace. Performed against gravity to help pt feel the pulling in Paradoxical breathing Provided handout with visual of correct pattern and practiced more 1x10  Provided handout to take home  Hooklying PPT with lat pull over, focusing on keeping abdominal muscles working, feeling for when low back arches or ribs flair and correcting it 1x20 with 5#DB in each hand 1x18 with 10#DB in each hand Updated HEP   Prone (with folded pillow under abdomen) alternating hip extension with slight PPT, focused on stabilizing lumbar spine with TrA contraction and pelvic floor contraction, cuing thought of hip extension and moving into hip extension only as far as possible without pain provocation and while keeping lumbopelvic control locked in.  1x10 each side with 5 second hold Good carry over with slight adjustments from PT to refine form and pillow placement  Quadruped multifidi hip hikes with one knee on folded towel 2x5 each side with 5 second hold Cuing for TrA and pelvic floor contraction during motion   Quadruped Alternating hip extension with neutral to slight PPT, stepwise with knee lift toe slide to knee extension before/after lift off. No  shifting.  1x5 each side with 5 second hold Cuing for TrA and pelvic floor contraction during motion  Difficulty with control and had quick fatigue  Pt required multimodal cuing for proper technique and to facilitate improved neuromuscular control, strength, range of motion, and functional ability resulting in improved performance and form.   PATIENT EDUCATION:  Education details: Education on diagnosis, prognosis, POC, anatomy and physiology of current condition. Person educated: Patient Education method: Explanation Education comprehension: verbalized understanding and needs further education  HOME EXERCISE PROGRAM: Access Code: [email or web address] URL: [email or web address] Date: 10/31/2024 Prepared by: Camie Cleverly  Exercises - Quadruped Diaphragmatic Breathing  - 1 x daily - 2 sets - 10 breaths - Supine Shoulder Flexion with Dowel  - 1-2 x daily - 3 sets - 10 reps - Quadruped Hip Hike on Foam  - 1 x daily - 3 sets - 5 reps - 5 seconds hold  ASSESSMENT:  CLINICAL IMPRESSION: Patient able to progress exercises for lumbar stability. He had trouble maintaining form and fatigued quickly with quadruped hip extension and hip hikes were most appropriate progression. This was added to HEP. Patient would benefit from continued management of limiting condition by skilled physical therapist to address remaining impairments and functional limitations to work towards stated goals and return to PLOF or maximal functional independence.    From initial PT evaluation on 10/17/2024: Patient is a 54 y.o. male referred to outpatient physical therapy with a medical diagnosis of chronic bilateral low back pain without sciatica, spondylosis without myelopathy or radiculopathy, lumbar facet arthropathy, myofascial pain syndrome who presents with signs and symptoms consistent with chronic intermittent low back pain with acute onset of left radicular symptoms to the foot. He had positive L slump test in  flexed and extended position and difficulty with supine to sit with increased concordant pain in the low back. He had mild increased L leg pain with left lumbar sidebending and left rotation, and lacked lumbar extension AROM. He had diminished  R quad and achilles DTR compared to L, and had weaker single leg heel raise on L LE compared to R. He had left lateral shift of the spine that did not appear to be relevant. Patient presents with significant pain, neural tension, ROM, motor control, knowledge, posture, muscle performance (strength/power/endurance), balance, and activity tolerance impairments that are limiting ability to complete usual activities such as sitting, lifting, traveling, prolonged positions, standing, sleeping, working, bowling, getting up in the morning, going to [employer] (HIIT workouts) daily, getting up and down from the floor (especially off his back) without difficulty. Patient will benefit from skilled physical therapy intervention to address current body structure impairments and activity limitations to improve function and work towards goals set in current POC in order to return to prior level of function or maximal functional improvement.   Mechanical sensitivities: neural tension   OBJECTIVE IMPAIRMENTS: decreased activity tolerance, decreased balance, decreased coordination, decreased endurance, decreased knowledge of condition, decreased ROM, decreased strength, and pain.   ACTIVITY LIMITATIONS: carrying, lifting, bending, sitting, standing, squatting, sleeping, transfers, bed mobility, and working out  PARTICIPATION LIMITATIONS: interpersonal relationship, driving, community activity, occupation, and working out  PERSONAL FACTORS: Past/current experiences, Time since onset of injury/illness/exacerbation, and 3+ comorbidities:  Moderate persistent asthma; Asthma in adult without complication; Prediabetes; Essential hypertension; Abdominal hernia without obstruction and without  gangrene; Acute bilateral low back pain without sciatica; Class 3 severe obesity due to excess calories with serious comorbidity and body mass index (BMI) of 45.0 to 49.9 in adult [facility]);  Pain of toe of left foot; Chronic midline low back pain without sciatica; OSA on CPAP; Discoloration of nailbeds; Motion sickness; Food allergy; Other fatigue; Nonspecific chest pain; Dependence on CPAP ventilation; and Severe obstructive sleep apnea-hypopnea syndrome on their problem list. he  has a past medical history of Abscess of axilla, left (10/12/2017), Allergies, Allergy, Arthritis, Asthma, Back pain, Eczema, Edema of both lower extremities, Food allergy, HLD (hyperlipidemia), Hypertension, Morbid obesity (HCC), Prediabetes, and Sleep apnea. he  has a past surgical history that includes Mouth surgery are also affecting patient's functional outcome.   REHAB POTENTIAL: Good  CLINICAL DECISION MAKING: Evolving/moderate complexity  EVALUATION COMPLEXITY: Moderate   GOALS: Goals reviewed with patient? No  SHORT TERM GOALS: Target date: 10/31/2024  Patient will be independent with initial home exercise program for self-management of symptoms. Baseline: Initial HEP to be provided at visit 2 as appropriate (10/17/2024); Goal status: In progress  LONG TERM GOALS: Target date: 01/09/2025  Patient will be independent with a long-term home exercise program for self-management of symptoms.  Baseline: Initial HEP to be provided at visit 2 as appropriate (10/17/2024); Goal status: In progress  2.  Patient will demonstrate improved Patient Specific Functional Scale (PSFS) to equal or greater than 8/10 to demonstrate improvement in overall condition and self-reported functional ability.  Baseline: to be measured at visit 2 as appropriate (10/17/2024); 3/10 (10/23/24) Goal status: In progress  3.  Patient will demonstrate the ability to perform as many burpees as possible in 1 minute without increased pain to help him  return to [employer] HIIT classes.  Baseline: avoiding [employer] classes due to difficulty getting up from the floor (10/17/2024); Goal status: In progress  4.  Patient will demonstrate LE neurodynamic testing equal bilaterally and within normal limits for a healthy adult (no more than end range tension) to demonstrate good tolerance for exercise progressions and readiness to return to lifting, bending, and HIIT exercise.  Baseline: Slump test positive on L LE  in flexion and extension (10/17/2024); Goal status: In progress  5.  Patient will demonstrate equal LE functional strength bilaterally in 10 consecutive single leg heel raises to demonstrate improved LE strength and myotome recovery for return to HIIT exercise.  Baseline: Severe compensations and unable to perform more than 3 heel raises on L compared to R which was able to complete 10 reps with fatigue only at end of set (10/17/2024); Goal status: In progress  6.  Patient will report NPRS equal or less than 3/10 during functional activities during the last 2 weeks to improve their abilitly to complete community, work and/or recreational activities with less limitation. Baseline: 8/10 (10/17/2024); Goal status: In progress   PLAN:  PT FREQUENCY: 2x/week  PT DURATION: 12 weeks  PLANNED INTERVENTIONS: 97164- PT Re-evaluation, 97750- Physical Performance Testing, 97110-Therapeutic exercises, 97530- Therapeutic activity, [id number]- Neuromuscular re-education, 97535- Self Care, [id number]- Manual therapy, [id number]- Electrical stimulation (unattended), 20560 (1-2 muscles), 20561 (3+ muscles)- Dry Needling, Patient/Family education, Balance training, Cryotherapy, and Moist heat.  PLAN FOR NEXT SESSION: continue to work on motor control of lumbopelvic region and progress to strengthening and more functional exercises as pt is able without increased pain or losing lumbopelvic control/correct bracing techniques, update HEP as appropriate, education on mechanical stressors and  modifications of activities to avoid them, recover motor control and awareness of modifiers to mechanical sensitivities, retrain motor patterns, regain ROM, improve strength and resilience needed for  performing desired functional performance with appropriate ROM, strength, power, and endurance. Manual therapy as needed.  Camie SAUNDERS. Juli, PT, DPT, Cert. MDT, PRA-C 11/02/24, 8:07 AM  [facility] [address] [place], [place] [address] P: [phone] I F: [phone]    "

## 2024-11-07 ENCOUNTER — Encounter: Payer: Self-pay | Admitting: Physical Therapy

## 2024-11-07 ENCOUNTER — Ambulatory Visit: Payer: Self-pay | Admitting: Physical Therapy

## 2024-11-07 DIAGNOSIS — M5459 Other low back pain: Secondary | ICD-10-CM | POA: Diagnosis not present

## 2024-11-07 DIAGNOSIS — M5416 Radiculopathy, lumbar region: Secondary | ICD-10-CM

## 2024-11-07 NOTE — Therapy (Signed)
 " OUTPATIENT PHYSICAL THERAPY TREATMENT   Patient Name: Troy Olson MRN: [id number] DOB:1970/12/20, 54 y.o., male Today's Date: 11/07/2024  END OF SESSION:  PT End of Session - 11/07/24 0954     Visit Number 4    Number of Visits 17    Date for Recertification  01/09/25    Authorization Type [employer] reporitng period from 10/17/2024    Progress Note Due on Visit 10    PT Start Time 0953    PT Stop Time 1032    PT Time Calculation (min) 39 min    Activity Tolerance Patient tolerated treatment well    Behavior During Therapy [facility] for tasks assessed/performed             Past Medical History:  Diagnosis Date   Abscess of axilla, left 10/12/2017   Allergies    Allergy    Arthritis    Asthma    Back pain    Eczema    Edema of both lower extremities    Food allergy    HLD (hyperlipidemia)    Hypertension    Morbid obesity (HCC)    Prediabetes    Sleep apnea    CPAP   Past Surgical History:  Procedure Laterality Date   COLONOSCOPY  01/27/2021   Normal   MOUTH SURGERY     widsom teeth   Patient Active Problem List   Diagnosis Date Noted   Dependence on CPAP ventilation 10/10/2024   Severe obstructive sleep apnea-hypopnea syndrome 10/10/2024   Other fatigue 07/26/2024   Morbid obesity with BMI of 40.0-44.9, adult (HCC) 07/26/2024   Nonspecific chest pain 07/26/2024   Other skin changes 04/10/2024   Motion sickness 04/10/2024   Food allergy 04/10/2024   Herpes zoster vaccination declined 02/22/2024   Discoloration of nailbeds 08/10/2023   Need for influenza vaccination 08/02/2023   Pain of toe of left foot 08/02/2023   Chronic midline low back pain without sciatica 08/02/2023   OSA on CPAP 08/02/2023   Abdominal hernia without obstruction and without gangrene 04/27/2023   Pain in abdomen on palpation 04/27/2023   Acute bilateral low back pain without sciatica 04/27/2023   Class 3 severe obesity due to excess calories with serious comorbidity and  body mass index (BMI) of 45.0 to 49.9 in adult (HCC) 04/27/2023   Essential hypertension 01/28/2023   Prediabetes    Asthma in adult without complication 01/24/2019   Moderate persistent asthma 03/16/2012    PCP: Georgina Speaks, FNP   REFERRING PROVIDER: Trudy Duwaine BRAVO, NP   REFERRING DIAG: chronic bilateral low back pain without sciatica, spondylosis without myelopathy or radiculopathy, lumbar facet arthropathy, myofascial pain syndrome  Rationale for Evaluation and Treatment: Rehabilitation  THERAPY DIAG:  Other low back pain  Radiculopathy, lumbar region  ONSET DATE: 2021  SUBJECTIVE:  PERTINENT HISTORY:  Patient is a 54 y.o. male who presents to outpatient physical therapy with a referral for medical diagnosis chronic bilateral low back pain without sciatica, spondylosis without myelopathy or radiculopathy, lumbar facet arthropathy, myofascial pain syndrome. This patient's chief complaints consist of chronic episodic low back pain with acute intermittent radiation down left LE leading to the following functional deficits: difficulty with sitting, lifting, traveling, prolonged positions, standing, sleeping, working, bowling, getting up in the morning, going to [employer] (HIIT workouts) daily, getting up and down from the floor (especially off his back). Relevant past medical history and comorbidities include the following: he has Moderate persistent asthma; Asthma in adult without complication; Prediabetes; Essential hypertension; Abdominal hernia without obstruction and without gangrene; Acute bilateral low back pain without sciatica; Class 3 severe obesity due to excess calories with serious comorbidity and body mass index (BMI) of 45.0 to 49.9 in adult [facility]);  Pain of toe of left foot; Chronic midline low  back pain without sciatica; OSA on CPAP; Discoloration of nailbeds; Motion sickness; Food allergy; Other fatigue; Nonspecific chest pain; Dependence on CPAP ventilation; and Severe obstructive sleep apnea-hypopnea syndrome on their problem list. he  has a past medical history of Abscess of axilla, left (10/12/2017), Allergies, Allergy, Arthritis, Asthma, Back pain, Eczema, Edema of both lower extremities, Food allergy, HLD (hyperlipidemia), Hypertension, Morbid obesity (HCC), Prediabetes, and Sleep apnea. he  has a past surgical history that includes Mouth surgery. Patient denies hx of cancer, stroke, seizures, lung problems, diabetes, unexplained weight loss, unexplained changes in bowel or bladder problems, unexplained stumbling or dropping things, osteoporosis, and spinal surgery  Exercise history: Was going to [employer] daily (did this for 1.5 years). He did not work out that intense before that. He did used to do some CrossFit in 2018 (for about 1 year). It has been about a year since he has been there. He has lost 65# and hoping that would help his back but it has not. He stopped [employer] because of his back pain (the floor exercises). Currently walking around the neighborhood a few times a week for about 30 minutes. He has DB sets from 2.5#-50# at home.   SUBJECTIVE STATEMENT: Patient states he is more pain because of shovelling snow but besides that he's been feeling much better. He said he has been doing his HEP. He has not done a gym workout since last visit.    PAIN: NPRS: 4/10 in low back, feeling tight and stiff  From initial PT evaluation on 10/17/24: NPRS: Current: 1/10,  Best: 1/10, Worst: 7.5-8/10. Pain location: across low back radiating towards both iliac crests and up to the mid thoracic spine. Gets intermittent pain down the lateral left leg.  Pain description: like someone is taking needles and poking me . Numbness/tingling: denies Aggravating factors: prolonged sitting, getting up in the  morning, getting off the floor.  Relieving factors: once it gets warmed up, being still, ibuprofen (4 helped)   PRECAUTIONS: None  WEIGHT BEARING RESTRICTIONS: No  FALLS:  Has patient fallen in last 6 months? No  PATIENT GOALS: pain relief, getting back to going to [employer]  OBJECTIVE   TREATMENT  Neuromuscular Re-education: a technique or exercise performed with the goal of improving the level of communication between the body and the brain, such as for balance, motor control, muscle activation patterns, coordination, desensitization, quality of muscle contraction, proprioception, and/or kinesthetic sense needed for successful and safe completion of functional activities.    Hooklying PPT with lat pull over, focusing on keeping abdominal muscles working, feeling for when low back arches or ribs flair and correcting it  3 x 15 with 15# DB in each hand    Supine alternating 90/90 Alternating toe touch to improve coordinated activation of deep abdominal musculature and lumbopelvic motor control  3 x 10 on each side    Quadruped Alternating hip extension on plinth for hip extensor activation and core stability  1 x 10 each side with 3 second hold 1 x 10 without hold because he had pain with his knee being in that position for extended time   Bear crawl position to improve core stability and neuromuscular coordination  1 x 10 with 10 second hold (10 seconds on, 10 seconds off)   Standing Around the world with KB core exercise to improve core stability, postural control, and controlled rotational strength  1 x 5 CW with 20# KB   Patient said it was too easy so we increased the weight   3 x 10 CW with 30# KB   3 x 10 CCW with 30# KB   Bird-dog exercise to improve core stability and activation  2 x 5 to demonstrate he understood how to do it  Added to HEP      Pt required multimodal cuing for proper technique and to facilitate improved neuromuscular control, strength, range of motion, and functional ability resulting in improved performance and form.   PATIENT EDUCATION:  Education details: Education on diagnosis, prognosis, POC, anatomy and physiology of current condition. Person educated: Patient Education method: Explanation Education comprehension: verbalized understanding and needs further education  HOME EXERCISE PROGRAM: Access Code: [email or web address] URL: [email or web address] Date: 11/07/2024 Prepared by: Vernell Mariscal  Exercises - Quadruped Diaphragmatic Breathing  - 1 x daily - 2 sets - 10 breaths - Supine Shoulder Flexion with Dowel  - 1-2 x daily - 3 sets - 10 reps - Quadruped Hip Hike on Foam  - 1 x daily - 3 sets - 5 reps - 5 seconds hold - Dead Bug  - 1-2 x daily - 7 x weekly - 3 sets - 10 reps  ASSESSMENT:  CLINICAL IMPRESSION: Today's session focused on on continuing to improve abdominals to unload spine and decrease overall pain. Patient had more pain today because of snow shoveling yesterday, he said he wasn't paying attention to his body mechanics during shoveling. He expressed feeling increased difficulty with 90/90 toe touch and bear crawl exercise indicating the need for more core exercises to improve core activation, stability, and strength. Pain feels same.as when he came in. Expressed high likelihood to do the added home exercise. Patient would benefit from continued management of limiting condition by skilled physical therapist to address remaining impairments and functional limitations to work towards stated goals and return to PLOF or maximal functional independence.   From initial PT evaluation on 10/17/2024: Patient is a 54 y.o. male referred to outpatient physical therapy with a medical diagnosis of chronic bilateral low back pain without sciatica, spondylosis without myelopathy or radiculopathy,  lumbar facet arthropathy, myofascial pain syndrome who presents with signs and symptoms consistent with chronic intermittent low back pain with acute onset  of left radicular symptoms to the foot. He had positive L slump test in flexed and extended position and difficulty with supine to sit with increased concordant pain in the low back. He had mild increased L leg pain with left lumbar sidebending and left rotation, and lacked lumbar extension AROM. He had diminished R quad and achilles DTR compared to L, and had weaker single leg heel raise on L LE compared to R. He had left lateral shift of the spine that did not appear to be relevant. Patient presents with significant pain, neural tension, ROM, motor control, knowledge, posture, muscle performance (strength/power/endurance), balance, and activity tolerance impairments that are limiting ability to complete usual activities such as sitting, lifting, traveling, prolonged positions, standing, sleeping, working, bowling, getting up in the morning, going to [employer] (HIIT workouts) daily, getting up and down from the floor (especially off his back) without difficulty. Patient will benefit from skilled physical therapy intervention to address current body structure impairments and activity limitations to improve function and work towards goals set in current POC in order to return to prior level of function or maximal functional improvement.   Mechanical sensitivities: neural tension   OBJECTIVE IMPAIRMENTS: decreased activity tolerance, decreased balance, decreased coordination, decreased endurance, decreased knowledge of condition, decreased ROM, decreased strength, and pain.   ACTIVITY LIMITATIONS: carrying, lifting, bending, sitting, standing, squatting, sleeping, transfers, bed mobility, and working out  PARTICIPATION LIMITATIONS: interpersonal relationship, driving, community activity, occupation, and working out  PERSONAL FACTORS: Past/current experiences,  Time since onset of injury/illness/exacerbation, and 3+ comorbidities:  Moderate persistent asthma; Asthma in adult without complication; Prediabetes; Essential hypertension; Abdominal hernia without obstruction and without gangrene; Acute bilateral low back pain without sciatica; Class 3 severe obesity due to excess calories with serious comorbidity and body mass index (BMI) of 45.0 to 49.9 in adult [facility]);  Pain of toe of left foot; Chronic midline low back pain without sciatica; OSA on CPAP; Discoloration of nailbeds; Motion sickness; Food allergy; Other fatigue; Nonspecific chest pain; Dependence on CPAP ventilation; and Severe obstructive sleep apnea-hypopnea syndrome on their problem list. he  has a past medical history of Abscess of axilla, left (10/12/2017), Allergies, Allergy, Arthritis, Asthma, Back pain, Eczema, Edema of both lower extremities, Food allergy, HLD (hyperlipidemia), Hypertension, Morbid obesity (HCC), Prediabetes, and Sleep apnea. he  has a past surgical history that includes Mouth surgery are also affecting patient's functional outcome.   REHAB POTENTIAL: Good  CLINICAL DECISION MAKING: Evolving/moderate complexity  EVALUATION COMPLEXITY: Moderate   GOALS: Goals reviewed with patient? No  SHORT TERM GOALS: Target date: 10/31/2024  Patient will be independent with initial home exercise program for self-management of symptoms. Baseline: Initial HEP to be provided at visit 2 as appropriate (10/17/2024); Goal status: In progress  LONG TERM GOALS: Target date: 01/09/2025  Patient will be independent with a long-term home exercise program for self-management of symptoms.  Baseline: Initial HEP to be provided at visit 2 as appropriate (10/17/2024); Goal status: In progress  2.  Patient will demonstrate improved Patient Specific Functional Scale (PSFS) to equal or greater than 8/10 to demonstrate improvement in overall condition and self-reported functional ability.  Baseline: to  be measured at visit 2 as appropriate (10/17/2024); 3/10 (10/23/24) Goal status: In progress  3.  Patient will demonstrate the ability to perform as many burpees as possible in 1 minute without increased pain to help him return to [employer] HIIT classes.  Baseline: avoiding [employer] classes due to difficulty getting up from the floor (10/17/2024);  Goal status: In progress  4.  Patient will demonstrate LE neurodynamic testing equal bilaterally and within normal limits for a healthy adult (no more than end range tension) to demonstrate good tolerance for exercise progressions and readiness to return to lifting, bending, and HIIT exercise.  Baseline: Slump test positive on L LE in flexion and extension (10/17/2024); Goal status: In progress  5.  Patient will demonstrate equal LE functional strength bilaterally in 10 consecutive single leg heel raises to demonstrate improved LE strength and myotome recovery for return to HIIT exercise.  Baseline: Severe compensations and unable to perform more than 3 heel raises on L compared to R which was able to complete 10 reps with fatigue only at end of set (10/17/2024); Goal status: In progress  6.  Patient will report NPRS equal or less than 3/10 during functional activities during the last 2 weeks to improve their abilitly to complete community, work and/or recreational activities with less limitation. Baseline: 8/10 (10/17/2024); Goal status: In progress   PLAN:  PT FREQUENCY: 2x/week  PT DURATION: 12 weeks  PLANNED INTERVENTIONS: 97164- PT Re-evaluation, 97750- Physical Performance Testing, 97110-Therapeutic exercises, 97530- Therapeutic activity, [id number]- Neuromuscular re-education, 97535- Self Care, [id number]- Manual therapy, [id number]- Electrical stimulation (unattended), 20560 (1-2 muscles), 20561 (3+ muscles)- Dry Needling, Patient/Family education, Balance training, Cryotherapy, and Moist heat.  PLAN FOR NEXT SESSION:  Continue to work on core activation and stabilizing  exercises, evaluate his knee tolerance to bearing weight on mat to help guide exercise choices. Review HEP. Consider adding banded LE exercises.  Vernell Mariscal SPT  Student physical therapist under direct supervision of licensed physical therapists during the entirety of the session.   Camie SAUNDERS. Juli, PT, DPT, Cert. MDT, PRA-C 11/07/24, 2:21 PM  [facility] [address] [place], [place] [address] P: [phone] I F: [phone]    "

## 2024-11-13 ENCOUNTER — Ambulatory Visit: Payer: Self-pay | Admitting: Physical Therapy

## 2024-11-15 NOTE — Therapy (Unsigned)
 " OUTPATIENT PHYSICAL THERAPY TREATMENT   Patient Name: Troy Olson MRN: [id number] DOB:[birth year], 54 y.o., male Today's Date: 11/15/2024  END OF SESSION:       Past Medical History:  Diagnosis Date   Abscess of axilla, left 10/12/2017   Allergies    Allergy    Arthritis    Asthma    Back pain    Eczema    Edema of both lower extremities    Food allergy    HLD (hyperlipidemia)    Hypertension    Morbid obesity (HCC)    Prediabetes    Sleep apnea    CPAP   Past Surgical History:  Procedure Laterality Date   COLONOSCOPY  01/27/2021   Normal   MOUTH SURGERY     widsom teeth   Patient Active Problem List   Diagnosis Date Noted   Dependence on CPAP ventilation 10/10/2024   Severe obstructive sleep apnea-hypopnea syndrome 10/10/2024   Other fatigue 07/26/2024   Morbid obesity with BMI of 40.0-44.9, adult (HCC) 07/26/2024   Nonspecific chest pain 07/26/2024   Other skin changes 04/10/2024   Motion sickness 04/10/2024   Food allergy 04/10/2024   Herpes zoster vaccination declined 02/22/2024   Discoloration of nailbeds 08/10/2023   Need for influenza vaccination 08/02/2023   Pain of toe of left foot 08/02/2023   Chronic midline low back pain without sciatica 08/02/2023   OSA on CPAP 08/02/2023   Abdominal hernia without obstruction and without gangrene 04/27/2023   Pain in abdomen on palpation 04/27/2023   Acute bilateral low back pain without sciatica 04/27/2023   Class 3 severe obesity due to excess calories with serious comorbidity and body mass index (BMI) of 45.0 to 49.9 in adult (HCC) 04/27/2023   Essential hypertension 01/28/2023   Prediabetes    Asthma in adult without complication 01/24/2019   Moderate persistent asthma 03/16/2012    PCP: Georgina Speaks, FNP   REFERRING PROVIDER: Trudy Duwaine BRAVO, NP   REFERRING DIAG: chronic bilateral low back pain without sciatica, spondylosis without myelopathy or radiculopathy, lumbar facet arthropathy,  myofascial pain syndrome  Rationale for Evaluation and Treatment: Rehabilitation  THERAPY DIAG:  No diagnosis found.  ONSET DATE: 2021  SUBJECTIVE:                                                                                                                                                                                           PERTINENT HISTORY:  Patient is a 54 y.o. male who presents to outpatient physical therapy with a referral for medical diagnosis chronic bilateral low back pain without sciatica, spondylosis without myelopathy or radiculopathy, lumbar facet  arthropathy, myofascial pain syndrome. This patient's chief complaints consist of chronic episodic low back pain with acute intermittent radiation down left LE leading to the following functional deficits: difficulty with sitting, lifting, traveling, prolonged positions, standing, sleeping, working, bowling, getting up in the morning, going to [employer] (HIIT workouts) daily, getting up and down from the floor (especially off his back). Relevant past medical history and comorbidities include the following: he has Moderate persistent asthma; Asthma in adult without complication; Prediabetes; Essential hypertension; Abdominal hernia without obstruction and without gangrene; Acute bilateral low back pain without sciatica; Class 3 severe obesity due to excess calories with serious comorbidity and body mass index (BMI) of 45.0 to 49.9 in adult [facility]);  Pain of toe of left foot; Chronic midline low back pain without sciatica; OSA on CPAP; Discoloration of nailbeds; Motion sickness; Food allergy; Other fatigue; Nonspecific chest pain; Dependence on CPAP ventilation; and Severe obstructive sleep apnea-hypopnea syndrome on their problem list. he  has a past medical history of Abscess of axilla, left (10/12/2017), Allergies, Allergy, Arthritis, Asthma, Back pain, Eczema, Edema of both lower extremities, Food allergy, HLD (hyperlipidemia), Hypertension,  Morbid obesity (HCC), Prediabetes, and Sleep apnea. he  has a past surgical history that includes Mouth surgery. Patient denies hx of cancer, stroke, seizures, lung problems, diabetes, unexplained weight loss, unexplained changes in bowel or bladder problems, unexplained stumbling or dropping things, osteoporosis, and spinal surgery  Exercise history: Was going to [employer] daily (did this for 1.5 years). He did not work out that intense before that. He did used to do some CrossFit in 2018 (for about 1 year). It has been about a year since he has been there. He has lost 65# and hoping that would help his back but it has not. He stopped [employer] because of his back pain (the floor exercises). Currently walking around the neighborhood a few times a week for about 30 minutes. He has DB sets from 2.5#-50# at home.   SUBJECTIVE STATEMENT: Patient states he is     PAIN: NPRS: 4/10 in low back, feeling tight and stiff  From initial PT evaluation on 10/17/24: NPRS: Current: 1/10,  Best: 1/10, Worst: 7.5-8/10. Pain location: across low back radiating towards both iliac crests and up to the mid thoracic spine. Gets intermittent pain down the lateral left leg.  Pain description: like someone is taking needles and poking me . Numbness/tingling: denies Aggravating factors: prolonged sitting, getting up in the morning, getting off the floor.  Relieving factors: once it gets warmed up, being still, ibuprofen (4 helped)   PRECAUTIONS: None  WEIGHT BEARING RESTRICTIONS: No  FALLS:  Has patient fallen in last 6 months? No  PATIENT GOALS: pain relief, getting back to going to [employer]  OBJECTIVE   TREATMENT                                                                                                                               Therapeutic  exercise: therapeutic exercises that incorporate ONE parameter at one or more areas of the body to centralize symptoms, develop strength and endurance, range of motion, and  flexibility required for successful completion of functional activities.    Standing banded Hip extension to improve glute strength    3 x 10 each side with   Neuromuscular Re-education: a technique or exercise performed with the goal of improving the level of communication between the body and the brain, such as for balance, motor control, muscle activation patterns, coordination, desensitization, quality of muscle contraction, proprioception, and/or kinesthetic sense needed for successful and safe completion of functional activities.    Hooklying PPT with lat pull over, focusing on keeping abdominal muscles working, feeling for when low back arches or ribs flair and correcting it  3 x 15 with 15# DB in each hand    Supine alternating 90/90 Alternating toe touch to improve coordinated activation of deep abdominal musculature and lumbopelvic motor control  3 x 10 on each side    Quadruped Alternating hip extension on plinth for hip extensor activation and core stability  1 x 10 each side with 3 second hold 1 x 10 without hold because he had pain with his knee being in that position for extended time   Bear crawl position to improve core stability and neuromuscular coordination  1 x 10 with 10 second hold (10 seconds on, 10 seconds off)     Standing Paloff Press with cable to improve anti-rotation control and core timing  Dead bug exercise to improve core stability and activation  2 x 5 to demonstrate he understood how to do it      Pt required multimodal cuing for proper technique and to facilitate improved neuromuscular control, strength, range of motion, and functional ability resulting in improved performance and form.   PATIENT EDUCATION:  Education details: Education on diagnosis, prognosis, POC, anatomy and physiology of current condition. Person educated: Patient Education method: Explanation Education comprehension: verbalized understanding and needs further  education  HOME EXERCISE PROGRAM: Access Code: [email or web address] URL: [email or web address] Date: 11/07/2024 Prepared by: Vernell Mariscal  Exercises - Quadruped Diaphragmatic Breathing  - 1 x daily - 2 sets - 10 breaths - Supine Shoulder Flexion with Dowel  - 1-2 x daily - 3 sets - 10 reps - Quadruped Hip Hike on Foam  - 1 x daily - 3 sets - 5 reps - 5 seconds hold - Dead Bug  - 1-2 x daily - 7 x weekly - 3 sets - 10 reps  ASSESSMENT:  CLINICAL IMPRESSION: Today's session focused on strengthening abdominals to unload the spine and decrease back pain.    From initial PT evaluation on 10/17/2024: Patient is a 54 y.o. male referred to outpatient physical therapy with a medical diagnosis of chronic bilateral low back pain without sciatica, spondylosis without myelopathy or radiculopathy, lumbar facet arthropathy, myofascial pain syndrome who presents with signs and symptoms consistent with chronic intermittent low back pain with acute onset of left radicular symptoms to the foot. He had positive L slump test in flexed and extended position and difficulty with supine to sit with increased concordant pain in the low back. He had mild increased L leg pain with left lumbar sidebending and left rotation, and lacked lumbar extension AROM. He had diminished R quad and achilles DTR compared to L, and had weaker single leg heel raise on L LE compared to R. He had left lateral shift of the spine that did not appear to be  relevant. Patient presents with significant pain, neural tension, ROM, motor control, knowledge, posture, muscle performance (strength/power/endurance), balance, and activity tolerance impairments that are limiting ability to complete usual activities such as sitting, lifting, traveling, prolonged positions, standing, sleeping, working, bowling, getting up in the morning, going to [employer] (HIIT workouts) daily, getting up and down from the floor (especially off his back) without difficulty.  Patient will benefit from skilled physical therapy intervention to address current body structure impairments and activity limitations to improve function and work towards goals set in current POC in order to return to prior level of function or maximal functional improvement.   Mechanical sensitivities: neural tension   OBJECTIVE IMPAIRMENTS: decreased activity tolerance, decreased balance, decreased coordination, decreased endurance, decreased knowledge of condition, decreased ROM, decreased strength, and pain.   ACTIVITY LIMITATIONS: carrying, lifting, bending, sitting, standing, squatting, sleeping, transfers, bed mobility, and working out  PARTICIPATION LIMITATIONS: interpersonal relationship, driving, community activity, occupation, and working out  PERSONAL FACTORS: Past/current experiences, Time since onset of injury/illness/exacerbation, and 3+ comorbidities:  Moderate persistent asthma; Asthma in adult without complication; Prediabetes; Essential hypertension; Abdominal hernia without obstruction and without gangrene; Acute bilateral low back pain without sciatica; Class 3 severe obesity due to excess calories with serious comorbidity and body mass index (BMI) of 45.0 to 49.9 in adult [facility]);  Pain of toe of left foot; Chronic midline low back pain without sciatica; OSA on CPAP; Discoloration of nailbeds; Motion sickness; Food allergy; Other fatigue; Nonspecific chest pain; Dependence on CPAP ventilation; and Severe obstructive sleep apnea-hypopnea syndrome on their problem list. he  has a past medical history of Abscess of axilla, left (10/12/2017), Allergies, Allergy, Arthritis, Asthma, Back pain, Eczema, Edema of both lower extremities, Food allergy, HLD (hyperlipidemia), Hypertension, Morbid obesity (HCC), Prediabetes, and Sleep apnea. he  has a past surgical history that includes Mouth surgery are also affecting patient's functional outcome.   REHAB POTENTIAL: Good  CLINICAL DECISION  MAKING: Evolving/moderate complexity  EVALUATION COMPLEXITY: Moderate   GOALS: Goals reviewed with patient? No  SHORT TERM GOALS: Target date: 10/31/2024  Patient will be independent with initial home exercise program for self-management of symptoms. Baseline: Initial HEP to be provided at visit 2 as appropriate (10/17/2024); Goal status: In progress  LONG TERM GOALS: Target date: 01/09/2025  Patient will be independent with a long-term home exercise program for self-management of symptoms.  Baseline: Initial HEP to be provided at visit 2 as appropriate (10/17/2024); Goal status: In progress  2.  Patient will demonstrate improved Patient Specific Functional Scale (PSFS) to equal or greater than 8/10 to demonstrate improvement in overall condition and self-reported functional ability.  Baseline: to be measured at visit 2 as appropriate (10/17/2024); 3/10 (10/23/24) Goal status: In progress  3.  Patient will demonstrate the ability to perform as many burpees as possible in 1 minute without increased pain to help him return to [employer] HIIT classes.  Baseline: avoiding [employer] classes due to difficulty getting up from the floor (10/17/2024); Goal status: In progress  4.  Patient will demonstrate LE neurodynamic testing equal bilaterally and within normal limits for a healthy adult (no more than end range tension) to demonstrate good tolerance for exercise progressions and readiness to return to lifting, bending, and HIIT exercise.  Baseline: Slump test positive on L LE in flexion and extension (10/17/2024); Goal status: In progress  5.  Patient will demonstrate equal LE functional strength bilaterally in 10 consecutive single leg heel raises to demonstrate improved LE strength and myotome recovery  for return to HIIT exercise.  Baseline: Severe compensations and unable to perform more than 3 heel raises on L compared to R which was able to complete 10 reps with fatigue only at end of set (10/17/2024); Goal  status: In progress  6.  Patient will report NPRS equal or less than 3/10 during functional activities during the last 2 weeks to improve their abilitly to complete community, work and/or recreational activities with less limitation. Baseline: 8/10 (10/17/2024); Goal status: In progress   PLAN:  PT FREQUENCY: 2x/week  PT DURATION: 12 weeks  PLANNED INTERVENTIONS: 97164- PT Re-evaluation, 97750- Physical Performance Testing, 97110-Therapeutic exercises, 97530- Therapeutic activity, [id number]- Neuromuscular re-education, 97535- Self Care, [id number]- Manual therapy, [id number]- Electrical stimulation (unattended), 20560 (1-2 muscles), 20561 (3+ muscles)- Dry Needling, Patient/Family education, Balance training, Cryotherapy, and Moist heat.  PLAN FOR NEXT SESSION:  Continue to work on core activation and stabilizing exercises, evaluate his knee tolerance to bearing weight on mat to help guide exercise choices. Review HEP. Consider adding banded LE exercises.  Vernell Mariscal SPT  Student physical therapist under direct supervision of licensed physical therapists during the entirety of the session.   Camie SAUNDERS. Juli, PT, DPT, Cert. MDT, PRA-C 11/15/24, 3:39 PM  [facility] [address] [place], [place] [address] P: [phone] I F: [phone]    "

## 2024-11-16 ENCOUNTER — Ambulatory Visit: Payer: Self-pay | Admitting: Physical Therapy

## 2024-11-21 ENCOUNTER — Ambulatory Visit: Payer: Self-pay | Admitting: Physical Therapy

## 2024-11-22 ENCOUNTER — Ambulatory Visit: Payer: Self-pay | Admitting: Dermatology

## 2024-11-23 ENCOUNTER — Ambulatory Visit: Payer: Self-pay | Admitting: Physical Therapy

## 2024-11-28 ENCOUNTER — Ambulatory Visit: Payer: Self-pay | Admitting: Physical Therapy

## 2024-11-30 ENCOUNTER — Ambulatory Visit: Payer: Self-pay | Admitting: Physical Therapy

## 2024-12-05 ENCOUNTER — Ambulatory Visit: Payer: Self-pay | Admitting: Physical Therapy

## 2024-12-07 ENCOUNTER — Ambulatory Visit: Payer: Self-pay | Admitting: Physical Therapy

## 2024-12-12 ENCOUNTER — Ambulatory Visit: Payer: Self-pay | Admitting: Physical Therapy

## 2024-12-14 ENCOUNTER — Ambulatory Visit: Payer: Self-pay | Admitting: Physical Therapy

## 2024-12-19 ENCOUNTER — Ambulatory Visit: Payer: Self-pay | Admitting: Physical Therapy

## 2024-12-21 ENCOUNTER — Ambulatory Visit: Payer: Self-pay | Admitting: Physical Therapy

## 2024-12-26 ENCOUNTER — Ambulatory Visit: Payer: Self-pay | Admitting: Physical Therapy

## 2024-12-28 ENCOUNTER — Ambulatory Visit: Payer: Self-pay | Admitting: Physical Therapy

## 2025-01-02 ENCOUNTER — Ambulatory Visit: Payer: Self-pay | Admitting: Physical Therapy

## 2025-01-04 ENCOUNTER — Ambulatory Visit: Payer: Self-pay | Admitting: Physical Therapy

## 2025-01-16 ENCOUNTER — Ambulatory Visit: Payer: Self-pay | Admitting: Nurse Practitioner
